# Patient Record
Sex: Female | Born: 1935 | Race: White | Hispanic: No | Marital: Married | Smoking: Never smoker
Health system: Southern US, Community
[De-identification: ages and names within clinical notes are randomized; demographics above are authoritative.]

## PROBLEM LIST (undated history)

## (undated) DIAGNOSIS — E785 Hyperlipidemia, unspecified: Secondary | ICD-10-CM

## (undated) DIAGNOSIS — K219 Gastro-esophageal reflux disease without esophagitis: Secondary | ICD-10-CM

## (undated) DIAGNOSIS — I10 Essential (primary) hypertension: Secondary | ICD-10-CM

## (undated) DIAGNOSIS — C679 Malignant neoplasm of bladder, unspecified: Secondary | ICD-10-CM

## (undated) DIAGNOSIS — F329 Major depressive disorder, single episode, unspecified: Secondary | ICD-10-CM

## (undated) DIAGNOSIS — R569 Unspecified convulsions: Secondary | ICD-10-CM

## (undated) DIAGNOSIS — I619 Nontraumatic intracerebral hemorrhage, unspecified: Secondary | ICD-10-CM

## (undated) DIAGNOSIS — M1990 Unspecified osteoarthritis, unspecified site: Secondary | ICD-10-CM

## (undated) DIAGNOSIS — F0390 Unspecified dementia without behavioral disturbance: Secondary | ICD-10-CM

## (undated) DIAGNOSIS — F32A Depression, unspecified: Secondary | ICD-10-CM

## (undated) DIAGNOSIS — I639 Cerebral infarction, unspecified: Secondary | ICD-10-CM

## (undated) DIAGNOSIS — C801 Malignant (primary) neoplasm, unspecified: Secondary | ICD-10-CM

## (undated) DIAGNOSIS — Z972 Presence of dental prosthetic device (complete) (partial): Secondary | ICD-10-CM

## (undated) HISTORY — DX: Malignant neoplasm of bladder, unspecified: C67.9

## (undated) HISTORY — PX: BACK SURGERY: SHX140

## (undated) HISTORY — PX: CHOLECYSTECTOMY: SHX55

## (undated) HISTORY — DX: Hyperlipidemia, unspecified: E78.5

## (undated) HISTORY — PX: ABDOMINAL HYSTERECTOMY: SHX81

---

## 1997-11-30 HISTORY — PX: BOWEL RESECTION: SHX1257

## 2007-06-06 ENCOUNTER — Ambulatory Visit: Payer: Self-pay | Admitting: Ophthalmology

## 2007-06-06 ENCOUNTER — Other Ambulatory Visit: Payer: Self-pay

## 2007-09-05 ENCOUNTER — Ambulatory Visit: Payer: Self-pay | Admitting: Ophthalmology

## 2007-09-27 ENCOUNTER — Ambulatory Visit: Payer: Self-pay | Admitting: Family Medicine

## 2007-10-31 ENCOUNTER — Ambulatory Visit: Payer: Self-pay | Admitting: Ophthalmology

## 2011-12-07 ENCOUNTER — Ambulatory Visit: Payer: Self-pay | Admitting: Obstetrics and Gynecology

## 2011-12-07 LAB — BASIC METABOLIC PANEL
BUN: 12 mg/dL (ref 7–18)
Calcium, Total: 8.9 mg/dL (ref 8.5–10.1)
Co2: 27 mmol/L (ref 21–32)
Creatinine: 0.57 mg/dL — ABNORMAL LOW (ref 0.60–1.30)
EGFR (African American): >60
EGFR (Non-African Amer.): >60
Glucose: 84 mg/dL (ref 65–99)
Osmolality: 282 (ref 275–301)
Potassium: 3.7 mmol/L (ref 3.5–5.1)

## 2011-12-07 LAB — CBC
HGB: 13.2 g/dL (ref 12.0–16.0)
MCH: 31.7 pg (ref 26.0–34.0)
MCV: 96 fL (ref 80–100)
RBC: 4.16 10*6/uL (ref 3.80–5.20)
WBC: 5.1 10*3/uL (ref 3.6–11.0)

## 2011-12-14 ENCOUNTER — Ambulatory Visit: Payer: Self-pay | Admitting: Obstetrics and Gynecology

## 2011-12-15 LAB — HEMOGLOBIN: HGB: 11.2 g/dL — ABNORMAL LOW (ref 12.0–16.0)

## 2012-05-03 ENCOUNTER — Ambulatory Visit: Payer: Self-pay | Admitting: Family Medicine

## 2013-11-07 ENCOUNTER — Ambulatory Visit: Payer: Self-pay | Admitting: Neurology

## 2013-11-30 HISTORY — PX: BLADDER SURGERY: SHX569

## 2013-12-30 ENCOUNTER — Emergency Department: Payer: Self-pay | Admitting: Emergency Medicine

## 2013-12-30 LAB — URINALYSIS, COMPLETE
BILIRUBIN, UR: NEGATIVE
Glucose,UR: NEGATIVE mg/dL (ref 0–75)
Ketone: NEGATIVE
LEUKOCYTE ESTERASE: NEGATIVE
Nitrite: NEGATIVE
Ph: 6 (ref 4.5–8.0)
Protein: 30
Specific Gravity: 1.008 (ref 1.003–1.030)
Squamous Epithelial: 4
WBC UR: 15 /HPF (ref 0–5)

## 2013-12-30 LAB — CBC
HCT: 40.8 % (ref 35.0–47.0)
HGB: 13.6 g/dL (ref 12.0–16.0)
MCH: 31.9 pg (ref 26.0–34.0)
MCHC: 33.4 g/dL (ref 32.0–36.0)
MCV: 96 fL (ref 80–100)
Platelet: 193 10*3/uL (ref 150–440)
RBC: 4.27 10*6/uL (ref 3.80–5.20)
RDW: 12.8 % (ref 11.5–14.5)
WBC: 3.5 10*3/uL — AB (ref 3.6–11.0)

## 2013-12-30 LAB — COMPREHENSIVE METABOLIC PANEL
ANION GAP: 5 — AB (ref 7–16)
Albumin: 3.7 g/dL (ref 3.4–5.0)
Alkaline Phosphatase: 77 U/L
BILIRUBIN TOTAL: 0.5 mg/dL (ref 0.2–1.0)
BUN: 11 mg/dL (ref 7–18)
CALCIUM: 8.7 mg/dL (ref 8.5–10.1)
Chloride: 105 mmol/L (ref 98–107)
Co2: 29 mmol/L (ref 21–32)
Creatinine: 0.82 mg/dL (ref 0.60–1.30)
EGFR (Non-African Amer.): >60
GLUCOSE: 99 mg/dL (ref 65–99)
Osmolality: 277 (ref 275–301)
POTASSIUM: 3.5 mmol/L (ref 3.5–5.1)
SGOT(AST): 26 U/L (ref 15–37)
SGPT (ALT): 29 U/L (ref 12–78)
Sodium: 139 mmol/L (ref 136–145)
TOTAL PROTEIN: 7.1 g/dL (ref 6.4–8.2)

## 2014-01-03 ENCOUNTER — Ambulatory Visit: Payer: Self-pay | Admitting: Urology

## 2014-01-04 ENCOUNTER — Ambulatory Visit: Payer: Self-pay | Admitting: Urology

## 2014-01-09 LAB — PATHOLOGY REPORT

## 2014-05-02 ENCOUNTER — Ambulatory Visit: Payer: Self-pay | Admitting: Urology

## 2014-05-02 LAB — BASIC METABOLIC PANEL
Anion Gap: 4 — ABNORMAL LOW (ref 7–16)
BUN: 11 mg/dL (ref 7–18)
CALCIUM: 8.4 mg/dL — AB (ref 8.5–10.1)
CO2: 29 mmol/L (ref 21–32)
Chloride: 107 mmol/L (ref 98–107)
Creatinine: 0.63 mg/dL (ref 0.60–1.30)
EGFR (African American): >60
GLUCOSE: 98 mg/dL (ref 65–99)
Osmolality: 279 (ref 275–301)
Potassium: 4 mmol/L (ref 3.5–5.1)
SODIUM: 140 mmol/L (ref 136–145)

## 2014-05-02 LAB — CBC
HCT: 40.1 % (ref 35.0–47.0)
HGB: 13.1 g/dL (ref 12.0–16.0)
MCH: 31.5 pg (ref 26.0–34.0)
MCHC: 32.7 g/dL (ref 32.0–36.0)
MCV: 96 fL (ref 80–100)
Platelet: 196 10*3/uL (ref 150–440)
RBC: 4.17 10*6/uL (ref 3.80–5.20)
RDW: 13 % (ref 11.5–14.5)
WBC: 4.7 10*3/uL (ref 3.6–11.0)

## 2014-05-11 ENCOUNTER — Ambulatory Visit: Payer: Self-pay | Admitting: Urology

## 2014-05-15 LAB — PATHOLOGY REPORT

## 2014-08-22 DIAGNOSIS — G309 Alzheimer's disease, unspecified: Secondary | ICD-10-CM

## 2014-08-22 DIAGNOSIS — I6381 Other cerebral infarction due to occlusion or stenosis of small artery: Secondary | ICD-10-CM | POA: Insufficient documentation

## 2014-08-22 DIAGNOSIS — K589 Irritable bowel syndrome without diarrhea: Secondary | ICD-10-CM | POA: Insufficient documentation

## 2014-08-22 DIAGNOSIS — F0150 Vascular dementia without behavioral disturbance: Secondary | ICD-10-CM | POA: Insufficient documentation

## 2014-08-22 DIAGNOSIS — E782 Mixed hyperlipidemia: Secondary | ICD-10-CM | POA: Insufficient documentation

## 2014-08-22 DIAGNOSIS — J302 Other seasonal allergic rhinitis: Secondary | ICD-10-CM | POA: Insufficient documentation

## 2014-08-22 DIAGNOSIS — C679 Malignant neoplasm of bladder, unspecified: Secondary | ICD-10-CM | POA: Insufficient documentation

## 2014-08-22 DIAGNOSIS — I10 Essential (primary) hypertension: Secondary | ICD-10-CM | POA: Insufficient documentation

## 2014-08-22 DIAGNOSIS — F0280 Dementia in other diseases classified elsewhere without behavioral disturbance: Secondary | ICD-10-CM

## 2014-12-13 LAB — URINALYSIS, COMPLETE
Bacteria: NONE SEEN
Bilirubin,UR: NEGATIVE
Glucose,UR: NEGATIVE mg/dL (ref 0–75)
Ketone: NEGATIVE
Leukocyte Esterase: NEGATIVE
NITRITE: NEGATIVE
Ph: 6 (ref 4.5–8.0)
Protein: NEGATIVE
Specific Gravity: 1.012 (ref 1.003–1.030)
Squamous Epithelial: 1
WBC UR: 1 /HPF (ref 0–5)

## 2014-12-13 LAB — CBC
HCT: 43.5 % (ref 35.0–47.0)
HGB: 14.1 g/dL (ref 12.0–16.0)
MCH: 31.3 pg (ref 26.0–34.0)
MCHC: 32.4 g/dL (ref 32.0–36.0)
MCV: 97 fL (ref 80–100)
PLATELETS: 240 10*3/uL (ref 150–440)
RBC: 4.5 10*6/uL (ref 3.80–5.20)
RDW: 13 % (ref 11.5–14.5)
WBC: 5 10*3/uL (ref 3.6–11.0)

## 2014-12-13 LAB — PROTIME-INR
INR: 0.9
PROTHROMBIN TIME: 12.3 s (ref 11.5–14.7)

## 2014-12-13 LAB — COMPREHENSIVE METABOLIC PANEL
ALK PHOS: 89 U/L
ANION GAP: 3 — AB (ref 7–16)
AST: 32 U/L (ref 15–37)
Albumin: 3.6 g/dL (ref 3.4–5.0)
BUN: 14 mg/dL (ref 7–18)
Bilirubin,Total: 0.3 mg/dL (ref 0.2–1.0)
CALCIUM: 8.7 mg/dL (ref 8.5–10.1)
Chloride: 107 mmol/L (ref 98–107)
Co2: 32 mmol/L (ref 21–32)
Creatinine: 0.77 mg/dL (ref 0.60–1.30)
EGFR (African American): >60
EGFR (Non-African Amer.): >60
Glucose: 85 mg/dL (ref 65–99)
OSMOLALITY: 283 (ref 275–301)
Potassium: 3.8 mmol/L (ref 3.5–5.1)
SGPT (ALT): 30 U/L
SODIUM: 142 mmol/L (ref 136–145)
TOTAL PROTEIN: 7.6 g/dL (ref 6.4–8.2)

## 2014-12-13 LAB — CK-MB: CK-MB: 2.5 ng/mL (ref 0.5–3.6)

## 2014-12-13 LAB — TROPONIN I
TROPONIN-I: 0.07 ng/mL — AB
Troponin-I: 0.07 ng/mL — ABNORMAL HIGH

## 2014-12-14 ENCOUNTER — Observation Stay: Payer: Self-pay | Admitting: Emergency Medicine

## 2014-12-14 LAB — CBC WITH DIFFERENTIAL/PLATELET
Basophil #: 0.1 10*3/uL (ref 0.0–0.1)
Basophil %: 1.1 %
Eosinophil #: 0.2 10*3/uL (ref 0.0–0.7)
Eosinophil %: 2.6 %
HCT: 39.6 % (ref 35.0–47.0)
HGB: 13 g/dL (ref 12.0–16.0)
LYMPHS ABS: 1.9 10*3/uL (ref 1.0–3.6)
LYMPHS PCT: 31.5 %
MCH: 31.8 pg (ref 26.0–34.0)
MCHC: 32.8 g/dL (ref 32.0–36.0)
MCV: 97 fL (ref 80–100)
MONOS PCT: 12.3 %
Monocyte #: 0.7 x10 3/mm (ref 0.2–0.9)
Neutrophil #: 3.1 10*3/uL (ref 1.4–6.5)
Neutrophil %: 52.5 %
PLATELETS: 208 10*3/uL (ref 150–440)
RBC: 4.08 10*6/uL (ref 3.80–5.20)
RDW: 12.9 % (ref 11.5–14.5)
WBC: 5.9 10*3/uL (ref 3.6–11.0)

## 2014-12-14 LAB — COMPREHENSIVE METABOLIC PANEL
ALK PHOS: 69 U/L
Albumin: 3.3 g/dL — ABNORMAL LOW (ref 3.4–5.0)
Anion Gap: 6 — ABNORMAL LOW (ref 7–16)
BILIRUBIN TOTAL: 0.4 mg/dL (ref 0.2–1.0)
BUN: 11 mg/dL (ref 7–18)
CREATININE: 0.71 mg/dL (ref 0.60–1.30)
Calcium, Total: 8.4 mg/dL — ABNORMAL LOW (ref 8.5–10.1)
Chloride: 109 mmol/L — ABNORMAL HIGH (ref 98–107)
Co2: 26 mmol/L (ref 21–32)
Glucose: 92 mg/dL (ref 65–99)
Osmolality: 280 (ref 275–301)
Potassium: 3.8 mmol/L (ref 3.5–5.1)
SGOT(AST): 22 U/L (ref 15–37)
SGPT (ALT): 25 U/L
Sodium: 141 mmol/L (ref 136–145)
Total Protein: 6.5 g/dL (ref 6.4–8.2)

## 2014-12-14 LAB — TROPONIN I: Troponin-I: 0.07 ng/mL — ABNORMAL HIGH

## 2014-12-14 LAB — CK-MB: CK-MB: 2.7 ng/mL (ref 0.5–3.6)

## 2015-01-02 DIAGNOSIS — R55 Syncope and collapse: Secondary | ICD-10-CM | POA: Insufficient documentation

## 2015-01-02 DIAGNOSIS — Z86718 Personal history of other venous thrombosis and embolism: Secondary | ICD-10-CM | POA: Insufficient documentation

## 2015-01-02 DIAGNOSIS — R001 Bradycardia, unspecified: Secondary | ICD-10-CM | POA: Insufficient documentation

## 2015-01-02 DIAGNOSIS — I340 Nonrheumatic mitral (valve) insufficiency: Secondary | ICD-10-CM | POA: Insufficient documentation

## 2015-03-19 ENCOUNTER — Ambulatory Visit
Admit: 2015-03-19 | Disposition: A | Discharge status: Home / Self Care | Payer: Self-pay | Attending: Urology | Admitting: Urology

## 2015-03-21 LAB — URINE CULTURE

## 2015-03-23 NOTE — Op Note (Signed)
PATIENT NAME:  Gabrielle Crosby, Gabrielle Crosby MR#:  [id number] DATE OF BIRTH:  [birth year]  DATE OF PROCEDURE:  05/11/2014  PREOPERATIVE DIAGNOSIS: Recurrent bladder tumor.   POSTOPERATIVE DIAGNOSIS: Recurrent bladder tumor.   PROCEDURE: Cystoscopy with small bladder tumor removal with fulguration.   SURGEON: Richard D. Elnoria Howard, DO  ANESTHESIA: General.   COMPLICATIONS: None.   SPECIMEN: One.   DESCRIPTION OF PROCEDURE: The patient was sterilely prepped and draped in supine lithotomy position and under good relaxation from a general anesthetic. A timeout is done and is agreed to by all parties. The patient then has a cystoscopy done. Tumor seen in the right lateral wall. It was biopsied with a cold cutting biopsy forceps, fulgurated with a Bugbee electrode so that there is no bleeding. The area is extensively fulgurated. The ureters are identified. There is some cystitis cystica in the bladder which is nondiagnostic for tumor. So, the patient tolerates the procedure well, and bladder is emptied and 30 mL of 0.5% Marcaine is placed in the bladder, and she is sent to recovery in satisfactory condition with a B and O suppository.    ____________________________ Janice Coffin. Elnoria Howard, [employer] rdh:lb D: 05/11/2014 12:53:04 ET T: 05/11/2014 13:05:28 ET JOB#: [id number]  cc: Janice Coffin. Elnoria Howard, DO, <Dictator> RICHARD D HART DO ELECTRONICALLY SIGNED 05/11/2014 15:42

## 2015-03-23 NOTE — Op Note (Signed)
PATIENT NAME:  Gabrielle Crosby, Gabrielle Crosby MR#:  [id number] DATE OF BIRTH:  08/22/1933  DATE OF PROCEDURE:  01/04/2014  PREOPERATIVE DIAGNOSIS: Right lateral wall bladder tumor.   POSTOPERATIVE DIAGNOSIS: Right lateral wall bladder tumor.   PROCEDURE: Cystoscopy with transurethral resection of bladder tumor and instillation of mitomycin-C 40 mg in 40 mL.  SURGEON: Nevae Pinnix D. Elnoria Howard, DO  DESCRIPTION OF PROCEDURE: With the patient sterilely prepped and draped in supine lithotomy position under good relaxation from a general anesthetic, and after an appropriate timeout, we began the procedure. Female, we used a saline resectoscope utilizing direct vision to enter the bladder. The obturator was then removed from the resectoscope. The 43 French resectoscope was then utilized with constant irrigation and suction. Normal saline irrigation was utilized. Two tumors were seen, one just proximal to the ureteral orifice on the interureteric ridge. 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 Then, a large carcinoma about 2.5 cm to 3 cm was seen lateral to this, behind the bladder neck, about halfway cephalad on the right lateral wall. It is resected easily. It does not appear to be too deep. I do penetrate into fat. I do get muscle. The areas are thoroughly coagulated. The irrigation is clear, so we used some mitomycin-C after irrigating all tumor fragments out of the bladder with suction irrigation. She is sent to recovery in satisfactory condition after the mitomycin is placed through a 24 [place] 2-way Foley. Irrigation was clear before we put in the mitomycin-C. The Foley is plugged. She will be seen in recovery and have it unplugged within the next hour.   ____________________________ Janice Coffin. Elnoria Howard, DO rdh:lb D: 01/04/2014 12:13:16 ET T: 01/04/2014 13:03:18 ET JOB#: [id number]  cc: Janice Coffin. Elnoria Howard, DO, <Dictator> Milianna Ericsson D Obera Stauch  DO ELECTRONICALLY SIGNED 02/02/2014 14:25

## 2015-03-24 NOTE — H&P (Signed)
PATIENT NAME:  Gabrielle Crosby, Gabrielle Crosby MR#:  [id number] DATE OF BIRTH:  [birth year]  DATE OF ADMISSION:  12/14/2011  CHIEF COMPLAINT: Pelvic organ prolapse (large rectocele and enterocele).   HISTORY: Gabrielle Crosby is a 81 year old married white female, para 2-0-0-2, status post total abdominal hysterectomy, status post laparoscopic bladder neck suspension in 1998, status post exploratory laparotomy with bilateral salpingo-oophorectomy for suspected small bowel obstruction in 1998, who presents for surgical evaluation of pelvic organ prolapse. The patient has had greater than a month's worth of symptoms of low pelvic pain and pressure sensation. She has had some urinary frequency, approximately 15 times a day without nocturia. She has mild stress incontinence symptoms and some urgency symptoms. The patient does wear pads for protection. She denies history of incomplete bladder emptying. The patient does report changes in bowel function including incomplete emptying of her bowels. She does not completely evacuate. She does have to splint for bowel movements. Work-up has revealed a large rectocele and enterocele.   PAST MEDICAL HISTORY:  1. Hypertension.  2. Hyperlipidemia.  3. Irritable bowel syndrome.  4. Deep vein thrombosis.  5. Seasonal allergic rhinitis.   PAST SURGICAL HISTORY:  1. Total vaginal hysterectomy.  2. Bladder tack.  3. Exploratory laparotomy with bilateral salpingo-oophorectomy.  4. Cholecystectomy.   PAST OB HISTORY: Para 2-0-0-2, spontaneous vaginal delivery x2.   PAST GYN HISTORY: No history of abnormal Pap smears.   ALLERGIES:  No known drug allergies.  MEDICATIONS:  1. Atenolol 50 mg daily. 2. Flonase 2  sprays both nostrils once a day.  3. Lovastatin 20 mg a day.   FAMILY HISTORY: Negative for cancer of the colon, ovary, or breast. There is heart disease in mom and two brothers.   SOCIAL HISTORY: The patient does not smoke, does not drink. 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  REVIEW OF SYSTEMS: The patient denies recent illness. She denies recent reactive airway disease. She denies coagulopathy.   PHYSICAL EXAMINATION:  VITAL SIGNS: Height 5 feet 2 inches, weight 128, blood pressure 158/68, heart rate 63.   GENERAL: The patient is a pleasant white female in no acute distress. She is alert and oriented. Affect is appropriate.   HEENT: Oropharynx is clear.   NECK: Supple. No thyromegaly or adenopathy.   LUNGS: Clear.   HEART: Regular rate and rhythm.   BACK: Without CVA tenderness.   ABDOMEN: Soft and nontender. No organomegaly or pelvic masses. There are two well-healed midline incisions in the lower abdomen.   PELVIC: External genitalia with mild atrophic changes. BUS notable for a small urethral caruncle. The introitus is notable for a bulge being present when the labia are spread. Rectocele is present on clinical exam with Valsalva worsening the rectocele. There appears to be good support of the urethrovesical angle. A first-degree cystocele is present. The depth of the vagina appears to be slightly foreshortened. Bimanual exam reveals no masses.   EXTREMITIES: Without clubbing, cyanosis, or edema.   SKIN: Without rash.   IMPRESSION:  1. Pelvic organ prolapse with symptomatic large rectocele and enterocele.  2. History of laparoscopic bladder neck suspension with history of complications requiring another surgery.  3. History of postoperative deep vein thrombosis.  4. Currently with good distal bladder neck support and presence of a small cystocele.   PLAN: Posterior colporrhaphy with enterocele ligation. Date of surgery is 12/14/2011.   CONSENT NOTE: [name] is to undergo vaginal surgery to repair rectocele and enterocele. Posterior colporrhaphy and enterocele  ligation is planned. She is understanding of the planned procedures and is aware of and is accepting of all surgical risks which include but are not  limited to bleeding, infection, pelvic organ injury with need for repair, blood clot disorders, anesthesia risks, and death. All of her questions are answered. Informed consent is given. The patient is ready and willing to proceed with surgery as scheduled.   ____________________________ Alanda Slim DeFrancesco, MD mad:cbb D: 12/09/2011 17:17:12 ET T: 12/09/2011 18:09:35 ET JOB#: [id number]  cc: Hassell Done A. DeFrancesco, MD, <Dictator> Alanda Slim DEFRANCESCO MD ELECTRONICALLY SIGNED 12/28/2011 12:43

## 2015-03-24 NOTE — Op Note (Signed)
PATIENT NAME:  Gabrielle Crosby, Gabrielle Crosby MR#:  [id number] DATE OF BIRTH:  08-Aug-1933  DATE OF PROCEDURE:  12/14/2011  PREOPERATIVE DIAGNOSES: Symptomatic pelvic relaxation (rectocele, enterocele).   POSTOPERATIVE DIAGNOSES: Symptomatic pelvic relaxation (rectocele, enterocele).   OPERATIVE PROCEDURE: Posterior colporrhaphy with enterocele ligation.   SURGEON: Alanda Slim. Ani Deoliveira, MD   FIRST ASSISTANT: Shelby Mattocks. Knowles-Jonas, MD    ANESTHESIA: General endotracheal.   INDICATIONS: The patient is a 81 year old white female, status post hysterectomy in the past, who presents for surgical management of symptomatic pelvic relaxation in the form of a rectocele and enterocele which has been causing pelvic pressure symptoms.   FINDINGS AT SURGERY: Findings at surgery revealed a moderate rectocele and enterocele.   DESCRIPTION OF PROCEDURE: The patient was brought to the Operating Room where she was placed in the supine position. General endotracheal anesthesia was induced without difficulty. She was placed in a dorsal lithotomy position, and a Betadine perineal-intravaginal prep and drape was performed in the standard fashion. The patient had been previously treated with Jobst hose and sequential compression device stockings. The clinical findings were noted on exam under anesthesia. Allis clamps were used to grasp the angles at the introitus. A diamond-shaped wedge tissue comprising the posterior fourchette involving both vaginal and perineal aspects was excised with a scalpel. The vaginal mucosa was then undermined with Metzenbaum scissors and incised in the midline. Allis-Adair retractors were used to facilitate exposure and dissection. This process was carried out up to the apex of the vagina. Sequentially, the perirectal fascia was dissected off of the vaginal sidewall using sharp and blunt dissection in a standard technique. The enterocele sac was identified high in the vagina. Eventually the enterocele  sac was opened and explored. This exploration verified no significant adhesions present with the bowel. The enterocele sac was closed using a pursestring stitch of 0 Vicryl suture. Next, the rectocele was reduced using horizontal mattress stitches of 0 Vicryl. Good perineal shelf was created. Vaginal mucosa was trimmed and was reapproximated in the midline using 2-0 chromic sutures. A vaginal pack of Kerlix and Premarin was then placed. A Foley catheter was placed. Initially, some hematuria was noted which subsequently resolved. Upon completion of the procedure, the patient was awakened, extubated, and taken to the recovery room in satisfactory condition.   ESTIMATED BLOOD LOSS: 25 mL.   COMPLICATIONS: None.   IV FLUIDS INFUSED: 750 mL.   ____________________________ Alanda Slim. Laramie Meissner, MD mad:cbb D: 12/15/2011 11:14:13 ET T: 12/15/2011 13:36:24 ET JOB#: [id number]  cc: Hassell Done A. Abrar Koone, MD, <Dictator> Shelby Mattocks. Georga Bora, MD Alanda Slim Georganne Siple MD ELECTRONICALLY SIGNED 12/28/2011 12:43

## 2015-03-27 ENCOUNTER — Ambulatory Visit
Admit: 2015-03-27 | Disposition: A | Discharge status: Home / Self Care | Payer: Self-pay | Attending: Urology | Admitting: Urology

## 2015-03-31 NOTE — H&P (Signed)
PATIENT NAME:  Gabrielle Crosby, BIDWELL MR#:  [id number] DATE OF BIRTH:  30-Jul-1933  DATE OF ADMISSION:  12/13/2014  PRIMARY CARE PHYSICIAN:  DR Ilene Qua.   The patient is a 81 year old Caucasian female with history of dementia, history of hypertension, hyperlipidemia, who presents to the hospital with complaints of dizziness and some memory loss and problems such as dizziness, lightheadedness, feeling like she was going to pass out as well as some memory loss.  According to the patient's husband as well as the patient, she has been having dizziness and lightheadedness episodes for the past 1 week. The episodes would come and go and they would last maybe 30 to 40 seconds.  The last one which was today was very intense, apparently came on while she was sitting on the couch and she just leaned her head back, but she did not pass out.  Post dizziness episode, said she was a little bit more confused than usual.  She also feels somewhat presyncopal during those episodes, but no full syncope was ever noted.  Here in the Emergency Room, she also had an episode which lasted just 2 seconds.  During this episode, I came into her room and her heart rate was in the 40s.   PAST MEDICAL HISTORY: Significant for history of bilateral cataract removal, history of hypertension, hyperlipidemia, irritable bowel syndrome history of DVT, history of recurrent bladder tumor status post fulguration in the past, history of seasonal allergic rhinitis, total vaginal hysterectomy, bladder tack, history of rectocele, enterocele operation,  laparotomy with bilateral salpingo-oophorectomy and cholecystectomy.   ALLERGIES: None.   MEDICATIONS: According to medical records, the patient is on aspirin 81 mg daily, atenolol 50 mg daily, lovastatin 10 mg p.o. daily at bedtime.   FAMILY HISTORY: Negative for colon or breast cancer, heart disease in the patient's mother as well as 2 brothers.   SOCIAL HISTORY:  The patient has no history of smoking  or alcohol abuse. 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   REVIEW OF SYSTEMS:  Admits to lightheadedness and dizziness, feeling presyncopal episodes, feeling confused and near syncopal during those episodes.   CONSTITUTIONAL:  Denies any fevers, chills, fatigue, weakness, pains, weight loss or gain.  EYES: Denies any blurry vision, double vision or glaucoma.  ENT: Denies any tinnitus, allergies, epistaxis, sinus pain, dentures, difficulty swallowing.  RESPIRATORY: Denies any cough, wheezes, asthma, COPD.  CARDIOVASCULAR:  Denies chest pains, orthopnea, edema, palpitations.  GASTROINTESTINAL: Denies nausea, vomiting, diarrhea or constipation.   GENITOURINARY: Denies dysuria, hematuria, frequency, incontinence.  ENDOCRINOLOGY: Denies any polydipsia, nocturia, thyroid problems, heat or cold intolerance or thirst.  HEMATOLOGIC: Denies anemia, easy bruising or bleeding, swollen glands.  SKIN: Denies any acne, rashes, change in moles.  MUSCULOSKELETAL: Denies arthritis, cramps, swelling, gout .  NEUROLOGIC: Denies numbness, epilepsy or tremor.  PSYCHIATRIC: Denies anxiety as well as depression.  PHYSICAL EXAMINATION: VITAL SIGNS: On arrival to the hospital, the patient's temperature was 98, pulse was 74, respiratory rate was 18, blood pressure 160/77, saturation was 98% on room air.  GENERAL: This is a well-developed, well-nourished, thin, pale, Caucasian female in no significant distress, sitting on the stretcher.  HEENT: Her pupils are equal and reactive to light. Extraocular muscles intact. No icterus or conjunctivitis. Has normal hearing. No pharyngeal erythema. Mucosa is moist.  NECK: No masses. Supple, nontender. Thyroid not enlarged. No adenopathy. No JVD or carotid bruits bilaterally. Full range of motion.  LUNGS: Clear to auscultation in all fields. No rales,  rhonchi, diminished breath sounds or wheezing. No labored respirations, increased effort, dullness to percussion or  overt respiratory distress.  CARDIOVASCULAR: S1, S2 appreciated. Rhythm is regular. PMI not lateralized. Chest is nontender to palpation. 1+ pedal pulses. No lower extremity edema, calf tenderness or cyanosis was noted.  ABDOMEN: Soft, nontender. Bowel sounds are present. No hepatosplenomegaly or masses were noted.  RECTAL: Deferred.  MUSCLE STRENGTH: Able to move all extremities. No cyanosis, degenerative joint disease or kyphosis. Gait was not tested.  SKIN: Did not reveal any rashes, lesions, erythema, nodularity, induration. It was warm and dry to palpation.  LYMPHATIC: No adenopathy in the cervical region.  NEUROLOGICAL: Cranial nerves grossly intact. Sensory is intact. No dysarthria or aphasia. PSYCHIATRIC: The patient is alert and oriented to time, person and place. Cooperative. Memory is somewhat impaired, but no significant confusion, agitation or depression was noted.   LABORATORY DATA: BMP within normal limits. Liver enzymes were normal. Cardiac enzymes:  Troponin is elevated at 0.07. CBC: White blood cell count was 5.0, hemoglobin was 14.1, platelet count was 240,000. Coagulation panel was unremarkable. Urinalysis:  Yellow clear urine.  Negative for glucose, bilirubin or ketones. Specific gravity 1.012, pH was 6.0, 1+ blood; negative for protein, nitrites or leukocyte esterase, 6 red blood cells, 1 white blood cell, no bacteria, 1 epithelial cell as well as mucus was present.   RADIOLOGIC STUDIES: CT scan of head without contrast 12/13/2014 showed no acute intracranial pathology; chronic microvascular disease and cerebral atrophy was noted. EKG showed normal sinus rhythm at 63 beats per minutes, normal axis, no acute ST-T changes, minimal voltage criteria for LVH, which may be normal variant according to EKG criteria.   ASSESSMENT AND PLAN:  1. Dizziness, presyncopal episode.  Etiology at this time could be related to bradycardia episodes.  Cannot rule out cardiac ischemia. We will get  neurology and cardiology involved.  We will get echocardiogram, carotid ultrasound, orthostatic vital signs. We will hold the patient's atenolol.  We will get TSH.  2. Elevated troponin, questionable ischemic versus demand related.  We will get echocardiogram and cardiology consultation.  We will start the patient on aspirin and we will start her on nitroglycerin topically, as well as a beta blockers at this time.  3. Hyperlipidemia. Continue lovastatin. We will get lipid panel in the morning.  4. Hypertension. Will hold atenolol. We will start the patient on lisinopril.   TIME SPENT: 50 minutes.    ____________________________ Theodoro Grist, MD rv:by D: 12/13/2014 19:57:10 ET T: 12/13/2014 21:01:17 ET JOB#: [id number]  cc: Theodoro Grist, MD, <Dictator> Unknown cc Fifi Schindler MD ELECTRONICALLY SIGNED 12/20/2014 10:32

## 2015-03-31 NOTE — Discharge Summary (Signed)
PATIENT NAME:  Gabrielle Crosby, Gabrielle Crosby MR#:  [id number] DATE OF BIRTH:  16-May-1933  DATE OF ADMISSION:  12/14/2014 DATE OF DISCHARGE:  12/15/2014  ADMITTING DIAGNOSIS:  1. Near syncope and brief episode of confusion, possibly related to bradycardia and near syncope. No evidence of seizure.  2. Bradycardia, felt to be due to atenolol therapy. Heart rate now stable.  3. History of bilateral cataract removal.  4. History of hypertension.  5. Hyperlipidemia.  6. Irritable bowel syndrome.  7. History of deep vein thrombosis.  8. History of recurrent bladder tumor status post fulguration.  9. History of seasonal allergies.  10. History of total vaginal hysterectomy, bladder tack.  11. Bilateral salpingo-oophorectomy.  12. Status post cholecystectomy.   CONSULTANTS: Dr. Tamala Julian as well as Dr. Nehemiah Massed.   PERTINENT LABORATORY EVALUATIONS: Admitting glucose 85, BUN 14, creatinine 0.77, sodium 142, potassium 3.8, chloride 107, CO2 of 32, calcium 8.6. LFTs are normal. Troponin 0.07, 0.07, 0.07. WBC 5, hemoglobin 14.1, platelet count 240,000. INR was 0.9. Urinalysis was negative. Echocardiogram of the heart showed EF 60% to 65%, mildly dilated right atrium, mild-to-moderate tricuspid regurgitation, mildly increased left ventricular posterior wall thickness. CT of the head without contrast showed no acute abnormality. Carotid Dopplers showed bilateral plaque without any significant stenosis, which was minimal plaque.   HOSPITAL COURSE: Please refer to H and P done by the admitting physician. The patient is a 81 year old white female who was brought to the hospital where she started feeling that she was going to pass out, and then also was briefly confused or disoriented. Due to these symptoms, she was brought to the ED. The patient was admitted for near syncope. She was noted to have a heart rate in the 40s initially. Her atenolol was held and further work-up was pursued. She underwent evaluation with neurology and  cardiology. There was no evidence of seizure. She could have had a near syncope. The patient's heart rate improved after stopping the atenolol. At this time, she is doing much better and is stable for discharge.   DISCHARGE MEDICATIONS: Lovastatin 20 daily, aspirin 81 mg 1 tab p.o. daily, lisinopril 5 p.o. daily.   DIET: Low-sodium, low-fat, low-cholesterol.   ACTIVITY: As tolerated.   FOLLOWUP: With primary MD in 1 to 2 weeks, with Dr. Nehemiah Massed in 2 to 4 weeks.   TIME SPENT: 35 minutes on this patient.      ____________________________ Lafonda Mosses. Posey Pronto, MD shp:lm D: 12/15/2014 12:55:28 ET T: 12/16/2014 00:35:12 ET JOB#: [id number]  cc: Roosvelt Churchwell H. Posey Pronto, MD, <Dictator> Alric Seton MD ELECTRONICALLY SIGNED 12/19/2014 14:41

## 2015-03-31 NOTE — Consult Note (Signed)
PATIENT NAME:  Gabrielle Crosby, Gabrielle Crosby MR#:  [id number] DATE OF BIRTH:  November 23, 1933  DATE OF CONSULTATION:  12/14/2014  CONSULTING PHYSICIAN:  Valentino Nose, MD CONSULTED PHYSICIAN:  Corey Skains, MD   REASON FOR CONSULTATION: Syncope, bradycardia, dizziness, elevated troponin.   CHIEF COMPLAINT: "I passed out."   HISTORY OF PRESENT ILLNESS:  A 81 year old female with known mixed hyperlipidemia and essential hypertension, who has had significant weakness and dizziness over the last several weeks, who had an episode of syncope without injury.  This syncope lasted for approximately 1 minute and the patient regained consciousness without evidence of chest discomfort or other heart failure-type symptoms.  She was mildly short of breath.  The patient was seen in the Emergency Room with sinus bradycardia into the 40 beats per minute range, but has previously been on atenolol.  The patient had an elevated troponin of 0.07 more consistent with demand ischemia, rather than acute myocardial infarction.  Currently, the patient feels somewhat better.   REVIEW OF SYSTEMS: The remainder review of systems negative for vision change, ringing in the ears, hearing loss, cough, congestion, heartburn, nausea, vomiting, diarrhea, bloody stools, stomach pain, extremity pain, leg weakness, cramping of the buttocks, known blood clots, headaches, nosebleeds, congestion, trouble swallowing, frequent urination, urination at night, muscle weakness, numbness, anxiety, depression, skin lesions or skin rashes.   PAST MEDICAL HISTORY:  1.  Essential hypertension.  2.  Hyperlipidemia.   FAMILY HISTORY: No family members with early onset of cardiovascular disease or hypertension.   SOCIAL HISTORY: Currently denies alcohol or tobacco use.   ALLERGIES: As listed.   MEDICATIONS: As listed.   PHYSICAL EXAMINATION:  VITAL SIGNS: Blood pressure is 122/68 bilaterally. Heart rate is 52 upright, reclining, and regular.  GENERAL: She  is a well-appearing female in no acute distress.  HEENT: No icterus, thyromegaly, ulcers, hemorrhage, or xanthelasma.  CARDIOVASCULAR: Regular rate and rhythm with normal S1 and S2 with no murmur, gallop, or rub. PMI is normal size and placement. Carotid upstroke normal without bruit. Jugular venous pressure is normal.  LUNGS:  Clear to auscultation with normal respirations.  ABDOMEN: Soft, nontender, without hepatosplenomegaly or masses. Abdominal aorta is normal size without bruit.  EXTREMITIES: 2+ radial, femoral, dorsal pedal pulses, with no lower extremity edema, cyanosis, clubbing or ulcers.  NEUROLOGIC: She is oriented to time, place, and person, with normal mood and affect.   ASSESSMENT: A 81 year old female with mixed hyperlipidemia, essential hypertension and elevated troponin and bradycardia with syncope.   RECOMMENDATIONS:  1.  Discontinuation of atenolol and abstain from it completely due to bradycardia.  2.  Continue treatment of essential hypertension with ACE inhibitor and or calcium channel blocker.  3.  Mixed hyperlipidemia. Treatment with statin with moderate to high intensity cholesterol therapy.  4.  Begin ambulation and follow for further significant issues and or treatment.  5.  Echocardiogram for LV systolic dysfunction, valvular heart disease causing above. 7.  With ambulation. if no further significant symptoms, possible discharge to home.     ___________________________ Corey Skains, MD bjk:DT D: 12/15/2014 08:59:31 ET T: 12/15/2014 11:57:53 ET JOB#: [id number]  cc: Corey Skains, MD, <Dictator> Corey Skains MD ELECTRONICALLY SIGNED 12/17/2014 13:27

## 2015-03-31 NOTE — Consult Note (Signed)
Referring Physician:  Debbrah Alar   Primary Care Physician:  Debbrah Alar : [facility], [facility] of [place], [place], [place] [address], [phone]  Reason for Consult: Admit Date: 14-Dec-2014  Chief Complaint: syncope  Reason for Consult: syncope   History of Present Illness: History of Present Illness:   81 yo RHD F presents after passing out.  This was witnessed without convulsions but pt does not remember episode.  Pt has had plenty of episodes in the past.  No incontinence or tongue biting.  Pt does have a funny feeling in her chest but no pain or N/V or diaphoresis with episodes.  Pt was sitting with this last episode and was confused for a little after.  ROS:  General denies complaints   HEENT no complaints   Lungs no complaints   Cardiac no complaints   GI no complaints   GU no complaints   Musculoskeletal no complaints   Extremities no complaints   Skin no complaints   Neuro no complaints   Endocrine no complaints   Psych no complaints   Past Medical/Surgical Hx:  short term memory loss:   Swallowing difficulty:   Hypertension:   Migraines:   Cholecystectomy:   Vaginal Hysterectomy:   Past Medical/ Surgical Hx:  Past Medical History reviewed by me as above   Past Surgical History reviewed by me as above   Home Medications: Medication Instructions Last Modified Date/Time  atenolol 50 mg oral tablet 1 tab(s) orally once a day (in the morning) 15-Jan-16 11:16  lovastatin 20 mg oral tablet 1 tab(s) orally once a day (at bedtime) 15-Jan-16 11:16  aspirin 81 mg oral tablet 1 tab(s) orally once a day (at bedtime) 15-Jan-16 11:16   Allergies:  No Known Allergies:   Allergies:  Allergies NKDA   Social/Family History: Employment Status: retired  Lives With: spouse  Living Arrangements: house  Social History: no tob, no EtOh, no illicits  Family History: no seizures, no stroke   Physical Exam: General: nl  weight, NAD  HEENT: normocephalic, sclera nonicteric, oropharynx clear  Neck: supple, no JVD, no bruits  Chest: CTA B, no wheezing  Cardiac: RRR, no murmurs, no edema, 2+ pulses  Extremities: no C/C/E, FROM   Neurologic Exam: Mental Status: alert but oriented only to person, no aphasia, follows simple commands  Cranial Nerves: PERRLA, EOMI, nl VF, face symmetric, tongue midline, shoulder shrug equal  Motor Exam: 5/5 B normal, tone, no tremor  Deep Tendon Reflexes: 1+/4 B, plantars downgoing  Sensory Exam: temp intact  Coordination: FTN and HTS WNLCT   Lab Results: Hepatic:  15-Jan-16 02:54   Bilirubin, Total 0.4  Alkaline Phosphatase 69 (46-116 NOTE: New Reference Range 06/19/14)  SGPT (ALT) 25 (14-63 NOTE: New Reference Range 06/19/14)  SGOT (AST) 22  Total Protein, Serum 6.5  Albumin, Serum  3.3  Routine Chem:  15-Jan-16 02:54   Result Comment TROPONIN - RESULTS VERIFIED BY REPEAT TESTING.  - #[id number] AT 1742 ON 12/13/14 BY  - SDR/EKM.  Result(s) reported on 14 Dec 2014 at 04:06AM.  Glucose, Serum 92  BUN 11  Creatinine (comp) 0.71  Sodium, Serum 141  Potassium, Serum 3.8  Chloride, Serum  109  CO2, Serum 26  Calcium (Total), Serum  8.4  Osmolality (calc) 280  eGFR (African American) >60  eGFR (Non-African American) >60 (eGFR values <2m/min/1.73 m2 may be an indication of chronic kidney disease (CKD). Calculated eGFR, using the MRDR Study equation, is useful  in  patients with stable renal function. The eGFR calculation will not be reliable in acutely ill patients when serum creatinine is changing rapidly. It is not useful in patients on dialysis. The eGFR calculation may not be applicable to patients at the low and high extremes of body sizes, pregnant women, and vegetarians.)  Anion Gap  6  Cardiac:  15-Jan-16 02:54   Troponin I  0.07 (0.00-0.05 0.05 ng/mL or less: NEGATIVE  Repeat testing in 3-6 hrs  if clinically indicated. >0.05 ng/mL: POTENTIAL   MYOCARDIAL INJURY. Repeat  testing in 3-6 hrs if  clinically indicated. NOTE: An increase or decrease  of 30% or more on serial  testing suggests a  clinically important change)  CPK-MB, Serum 2.7 (Result(s) reported on 14 Dec 2014 at 04:01AM.)  Routine UA:  14-Jan-16 17:45   Color (UA) Yellow  Clarity (UA) Clear  Glucose (UA) Negative  Bilirubin (UA) Negative  Ketones (UA) Negative  Specific Gravity (UA) 1.012  Blood (UA) 1+  pH (UA) 6.0  Protein (UA) Negative  Nitrite (UA) Negative  Leukocyte Esterase (UA) Negative (Result(s) reported on 13 Dec 2014 at [facility].)  RBC (UA) 6 /HPF  WBC (UA) 1 /HPF  Bacteria (UA) NONE SEEN  Epithelial Cells (UA) 1 /HPF  Mucous (UA) PRESENT (Result(s) reported on 13 Dec 2014 at [facility].)  Routine Coag:  14-Jan-16 16:09   Prothrombin 12.3  INR 0.9 (INR reference interval applies to patients on anticoagulant therapy. A single INR therapeutic range for coumarins is not optimal for all indications; however, the suggested range for most indications is 2.0 - 3.0. Exceptions to the INR Reference Range may include: Prosthetic heart valves, acute myocardial infarction, prevention of myocardial infarction, and combinations of aspirin and anticoagulant. The need for a higher or lower target INR must be assessed individually. Reference: The Pharmacology and Management of the Vitamin K  antagonists: the seventh ACCP Conference on Antithrombotic and Thrombolytic Therapy. RDEYC.1448 Sept:126 (3suppl): N9146842. A HCT value >55% may artifactually increase the PT.  In one study,  the increase was an average of 25%. Reference:  "Effect on Routine and Special Coagulation Testing Values of Citrate Anticoagulant Adjustment in Patients with High HCT Values." American Journal of Clinical Pathology 2006;126:400-405.)  Routine Hem:  15-Jan-16 04:08   WBC (CBC) 5.9  RBC (CBC) 4.08  Hemoglobin (CBC) 13.0  Hematocrit (CBC) 39.6  Platelet Count (CBC) 208  MCV  97  MCH 31.8  MCHC 32.8  RDW 12.9  Neutrophil % 52.5  Lymphocyte % 31.5  Monocyte % 12.3  Eosinophil % 2.6  Basophil % 1.1  Neutrophil # 3.1  Lymphocyte # 1.9  Monocyte # 0.7  Eosinophil # 0.2  Basophil # 0.1 (Result(s) reported on 14 Dec 2014 at 04:30AM.)   Radiology Results: CT:    14-Jan-16 16:23, CT Head Without Contrast  CT Head Without Contrast   REASON FOR EXAM:    confusion, ams  COMMENTS:       PROCEDURE: CT  - CT HEAD WITHOUT CONTRAST  - Dec 13 2014  4:23PM     CLINICAL DATA:  Dizziness, memory loss    EXAM:  CT HEAD WITHOUT CONTRAST    TECHNIQUE:  Contiguous axial images were obtained from the base of the skull  through the vertex without intravenous contrast.    COMPARISON:  MRI brain 11/07/2013  FINDINGS:  There is no evidence of mass effect, midline shift, or extra-axial  fluid collections. There is no evidence of a space-occupying lesion  or intracranial hemorrhage. There is no evidence of a cortical-based  area of acute infarction. 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 There is generalized cerebral  atrophy. There is periventricular white matter low attenuation  likely secondary to microangiopathy.    The ventricles and sulci are appropriate for the patient's age. The  basal cisterns are patent.    Visualized portions of the orbits are unremarkable. The visualized  portions of the paranasal sinuses and mastoid air cells are  unremarkable. Cerebrovascular atherosclerotic calcifications are  noted.    The osseous structures are unremarkable.     IMPRESSION:  1. No acute intracranial pathology.  2. Chronic microvascular disease and cerebral atrophy.      Electronically Signed    By: Kathreen Devoid    On: 12/13/2014 16:39         Verified By: Jennette Banker, M.D., MD   Radiology Impression: Radiology Impression: CT of head personally reviewed by me and shows mild atrophy and WM changes    Impression/Recommendations: Recommendations:   prior notes reviewed by me  reviewed by me   Seizure vs. syncope- by history it is difficult to tell but there are some indicators of seizures EEG, if not available within 24 hours will just give treatment trial f/u bradycardia no driving or operating heavy machinery x 6 months, will follow  Electronic Signatures: Jamison Neighbor (MD)  (Signed 15-Jan-16 11:33)  Authored: REFERRING PHYSICIAN, Primary Care Physician, Consult, History of Present Illness, Review of Systems, PAST MEDICAL/SURGICAL HISTORY, HOME MEDICATIONS, ALLERGIES, Social/Family History, Physical Exam-, LAB RESULTS, RADIOLOGY RESULTS, Recommendations   Last Updated: 15-Jan-16 11:33 by Jamison Neighbor (MD)

## 2015-04-12 ENCOUNTER — Other Ambulatory Visit: Payer: Self-pay

## 2015-04-12 DIAGNOSIS — N329 Bladder disorder, unspecified: Secondary | ICD-10-CM | POA: Diagnosis not present

## 2015-04-12 DIAGNOSIS — R413 Other amnesia: Secondary | ICD-10-CM | POA: Diagnosis not present

## 2015-04-12 DIAGNOSIS — Z79899 Other long term (current) drug therapy: Secondary | ICD-10-CM | POA: Diagnosis not present

## 2015-04-12 DIAGNOSIS — Z8249 Family history of ischemic heart disease and other diseases of the circulatory system: Secondary | ICD-10-CM | POA: Diagnosis not present

## 2015-04-12 DIAGNOSIS — I10 Essential (primary) hypertension: Secondary | ICD-10-CM | POA: Diagnosis not present

## 2015-04-12 DIAGNOSIS — C679 Malignant neoplasm of bladder, unspecified: Secondary | ICD-10-CM | POA: Diagnosis present

## 2015-04-12 DIAGNOSIS — Z90710 Acquired absence of both cervix and uterus: Secondary | ICD-10-CM | POA: Diagnosis not present

## 2015-04-12 NOTE — Patient Instructions (Addendum)
  Your procedure is scheduled on: [facility] Apr 17, 2015 Report to Same Day Surgery . To find out your arrival time please call [phone] between 1PM - 3PM on TUESDAY MAY, 17, 2016.  Remember: Instructions that are not followed completely may result in serious medical risk, up to and including death, or upon the discretion of your surgeon and anesthesiologist your surgery may need to be rescheduled.    __X__ 1. Do not eat food or drink liquids after midnight. No gum chewing or hard candies.     __X__ 2. No Alcohol for 24 hours before or after surgery.   ____ 3. Bring all medications with you on the day of surgery if instructed.    __X__ 4. Notify your doctor if there is any change in your medical condition     (cold, fever, infections).     Do not wear jewelry, make-up, hairpins, clips or nail polish.  Do not wear lotions, powders, or perfumes. You may wear deodorant.  Do not shave 48 hours prior to surgery. Men may shave face and neck.  Do not bring valuables to the hospital.    [facility] is not responsible for any belongings or valuables.               Contacts, dentures or bridgework may not be worn into surgery.  Leave your suitcase in the car. After surgery it may be brought to your room.  For patients admitted to the hospital, discharge time is determined by your                treatment team.   Patients discharged the day of surgery will not be allowed to drive home.   Please read over the following fact sheets that you were given:      ____ Take these medicines the morning of surgery with A SIP OF WATER: NONE    ____ Fleet Enema (as directed)   ____ Use CHG Soap as directed  ____ Use inhalers on the day of surgery  ____ Stop metformin 2 days prior to surgery    ____ Take 1/2 of usual insulin dose the night before surgery and none on the morning of surgery.   __x__ Stop Coumadin/Plavix/aspirin on TODAY  ____ Stop Anti-inflammatories on TODAY, TYLENOL OK FOR  PAIN.   ____ Stop supplements until after surgery.    ____ Bring C-Pap to the hospital.

## 2015-04-15 ENCOUNTER — Encounter
Admission: RE | Admit: 2015-04-15 | Discharge: 2015-04-15 | Disposition: A | Discharge status: Home / Self Care | Payer: Medicare Other | Source: Ambulatory Visit | Attending: Urology | Admitting: Urology

## 2015-04-15 DIAGNOSIS — N329 Bladder disorder, unspecified: Secondary | ICD-10-CM | POA: Diagnosis not present

## 2015-04-16 NOTE — H&P (Signed)
Gabrielle Crosby 03/05/2015 8:20 AM Location: [facility] Patient #: [phone] DOB: Jan 03, 1933 Married / Language: Cleophus Molt / Race: White Female    History of Present Heriberto Antigua, MD; 03/05/2015 11:07 AM) The patient is a 81 year old female who presents for a recheck of Bladder cancer. Note for "Bladder cancer": 81 yo F with history of bladder cancer first dx on 12/2013. 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 She was counseled to undergo BCG, but however, due to national back order, she underwent mitomycin x 3.   She most recently completed BCG x 6 from 11/23-12/28/15.   She returns today for office cystoscopy.   Today, she denies any gross hematuria or voiding issues.      Problem List/Past Medical(Kelita Russell; 03/05/2015 10:28 AM) Bladder cancer (188.9  C67.9) Hematuria (599.70  R31.9) Bladder tumor (239.4  D49.4) Preop general physical exam (V72.83  Z01.818) Postop check (V67.00  Z09) Bladder mass (596.89  N32.89) Memory loss (780.93  R41.3)    Allergies(Kelita Russell; 03/05/2015 10:28 AM) No Known Drug Allergies. 01/02/2014    Family History(Kelita Russell; 03/05/2015 10:28 AM) Hypertension (401.9  I10). Mother.    Social History(Kelita Russell; 03/05/2015 10:28 AM) Tobacco use. Never smoker. Alcohol use. Non-drinker.    Travel History(Kelita Joneen Caraway; 03/05/2015 10:28 AM) Have you traveled internationally in the last 21 days?. No.    Medication History(Kelita Joneen Caraway; 03/05/2015 10:29 AM) Ciprofloxacin HCl (500MG  Tablet, 1 (one) Tablet Oral one time dose, Taken starting 03/05/2015) Active. (Given once in office prior to procedure.) Lovastatin (20MG  Tablet, Oral daily) Active. Medications Reconciled. ALPRAZolam (0.25MG  Tablet, Oral as needed)  Active.    Pregnancy / Birth History(Kelita Joneen Caraway; 03/05/2015 10:28 AM) Pregnancies (Gravida). 2 Living Children (Number Of). 2    Past Surgical History(Kelita Joneen Caraway; 03/05/2015 10:28 AM) Hysterectomy    Review of Systems(Kelita Joneen Caraway; 03/05/2015 10:28 AM) General:Not Present- Chills, Fatigue, Fever and Weight Gain > 10lbs.. Skin:Not Present- Hair Loss, Pruritus and Rash. HEENT:Not Present- Eye Pain, Decreased Hearing, Runny Nose, Snoring and Dry Mucous Membranes. Neck:Not Present- Neck Pain and Swollen Glands. Respiratory:Not Present- Chronic Cough, Difficulty Breathing on Exertion and Wakes up from Sleep Wheezing or Short of Breath. Cardiovascular:Not Present- Edema, Palpitations and Shortness of Breath. Gastrointestinal:Not Present- Constipation, Diarrhea, Nausea and Vomiting. Female Genitourinary:Note:See HPI Musculoskeletal:Not Present- Back Pain and Joint Pain. Neurological:Not Present- Decreased Memory, Headaches and Seizures. Psychiatric:Not Present- Anxiety and Depression. Endocrine:Not Present- Excessive Sweating, Heat Intolerance and Tired/Sluggish. Hematology:Not Present- Abnormal Bleeding, Anemia, Blood Transfusion and Enlarged Lymph Nodes.    Vitals(Kelita Joneen Caraway; 03/05/2015 10:30 AM) 03/05/2015 10:30 AM Weight: 127.38 lb Height: 61 in Body Surface Area: 1.58 m Body Mass Index: 24.07 kg/m Pulse: 87 (Regular) BP: 156/84 (Sitting, Left Arm, Standard)     Physical Guinevere Ferrari, MD; 03/05/2015 11:07 AM) The physical exam findings are as follows:   General Mental Status- Alert. Posture- Normal posture.   Chest and Lung Exam Inspection:Movements- Normal. Accessory muscles- No use of accessory muscles in breathing.   Abdomen Palpation/Percussion:Palpation and Percussion of the abdomen reveal - Soft and Non Tender.   Female Genitourinary Evaluation of genitourinary system reveals - no tenderness,  inflammation, rashes or lesions of external genitalia, urethral meatus normal, no discharge and urethra normal.   Neurologic Neurologic evaluation reveals - alert and oriented x 3 with no impairment of recent or remote memory.    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 She is s/p recent mitomycin x 3 and completed BCG x 6 in 10/2014 Cysto today reveals subtle mild erythema but no evidence of muscle invasion -urine cytology sent today, if positive, recommend repeat bladder biopsy to prove resoltion of CIS. If this is the case, will call patient/ husband to discuss. -cysto in 3 months if cytology negative Current Plans l Pt Education - How to access health information online: discussed with patient and provided information. l URINALYSIS, AUTOMATED W/ MICRO (- [facility] -) (81001) l Started Ciprofloxacin HCl 500MG , 1 (one) Tablet one time dose, #1, 1 day starting 03/05/2015, No Refill. Given once in office prior to procedure. l Cysto / Separate (52000) l Cystoscopy Procedure (Note)  Patient identification was confirmed, informed consent was obtained, and patient was prepped using Betadine solution. Lidocaine jelly was administered per urethral meatus.  Preoperative abx where received prior to procedure.  Procedure: - Flexible cystoscope introduced, without any difficulty. - Thorough search of the bladder revealed: normal urethral meatus normal urothelium with nonspecific erythema ? inflammation absent stones absent ulcers absent tumors absent urethral polyps absent trabeculation  - Ureteral orifices were normal in position and appearance.  Post-Procedure: - Patient tolerated the procedure well    Signed electronically by Hollice Espy, MD (03/11/2015 8:27 AM)  Addendum: Cytology atypical concerning for recurrence.  Recommend biopsy.

## 2015-04-17 ENCOUNTER — Ambulatory Visit: Payer: Medicare Other | Admitting: Anesthesiology

## 2015-04-17 ENCOUNTER — Encounter: Payer: Self-pay | Admitting: *Deleted

## 2015-04-17 ENCOUNTER — Encounter
Admission: RE | Disposition: A | Discharge status: Home / Self Care | Payer: Self-pay | Source: Ambulatory Visit | Attending: Urology

## 2015-04-17 ENCOUNTER — Ambulatory Visit
Admission: RE | Admit: 2015-04-17 | Discharge: 2015-04-17 | Disposition: A | Discharge status: Home / Self Care | Payer: Medicare Other | Source: Ambulatory Visit | Attending: Urology | Admitting: Urology

## 2015-04-17 DIAGNOSIS — Z90710 Acquired absence of both cervix and uterus: Secondary | ICD-10-CM | POA: Insufficient documentation

## 2015-04-17 DIAGNOSIS — Z8551 Personal history of malignant neoplasm of bladder: Secondary | ICD-10-CM

## 2015-04-17 DIAGNOSIS — N329 Bladder disorder, unspecified: Secondary | ICD-10-CM | POA: Insufficient documentation

## 2015-04-17 DIAGNOSIS — R413 Other amnesia: Secondary | ICD-10-CM | POA: Insufficient documentation

## 2015-04-17 DIAGNOSIS — Z79899 Other long term (current) drug therapy: Secondary | ICD-10-CM | POA: Insufficient documentation

## 2015-04-17 DIAGNOSIS — Z8249 Family history of ischemic heart disease and other diseases of the circulatory system: Secondary | ICD-10-CM | POA: Insufficient documentation

## 2015-04-17 DIAGNOSIS — I10 Essential (primary) hypertension: Secondary | ICD-10-CM | POA: Insufficient documentation

## 2015-04-17 HISTORY — DX: Malignant (primary) neoplasm, unspecified: C80.1

## 2015-04-17 HISTORY — DX: Unspecified dementia without behavioral disturbance: F03.90

## 2015-04-17 HISTORY — DX: Essential (primary) hypertension: I10

## 2015-04-17 HISTORY — PX: CYSTOSCOPY WITH BIOPSY: SHX5122

## 2015-04-17 LAB — URINE CULTURE: Culture: NO GROWTH

## 2015-04-17 SURGERY — CYSTOSCOPY, WITH BIOPSY
Anesthesia: General | Wound class: Clean Contaminated

## 2015-04-17 MED ORDER — MIDAZOLAM HCL 2 MG/2ML IJ SOLN
INTRAMUSCULAR | Status: DC | PRN
  Administered 2015-04-17: 1 mg via INTRAVENOUS

## 2015-04-17 MED ORDER — PROPOFOL 10 MG/ML IV BOLUS
INTRAVENOUS | Status: DC | PRN
  Administered 2015-04-17: 130 mg via INTRAVENOUS

## 2015-04-17 MED ORDER — LEVOFLOXACIN IN D5W 500 MG/100ML IV SOLN
500.0000 mg | Freq: Once | INTRAVENOUS | Status: AC
  Administered 2015-04-17: 500 mg via INTRAVENOUS

## 2015-04-17 MED ORDER — FAMOTIDINE 20 MG PO TABS
20.0000 mg | ORAL_TABLET | Freq: Once | ORAL | Status: AC
  Administered 2015-04-17: 20 mg via ORAL

## 2015-04-17 MED ORDER — FENTANYL CITRATE (PF) 100 MCG/2ML IJ SOLN
INTRAMUSCULAR | Status: DC | PRN
  Administered 2015-04-17: 50 ug via INTRAVENOUS

## 2015-04-17 MED ORDER — FAMOTIDINE 20 MG PO TABS
ORAL_TABLET | ORAL | Status: AC
  Administered 2015-04-17: 20 mg via ORAL
  Filled 2015-04-17: qty 1

## 2015-04-17 MED ORDER — ONDANSETRON HCL 4 MG/2ML IJ SOLN: 4.0000 mg | Freq: Once | INTRAMUSCULAR | Status: DC | PRN

## 2015-04-17 MED ORDER — PHENAZOPYRIDINE HCL 200 MG PO TABS: 200.0000 mg | ORAL_TABLET | Freq: Three times a day (TID) | ORAL | Status: DC | PRN

## 2015-04-17 MED ORDER — LEVOFLOXACIN IN D5W 500 MG/100ML IV SOLN
INTRAVENOUS | Status: AC
  Administered 2015-04-17: 500 mg via INTRAVENOUS
  Filled 2015-04-17: qty 100

## 2015-04-17 MED ORDER — HYDROCODONE-ACETAMINOPHEN 5-325 MG PO TABS: 1.0000 | ORAL_TABLET | Freq: Four times a day (QID) | ORAL | Status: DC | PRN

## 2015-04-17 MED ORDER — STERILE WATER FOR IRRIGATION IR SOLN
Status: DC | PRN
  Administered 2015-04-17: 600 mL

## 2015-04-17 MED ORDER — LACTATED RINGERS IV SOLN
INTRAVENOUS | Status: DC
  Administered 2015-04-17: 11:00:00 via INTRAVENOUS

## 2015-04-17 MED ORDER — FENTANYL CITRATE (PF) 100 MCG/2ML IJ SOLN: 25.0000 ug | INTRAMUSCULAR | Status: DC | PRN

## 2015-04-17 MED ORDER — ONDANSETRON HCL 4 MG/2ML IJ SOLN
INTRAMUSCULAR | Status: DC | PRN
  Administered 2015-04-17: 4 mg via INTRAVENOUS

## 2015-04-17 SURGICAL SUPPLY — 25 items
BAG DRAIN CYSTO-URO LG1000N (MISCELLANEOUS) ×3 IMPLANT
BAG URO DRAIN 2000ML W/SPOUT (MISCELLANEOUS) ×3 IMPLANT
CATH FOLEY 2WAY  5CC 16FR (CATHETERS) ×2
CATH URTH 16FR FL 2W BLN LF (CATHETERS) ×1 IMPLANT
CORD URO TURP 10FT (MISCELLANEOUS) ×3 IMPLANT
ELECT LOOP MED HF 24F 12D (CUTTING LOOP) ×3 IMPLANT
ELECT URO BUGBEE 5F (MISCELLANEOUS) ×3
ELECTRODE URO BUGBEE 5F (MISCELLANEOUS) ×1 IMPLANT
GLOVE BIO SURGEON STRL SZ 6.5 (GLOVE) ×2 IMPLANT
GLOVE BIO SURGEON STRL SZ7 (GLOVE) ×6 IMPLANT
GLOVE BIO SURGEONS STRL SZ 6.5 (GLOVE) ×1
GOWN STRL REUS W/ TWL LRG LVL3 (GOWN DISPOSABLE) ×2 IMPLANT
GOWN STRL REUS W/TWL LRG LVL3 (GOWN DISPOSABLE) ×4
JELLY LUB 2OZ STRL (MISCELLANEOUS) ×2
JELLY LUBE 2OZ STRL (MISCELLANEOUS) ×1 IMPLANT
KIT RM TURNOVER CYSTO AR (KITS) ×3 IMPLANT
LOOP CUT RT ANGL 28F (MISCELLANEOUS) ×3 IMPLANT
PACK CYSTO AR (MISCELLANEOUS) ×3 IMPLANT
PAD GROUND ADULT SPLIT (MISCELLANEOUS) ×3 IMPLANT
PREP PVP WINGED SPONGE (MISCELLANEOUS) ×3 IMPLANT
ROLLER BALL 3MM 24FR (ELECTROSURGICAL) ×3 IMPLANT
SET IRRIG Y TYPE TUR BLADDER L (SET/KITS/TRAYS/PACK) ×3 IMPLANT
SYRINGE IRR TOOMEY STRL 70CC (SYRINGE) ×3 IMPLANT
WATER STERILE IRR 1000ML POUR (IV SOLUTION) ×3 IMPLANT
WATER STERILE IRR 3000ML UROMA (IV SOLUTION) ×3 IMPLANT

## 2015-04-17 NOTE — Brief Op Note (Signed)
04/17/2015  2:51 PM  PATIENT:  Gabrielle Crosby  81 y.o. female  PRE-OPERATIVE DIAGNOSIS:  BLADDER CANCER  POST-OPERATIVE DIAGNOSIS:  BLADDER CANCER  PROCEDURE:  Procedure(s): CYSTOSCOPY WITH BIOPSY (N/A)  SURGEON:  Surgeon(s) and Role:    * Hollice Espy, MD - Primary  ASSISTANTS: none   ANESTHESIA:   general  EBL:  Total I/O In: 800 [I.V.:800] Out: -   Drains: none  Specimen: bladder biopsy (random)  COUNTS CORRECT: YES  PLAN OF CARE: Discharge to home after PACU  PATIENT DISPOSITION:  PACU - hemodynamically stable.

## 2015-04-17 NOTE — Transfer of Care (Signed)
Immediate Anesthesia Transfer of Care Note  Patient: Gabrielle Crosby  Procedure(s) Performed: Procedure(s): CYSTOSCOPY WITH BIOPSY (N/A)  Patient Location: PACU  Anesthesia Type:General  Level of Consciousness: sedated and patient cooperative  Airway & Oxygen Therapy: Patient Spontanous Breathing and Patient connected to face mask oxygen  Post-op Assessment: Report given to RN and Post -op Vital signs reviewed and stable  Post vital signs: Reviewed and stable  Last Vitals:  Filed Vitals:   04/17/15 1450  BP: 124/59  Pulse: 80  Temp: 36.9  Resp: 12    Complications: No apparent anesthesia complications

## 2015-04-17 NOTE — Anesthesia Procedure Notes (Signed)
Procedure Name: LMA Insertion Date/Time: 04/17/2015 2:21 PM Performed by: Dionne Bucy Patient Re-evaluated:Patient Re-evaluated prior to inductionOxygen Delivery Method: Circle system utilized Preoxygenation: Pre-oxygenation with 100% oxygen Intubation Type: IV induction Ventilation: Mask ventilation without difficulty LMA: LMA inserted LMA Size: 4.0 Number of attempts: 1 Placement Confirmation: positive ETCO2 Tube secured with: Tape Dental Injury: Teeth and Oropharynx as per pre-operative assessment

## 2015-04-17 NOTE — Discharge Instructions (Addendum)
Transurethral Resection of Bladder Tumor (TURBT) or Bladder Biopsy   Definition:  Transurethral Resection of the Bladder Tumor is a surgical procedure used to diagnose and remove tumors within the bladder. TURBT is the most common treatment for early stage bladder cancer.  General instructions:     Your recent bladder surgery requires very little post hospital care but some definite precautions.  Despite the fact that no skin incisions were used, the area around the bladder incisions are raw and covered with scabs to promote healing and prevent bleeding. Certain precautions are needed to insure that the scabs are not disturbed over the next 2-4 weeks while the healing proceeds.  Because the raw surface inside your bladder and the irritating effects of urine you may expect frequency of urination and/or urgency (a stronger desire to urinate) and perhaps even getting up at night more often. This will usually resolve or improve slowly over the healing period. You may see some blood in your urine over the first 6 weeks. Do not be alarmed, even if the urine was clear for a while. Get off your feet and drink lots of fluids until clearing occurs. If you start to pass clots or don't improve call us.  Diet:  You may return to your normal diet immediately. Because of the raw surface of your bladder, alcohol, spicy foods, foods high in acid and drinks with caffeine may cause irritation or frequency and should be used in moderation. To keep your urine flowing freely and avoid constipation, drink plenty of fluids during the day (8-10 glasses). Tip: Avoid cranberry juice because it is very acidic.  Activity:  Your physical activity doesn't need to be restricted. However, if you are very active, you may see some blood in the urine. We suggest that you reduce your activity under the circumstances until the bleeding has stopped.  Bowels:  It is important to keep your bowels regular during the postoperative  period. Straining with bowel movements can cause bleeding. A bowel movement every other day is reasonable. Use a mild laxative if needed, such as milk of magnesia 2-3 tablespoons, or 2 Dulcolax tablets. Call if you continue to have problems. If you had been taking narcotics for pain, before, during or after your surgery, you may be constipated. Take a laxative if necessary.    Medication:  You should resume your pre-surgery medications unless told not to. In addition you may be given an antibiotic to prevent or treat infection. Antibiotics are not always necessary. All medication should be taken as prescribed until the bottles are finished unless you are having an unusual reaction to one of the drugs.   [facility] [address], [place], [place] [address] [phone]    AMBULATORY SURGERY  DISCHARGE INSTRUCTIONS   1) The drugs that you were given will stay in your system until tomorrow so for the next 24 hours you should not:  A) Drive an automobile B) Make any legal decisions C) Drink any alcoholic beverage   2) You may resume regular meals tomorrow.  Today it is better to start with liquids and gradually work up to solid foods.  You may eat anything you prefer, but it is better to start with liquids, then soup and crackers, and gradually work up to solid foods.   3) Please notify your doctor immediately if you have any unusual bleeding, trouble breathing, redness and pain at the surgery site, drainage, fever, or pain not relieved by medication. 4)  5) Your post-operative visit with Dr.    George Ina                                 is: Date:                        Time:    Please call to schedule your post-operative visit.  6) Additional Instructions: 7)

## 2015-04-17 NOTE — Interval H&P Note (Signed)
History and Physical Interval Note:  04/17/2015 1:44 PM  Gabrielle Crosby  has presented today for surgery, with the diagnosis of BLADDER CANCER  The various methods of treatment have been discussed with the patient and family. After consideration of risks, benefits and other options for treatment, the patient has consented to  Procedure(s): CYSTOSCOPY WITH BIOPSY (N/A) as a surgical intervention .  The patient's history has been reviewed, patient examined, no change in status, stable for surgery.  I have reviewed the patient's chart and labs.  Questions were answered to the patient's satisfaction.    RRR CTAB   [name]

## 2015-04-17 NOTE — Op Note (Signed)
Date of procedure: 04/17/2015  Preoperative diagnosis:  1. History of cancer 2. Atypical urine cytology   Postoperative diagnosis:  1. Same   Procedure: 1. Cystoscopy, random bladder biopsy  Surgeon: Hollice Espy, MD  Anesthesia: General  Complications: None  Intraoperative findings: Relatively normal appearing bladder  EBL: minimal  Specimens: random bladder biopsy  Drains: none  Indication: Gabrielle Crosby is a 81 y.o. 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  Most recently, she completed a course of BCG followed by office cystoscopy 3 months later which revealed nonspecific erythema. Additionally, her urine cytology was atypical therefore she was counseled to return to the operating room for bladder biopsies. After reviewing the management options for treatment, she elected to proceed with the above surgical procedure(s). We have discussed the potential benefits and risks of the procedure, side effects of the proposed treatment, the likelihood of the patient achieving the goals of the procedure, and any potential problems that might occur during the procedure or recuperation. Informed consent has been obtained.  Description of procedure:  The patient was taken to the operating room and general anesthesia was induced.  The patient was placed in the dorsal lithotomy position, prepped and draped in the usual sterile fashion, and preoperative antibiotics were administered. A preoperative time-out was performed.   A rigid 75 French cystoscope was advanced per urethra into the bladder and the bladder was carefully inspected. This revealed a relatively normal appearing bladder without any obvious bladder erythema or lesions. There is no masses, tumors, lesions, stones, or erosions. The trigone was also normal-appearing with reflux of clear urine from both UOs.  The bladder neck was also carefully inspected which appeared normal. Based on her  history, I did go ahead and proceed with random bladder biopsies today. Using the cold cup biopsy forceps, a total of 5 areas of the bladder were sampled including the posterior wall, dome, left and right lateral walls, and trigone.  Bugbee electrocautery was then used to fulgurate the bases of these biopsy sites. Specimen was passed off as random bladder biopsies. The bladder was drained. There is no evidence of active bleeding.  The scope was removed and the patient was repositioned in the supine position. She was reversed from anesthesia and taken to the PACU in stable condition. There are no complications in this case.  Hollice Espy, M.D.

## 2015-04-17 NOTE — Anesthesia Postprocedure Evaluation (Signed)
  Anesthesia Post-op Note  Patient: Gabrielle Crosby  Procedure(s) Performed: Procedure(s): CYSTOSCOPY WITH BIOPSY (N/A)  Anesthesia type:General  Patient location: PACU  Post pain: Pain level controlled  Post assessment: Post-op Vital signs reviewed, Patient's Cardiovascular Status Stable, Respiratory Function Stable, Patent Airway and No signs of Nausea or vomiting  Post vital signs: Reviewed and stable  Last Vitals:  Filed Vitals:   04/17/15 1505  BP: 124/55  Pulse: 84  Temp:   Resp: 17    Level of consciousness: awake, alert  and patient cooperative  Complications: No apparent anesthesia complications

## 2015-04-17 NOTE — Anesthesia Preprocedure Evaluation (Signed)
Anesthesia Evaluation  Patient identified by MRN, date of birth, ID band Patient awake    Reviewed: Allergy & Precautions, NPO status , Patient's Chart, lab work & pertinent test results  Airway Mallampati: I  TM Distance: >3 FB Neck ROM: Full    Dental  (+) Partial Lower, Partial Upper   Pulmonary          Cardiovascular hypertension (Off meds for a long time),     Neuro/Psych    GI/Hepatic   Endo/Other    Renal/GU      Musculoskeletal   Abdominal   Peds  Hematology   Anesthesia Other Findings   Reproductive/Obstetrics                             Anesthesia Physical Anesthesia Plan  ASA: II  Anesthesia Plan: General   Post-op Pain Management:    Induction:   Airway Management Planned: LMA  Additional Equipment:   Intra-op Plan:   Post-operative Plan:   Informed Consent: I have reviewed the patients History and Physical, chart, labs and discussed the procedure including the risks, benefits and alternatives for the proposed anesthesia with the patient or authorized representative who has indicated his/her understanding and acceptance.     Plan Discussed with:   Anesthesia Plan Comments:         Anesthesia Quick Evaluation

## 2015-04-18 ENCOUNTER — Encounter: Payer: Self-pay | Admitting: Urology

## 2015-04-23 LAB — SURGICAL PATHOLOGY

## 2015-06-07 ENCOUNTER — Ambulatory Visit (INDEPENDENT_AMBULATORY_CARE_PROVIDER_SITE_OTHER): Payer: Self-pay | Admitting: Urology

## 2015-06-07 ENCOUNTER — Encounter: Payer: Self-pay | Admitting: Urology

## 2015-06-07 VITALS — BP 152/72 | HR 72 | Ht 62.0 in | Wt 122.5 lb

## 2015-06-07 DIAGNOSIS — C679 Malignant neoplasm of bladder, unspecified: Secondary | ICD-10-CM

## 2015-06-07 LAB — URINALYSIS, COMPLETE
BILIRUBIN UA: NEGATIVE
Glucose, UA: NEGATIVE
Ketones, UA: NEGATIVE
LEUKOCYTES UA: NEGATIVE
NITRITE UA: NEGATIVE
PH UA: 7 (ref 5.0–7.5)
PROTEIN UA: NEGATIVE
Specific Gravity, UA: 1.02 (ref 1.005–1.030)
Urobilinogen, Ur: 0.2 mg/dL (ref 0.2–1.0)

## 2015-06-07 LAB — MICROSCOPIC EXAMINATION: Bacteria, UA: NONE SEEN

## 2015-06-07 MED ORDER — CIPROFLOXACIN HCL 500 MG PO TABS
500.0000 mg | ORAL_TABLET | Freq: Once | ORAL | Status: AC
  Administered 2015-06-07: 500 mg via ORAL

## 2015-06-07 MED ORDER — LIDOCAINE HCL 2 % EX GEL
1.0000 "application " | Freq: Once | CUTANEOUS | Status: AC
  Administered 2015-06-07: 1 via URETHRAL

## 2015-06-07 NOTE — Patient Instructions (Signed)
Cystoscopy       Cystoscopy is a procedure that is used to help your caregiver diagnose and sometimes treat conditions that affect your lower urinary tract. Your lower urinary tract includes your bladder and the tube through which urine passes from your bladder out of your body (urethra). Cystoscopy is performed with a thin, tube-shaped instrument (cystoscope). The cystoscope has lenses and a light at the end so that your caregiver can see inside your bladder. The cystoscope is inserted at the entrance of your urethra. Your caregiver guides it through your urethra and into your bladder. There are two main types of cystoscopy:   Flexible cystoscopy (with a flexible cystoscope).   Rigid cystoscopy (with a rigid cystoscope).  Cystoscopy may be recommended for many conditions, including:   Urinary tract infections.   Blood in your urine (hematuria).   Loss of bladder control (urinary incontinence) or overactive bladder.   Unusual cells found in a urine sample.   Urinary blockage.   Painful urination.  Cystoscopy may also be done to remove a sample of your tissue to be checked under a microscope (biopsy). It may also be done to remove or destroy bladder stones.   LET YOUR CAREGIVER KNOW ABOUT:   Allergies to food or medicine.   Medicines taken, including vitamins, herbs, eyedrops, over-the-counter medicines, and creams.   Use of steroids (by mouth or creams).   Previous problems with anesthetics or numbing medicines.   History of bleeding problems or blood clots.   Previous surgery.   Other health problems, including diabetes and kidney problems.   Possibility of pregnancy, if this applies.  PROCEDURE   The area around the opening to your urethra will be cleaned. A medicine to numb your urethra (local anesthetic) is used. If a tissue sample or stone is removed during the procedure, you may be given a medicine to make you sleep (general anesthetic).   Your caregiver will gently insert the tip of the cystoscope into your  urethra. The cystoscope will be slowly glided through your urethra and into your bladder. Sterile fluid will flow through the cystoscope and into your bladder. The fluid will expand and stretch your bladder. This gives your caregiver a better view of your bladder walls. The procedure lasts about 15-20 minutes.   AFTER THE PROCEDURE   If a local anesthetic is used, you will be allowed to go home as soon as you are ready. If a general anesthetic is used, you will be taken to a recovery area until you are stable. You may have temporary bleeding and burning on urination.   Document Released: 11/13/2000 Document Revised: 08/10/2012 Document Reviewed: 05/09/2012   Exit[employer]® Patient Information ©2015 [employer]. This information is not intended to replace advice given to you by your health care provider. Make sure you discuss any questions you have with your health care provider.

## 2015-06-07 NOTE — Progress Notes (Signed)
06/07/2015 11:39 AM   Gabrielle Crosby 05-Jan-1933 [id number]  Referring provider: Sherrin Daisy, MD [place], [place] [address]  Chief Complaint  Patient presents with  . Follow-up    3 mth f/u cystoscopy. history of bladder cancer first dx on 12/2013. 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     HPI: 81 yo F with history of bladder cancer first dx on 12/2013. 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 She was counseled to undergo BCG, but however, due to national back order, she underwent mitomycin x 3.  Once available, she completed BCG x 6 from 11/23-12/28/15.   Most recently, she returned to the OR on 04/23/2015 after some nonspecific patchy erythema was identified on surveillance cystoscopy in the setting of atypical urine cytology. Biopsy results were negative for any malignancy (BCG granuloma).  She returns today for office cystoscopy.   Today, she denies any gross hematuria or voiding issues.     PMH: Past Medical History  Diagnosis Date  . Hypertension   . Cancer     BLADDER X 2  . Dementia   . Hyperlipidemia   . Bladder cancer     Surgical History: Past Surgical History  Procedure Laterality Date  . Abdominal hysterectomy Bilateral     BILATERAL s&o  . Bladder surgery N/A     REMOVAL OF BLADDER CANCER X 2  . Bowel resection N/A     SMALL BOWEL OBSTRUCTION  . Cystoscopy with biopsy N/A 04/17/2015    Procedure: CYSTOSCOPY WITH BIOPSY;  Surgeon: Hollice Espy, MD;  Location: [facility] ORS;  Service: Urology;  Laterality: N/A;    Home Medications:    Medication List       This list is accurate as of: 06/07/15 11:39 AM.  Always use your most recent med list.                ALPRAZolam 0.25 MG tablet  Commonly known as:  XANAX  TAKE 1 TABLET EVERY DAY AS NEEDED FOR ANXIETY     aspirin EC 81 MG tablet  Take by mouth.     fluticasone 50 MCG/ACT nasal spray  Commonly known as:  FLONASE  Place into the nose.     HYDROcodone-acetaminophen 5-325 MG per tablet  Commonly known as:  NORCO/VICODIN  Take 1-2 tablets by mouth every 6 (six) hours as needed.     lovastatin 20 MG tablet  Commonly known as:  MEVACOR  TAKE 1 TABLET (20 MG TOTAL) BY MOUTH DAILY WITH DINNER.     phenazopyridine 200 MG tablet  Commonly known as:  PYRIDIUM  Take 1 tablet (200 mg total) by mouth 3 (three) times daily as needed for pain.        Allergies:  Allergies  Allergen Reactions  . Atenolol Other (See Comments)    Bradycardia   . Donepezil Hcl Other (See Comments)    Mental changes, "made me crazy"    Family History: Family History  Problem Relation Age of Onset  . Family history unknown: Yes    Social History:  reports that she has never smoked. She does not have any smokeless tobacco history on file. She reports that she does not drink alcohol or use illicit drugs.  Physical Exam: BP 152/72 mmHg  Pulse 72  Ht 5\' 2"  (1.575 m)  Wt 122 lb 8 oz (55.566 kg)  BMI 22.40 kg/m2  Constitutional:  Alert and oriented, No acute distress. HEENT: [place] AT, moist mucus membranes.  Trachea midline, no masses. Cardiovascular: No clubbing, cyanosis, or edema. Respiratory: Normal respiratory effort, no increased work of breathing. GI: Abdomen is soft, nontender, nondistended, no abdominal masses GU: No CVA tenderness. Normal external female genitalia, normal urethral meatus. Skin: No rashes, bruises or suspicious lesions. Neurologic: Grossly intact, no focal deficits, moving all 4 extremities. Psychiatric: Normal mood and affect.  Laboratory Data: Lab Results  Component Value Date   WBC 5.9 12/14/2014   HGB 13.0 12/14/2014   HCT 39.6 12/14/2014   MCV 97  12/14/2014   PLT 208 12/14/2014    Lab Results  Component Value Date   CREATININE 0.71 12/14/2014    Urinalysis Urine dipstick shows positive for RBC's.  Micro exam: 3-10 RBC's per HPF, otherwise negative.     Cystoscopy Procedure Note  Patient identification was confirmed, informed consent was obtained, and patient was prepped using Betadine solution.  Lidocaine jelly was administered per urethral meatus.    Preoperative abx where received prior to procedure.    Procedure: - Flexible cystoscope introduced, without any difficulty.   - Thorough search of the bladder revealed:    normal urethral meatus    normal urothelium with 1 small area of erythema on the posterior bladder wall with surrounding scar consistent with previous biopsy site    no stones    no ulcers     no tumors    no urethral polyps    no trabeculation  - Ureteral orifices were normal in position and appearance.  Post-Procedure: - Patient tolerated the procedure well   Assessment & Plan:    1. 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  Most recent bx 03/2015 c/w BCG granuloma, no recurrence.  Cysto today fairly unremarkable. -RTC for cysto in 3 months -repeat cytology next visit - Urinalysis, Complete - lidocaine (XYLOCAINE) 2 % jelly 1 application; Place 1 application into the urethra once. - ciprofloxacin (CIPRO) tablet 500 mg; Take 1 tablet (500 mg total) by mouth once.   Return in about 3 months (around 09/07/2015) for cystoscopy.  Hollice Espy, MD  [facility] [address] [place], [place] [address] [phone]

## 2015-09-23 ENCOUNTER — Ambulatory Visit (INDEPENDENT_AMBULATORY_CARE_PROVIDER_SITE_OTHER): Payer: Medicare Other | Admitting: Obstetrics and Gynecology

## 2015-09-23 ENCOUNTER — Encounter: Payer: Self-pay | Admitting: Obstetrics and Gynecology

## 2015-09-23 VITALS — BP 168/80 | HR 78 | Resp 16 | Ht 61.0 in | Wt 121.5 lb

## 2015-09-23 DIAGNOSIS — R350 Frequency of micturition: Secondary | ICD-10-CM | POA: Diagnosis not present

## 2015-09-23 LAB — URINALYSIS, COMPLETE
Bilirubin, UA: NEGATIVE
GLUCOSE, UA: NEGATIVE
Ketones, UA: NEGATIVE
Nitrite, UA: NEGATIVE
Protein, UA: NEGATIVE
Specific Gravity, UA: 1.02 (ref 1.005–1.030)
UUROB: 1 mg/dL (ref 0.2–1.0)
pH, UA: 7 (ref 5.0–7.5)

## 2015-09-23 LAB — MICROSCOPIC EXAMINATION
Epithelial Cells (non renal): NONE SEEN /hpf (ref 0–10)
RENAL EPITHEL UA: NONE SEEN /HPF
WBC, UA: >30 /hpf — AB (ref 0–?)

## 2015-09-23 LAB — BLADDER SCAN AMB NON-IMAGING

## 2015-09-23 NOTE — Progress Notes (Signed)
09/23/2015 1:56 PM   Gabrielle Crosby May 13, 1933 [id number]  Referring provider: Sherrin Daisy, MD Gabrielle Crosby [place], [place] [address]  Chief Complaint  Patient presents with  . Urinary Frequency    HPI: Patient is a 81yo female with a history of bladder cancer presenting today with c/o of intermmittent frequency and dysuria for 1-2 weeks.  No fevers, flank pain or gross hematuria.  Previous History: 81 yo F with history of bladder cancer first dx on 12/2013. 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 She was counseled to undergo BCG, but however, due to national back order, she underwent mitomycin x 3. Once available, she completed BCG x 6 from 11/23-12/28/15.   Most recently, she returned to the OR on 04/23/2015 after some nonspecific patchy erythema was identified on surveillance cystoscopy in the setting of atypical urine cytology. Biopsy results were negative for any malignancy (BCG granuloma).   PMH: Past Medical History  Diagnosis Date  . Hypertension   . Cancer ([place])     BLADDER X 2  . Dementia   . Hyperlipidemia   . Bladder cancer [facility])     Surgical History: Past Surgical History  Procedure Laterality Date  . Abdominal hysterectomy Bilateral     BILATERAL s&o  . Bladder surgery N/A     REMOVAL OF BLADDER CANCER X 2  . Bowel resection N/A     SMALL BOWEL OBSTRUCTION  . Cystoscopy with biopsy N/A 04/17/2015    Procedure: CYSTOSCOPY WITH BIOPSY;  Surgeon: Gabrielle Espy, MD;  Location: [facility] ORS;  Service: Urology;  Laterality: N/A;    Home Medications:    Medication List       This list is accurate as of: 09/23/15  1:56 PM.  Always use your most recent med list.               ALPRAZolam 0.25 MG tablet  Commonly known as:  XANAX  TAKE 1 TABLET EVERY DAY AS NEEDED FOR ANXIETY     aspirin EC 81 MG tablet   Take by mouth.     fluticasone 50 MCG/ACT nasal spray  Commonly known as:  FLONASE  Place into the nose.     HYDROcodone-acetaminophen 5-325 MG tablet  Commonly known as:  NORCO/VICODIN  Take 1-2 tablets by mouth every 6 (six) hours as needed.     lovastatin 20 MG tablet  Commonly known as:  MEVACOR  TAKE 1 TABLET (20 MG TOTAL) BY MOUTH DAILY WITH DINNER.        Allergies:  Allergies  Allergen Reactions  . Atenolol Other (See Comments)    Bradycardia   . Donepezil Hcl Other (See Comments)    Mental changes, "made me crazy"    Family History: Family History  Problem Relation Age of Onset  . Family history unknown: Yes    Social History:  reports that she has never smoked. She does not have any smokeless tobacco history on file. She reports that she does not drink alcohol or use illicit drugs.  ROS: UROLOGY Frequent Urination?: No Hard to postpone urination?: No Burning/pain with urination?: Yes Get up at night to urinate?: No Leakage of urine?: No Urine stream starts and stops?: No Trouble starting stream?: No Do you have to strain to urinate?: No Blood in urine?: No Urinary tract infection?: No Sexually transmitted disease?: No Injury to kidneys or bladder?:  No Painful intercourse?: No Weak stream?: No Currently pregnant?: No Vaginal bleeding?: No Last menstrual period?: n  Gastrointestinal Nausea?: No Vomiting?: No Indigestion/heartburn?: No Diarrhea?: No Constipation?: No  Constitutional Fever: No Night sweats?: No Weight loss?: No Fatigue?: No  Skin Skin rash/lesions?: No Itching?: No  Eyes Blurred vision?: No Double vision?: No  Ears/Nose/Throat Sore throat?: No Sinus problems?: No  Hematologic/Lymphatic Swollen glands?: No Easy bruising?: No  Cardiovascular Leg swelling?: No Chest pain?: No  Respiratory Cough?: No Shortness of breath?: No  Endocrine Excessive thirst?: No  Musculoskeletal Back pain?: No Joint pain?:  No  Neurological Headaches?: No Dizziness?: No  Psychologic Depression?: No Anxiety?: No  Physical Exam: BP 168/80 mmHg  Pulse 78  Resp 16  Ht 5\' 1"  (1.549 m)  Wt 121 lb 8 oz (55.112 kg)  BMI 22.97 kg/m2  Constitutional:  Alert and oriented, No acute distress. HEENT: [place] AT, moist mucus membranes.  Trachea midline, no masses. Cardiovascular: No clubbing, cyanosis, or edema. Respiratory: Normal respiratory effort, no increased work of breathing. GI: Abdomen is soft, nontender, nondistended, no abdominal masses GU: No CVA tenderness.  Skin: No rashes, bruises or suspicious lesions. Lymph: No cervical or inguinal adenopathy. Neurologic: Grossly intact, no focal deficits, moving all 4 extremities. Psychiatric: Normal mood and affect.  Laboratory Data:   Urinalysis    Component Value Date/Time   COLORURINE Yellow 12/13/2014 1745   APPEARANCEUR Clear 12/13/2014 1745   LABSPEC 1.012 12/13/2014 1745   PHURINE 6.0 12/13/2014 1745   GLUCOSEU Negative 09/23/2015 1035   GLUCOSEU Negative 12/13/2014 1745   HGBUR 1+ 12/13/2014 1745   BILIRUBINUR Negative 09/23/2015 1035   BILIRUBINUR Negative 12/13/2014 1745   KETONESUR Negative 12/13/2014 1745   PROTEINUR Negative 12/13/2014 1745   NITRITE Negative 09/23/2015 1035   NITRITE Negative 12/13/2014 1745   LEUKOCYTESUR 3+* 09/23/2015 1035   LEUKOCYTESUR Negative 12/13/2014 1745    Pertinent Imaging:  Assessment & Plan:    1. Urinary frequency- >30 WBCs, 11-3- RBCs, many bacteria, nitrite negative. Urine sent for culture, will treat pending results.  - Urinalysis, Complete - Bladder Scan (Post Void Residual) in office  2. Malignant neoplasm of urinary bladder, unspecified site Dx 12/2013 with Gabrielle Crosby s/p TURBT for right lateral wall tumor followed by Dx CIS 04/2014 s/p BCG x 6. Most recent bx 03/2015 c/w BCG granuloma, no recurrence.Last cysto 06/07/15 fairly unremarkable. -Keep scheduled appt in 1 month for cystoscopy with Dr.  Erlene Crosby.   Return in about 1 month (around 10/24/2015).  These notes generated with voice recognition software. I apologize for typographical errors.  Gabrielle Crosby, [facility] [address] [place], [place] [address] [phone]

## 2015-09-25 ENCOUNTER — Telehealth: Payer: Self-pay

## 2015-09-25 DIAGNOSIS — N390 Urinary tract infection, site not specified: Secondary | ICD-10-CM

## 2015-09-25 LAB — CULTURE, URINE COMPREHENSIVE

## 2015-09-25 MED ORDER — NITROFURANTOIN MONOHYD MACRO 100 MG PO CAPS: 100.0000 mg | ORAL_CAPSULE | Freq: Two times a day (BID) | ORAL | Status: AC

## 2015-09-25 NOTE — Telephone Encounter (Signed)
-----   Message from Roda Shutters, [place] sent at 09/25/2015  2:00 PM EDT ----- Please notify patient that her urine culture was positive for infection. Please send a prescription for Macrobid twice a day 100 mg 7 days. Thanks

## 2015-09-25 NOTE — Telephone Encounter (Signed)
Spoke with pt in reference to +ucx. Pt voiced understanding. Medication called into pharmacy.

## 2015-10-01 ENCOUNTER — Telehealth: Payer: Self-pay | Admitting: Urology

## 2015-10-01 NOTE — Telephone Encounter (Signed)
I called to move her cysto appt from the 25th due the the Thanksgiving holiday and she informed me that she is battling cancer and memory loss. She is not able to drive and has a lot of health problems right now. She wanted to cancel the cysto for now and would call back later if she wanted to reschd this.  Thanks, Sharyn Lull

## 2015-10-25 ENCOUNTER — Other Ambulatory Visit: Payer: Self-pay | Admitting: Urology

## 2015-10-28 ENCOUNTER — Ambulatory Visit (INDEPENDENT_AMBULATORY_CARE_PROVIDER_SITE_OTHER): Payer: Medicare Other | Admitting: Urology

## 2015-10-28 VITALS — BP 167/73 | HR 78 | Ht 61.0 in | Wt 123.9 lb

## 2015-10-28 DIAGNOSIS — C679 Malignant neoplasm of bladder, unspecified: Secondary | ICD-10-CM

## 2015-10-28 LAB — MICROSCOPIC EXAMINATION

## 2015-10-28 LAB — URINALYSIS, COMPLETE
BILIRUBIN UA: NEGATIVE
Glucose, UA: NEGATIVE
Ketones, UA: NEGATIVE
Leukocytes, UA: NEGATIVE
Nitrite, UA: NEGATIVE
PH UA: 7 (ref 5.0–7.5)
PROTEIN UA: NEGATIVE
Specific Gravity, UA: 1.02 (ref 1.005–1.030)
Urobilinogen, Ur: 0.2 mg/dL (ref 0.2–1.0)

## 2015-10-28 MED ORDER — LIDOCAINE HCL 2 % EX GEL
1.0000 "application " | Freq: Once | CUTANEOUS | Status: AC
  Administered 2015-10-28: 1 via URETHRAL

## 2015-10-28 MED ORDER — CIPROFLOXACIN HCL 500 MG PO TABS
500.0000 mg | ORAL_TABLET | Freq: Once | ORAL | Status: AC
  Administered 2015-10-28: 500 mg via ORAL

## 2015-10-28 NOTE — Progress Notes (Addendum)
5:45 PM   Gabrielle Crosby [facility] Dec 30, 1932 [id number]  Referring provider: Sherrin Daisy, MD [place], [place] [id number]  Chief Complaint  Patient presents with  . Cysto    HPI: 81 yo F with history of bladder cancer first dx on 12/2013. 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 She was counseled to undergo BCG, but however, due to national back order, she underwent mitomycin x 3.  Once available, she completed BCG x 6 from 11/23-12/28/15.   Most recently, she returned to the OR on 04/23/2015 after some nonspecific patchy erythema was identified on surveillance cystoscopy in the setting of atypical urine cytology. Biopsy results were negative for any malignancy (BCG granuloma).  She returns today for office cystoscopy.   Today, she denies any gross hematuria or voiding issues.     PMH: Past Medical History  Diagnosis Date  . Hypertension   . Cancer ([place])     BLADDER X 2  . Dementia   . Hyperlipidemia   . Bladder cancer [facility])     Surgical History: Past Surgical History  Procedure Laterality Date  . Abdominal hysterectomy Bilateral     BILATERAL s&o  . Bladder surgery N/A     REMOVAL OF BLADDER CANCER X 2  . Bowel resection N/A     SMALL BOWEL OBSTRUCTION  . Cystoscopy with biopsy N/A 04/17/2015    Procedure: CYSTOSCOPY WITH BIOPSY;  Surgeon: Hollice Espy, MD;  Location: [facility] ORS;  Service: Urology;  Laterality: N/A;    Home Medications:    Medication List       This list is accurate as of: 10/28/15  5:45 PM.  Always use your most recent med list.               ALPRAZolam 0.25 MG tablet  Commonly known as:  XANAX  TAKE 1 TABLET EVERY DAY AS NEEDED FOR ANXIETY     aspirin EC 81 MG tablet  Take by mouth.     fluticasone 50 MCG/ACT nasal spray  Commonly known as:  FLONASE  Place into the  nose.     lovastatin 20 MG tablet  Commonly known as:  MEVACOR  TAKE 1 TABLET (20 MG TOTAL) BY MOUTH DAILY WITH DINNER.        Allergies:  Allergies  Allergen Reactions  . Atenolol Other (See Comments)    Bradycardia   . Donepezil Hcl Other (See Comments)    Mental changes, "made me crazy"    Family History: Family History  Problem Relation Age of Onset  . Family history unknown: Yes    Social History:  reports that she has never smoked. She does not have any smokeless tobacco history on file. She reports that she does not drink alcohol or use illicit drugs.   Physical Exam: BP 167/73 mmHg  Pulse 78  Ht 5\' 1"  (1.549 m)  Wt 123 lb 14.4 oz (56.201 kg)  BMI 23.42 kg/m2  Constitutional:  Alert and oriented, No acute distress. HEENT: [place] AT, moist mucus membranes.  Trachea midline, no masses. Cardiovascular: No clubbing, cyanosis, or edema. Respiratory: Normal respiratory effort, no increased work of breathing. GI: Abdomen is soft, nontender, nondistended, no abdominal masses GU: No CVA tenderness. Normal external female genitalia, normal urethral meatus. Skin: No rashes, bruises or suspicious lesions. Neurologic: Grossly intact, no focal deficits, moving all 4 extremities.  Psychiatric: Normal mood and affect.  Laboratory Data: Lab Results  Component Value Date   WBC 5.9 12/14/2014   HGB 13.0 12/14/2014   HCT 39.6 12/14/2014   MCV 97 12/14/2014   PLT 208 12/14/2014    Lab Results  Component Value Date   CREATININE 0.71 12/14/2014    Urinalysis Results for orders placed or performed in visit on 10/28/15  Microscopic Examination  Result Value Ref Range   WBC, UA 0-5 0 -  5 /hpf   RBC, UA 3-10 (A) 0 -  2 /hpf   Epithelial Cells (non renal) 0-10 0 - 10 /hpf   Bacteria, UA Moderate (A) None seen/Few  Urinalysis, Complete  Result Value Ref Range   Specific Gravity, UA 1.020 1.005 - 1.030   pH, UA 7.0 5.0 - 7.5   Color, UA Yellow Yellow   Appearance Ur Clear  Clear   Leukocytes, UA Negative Negative   Protein, UA Negative Negative/Trace   Glucose, UA Negative Negative   Ketones, UA Negative Negative   RBC, UA Trace (A) Negative   Bilirubin, UA Negative Negative   Urobilinogen, Ur 0.2 0.2 - 1.0 mg/dL   Nitrite, UA Negative Negative   Microscopic Examination See below:      Cystoscopy Procedure Note  Patient identification was confirmed, informed consent was obtained, and patient was prepped using Betadine solution.  Lidocaine jelly was administered per urethral meatus.    Preoperative abx where received prior to procedure.    Procedure: - Flexible cystoscope introduced, without any difficulty.   - Thorough search of the bladder revealed:    normal urethral meatus    normal urothelium    no stones    no ulcers     no tumors    no urethral polyps    no trabeculation  - Ureteral orifices were normal in position and appearance.  Post-Procedure: - Patient tolerated the procedure well   Assessment & Plan:    1. 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  Most recent bx 03/2015 c/w BCG granuloma, no recurrence.  Cysto today fairly unremarkable. -RTC for cysto in 6 months -repeat cytology today - Urinalysis, Complete - lidocaine (XYLOCAINE) 2 % jelly 1 application; Place 1 application into the urethra once. - ciprofloxacin (CIPRO) tablet 500 mg; Take 1 tablet (500 mg total) by mouth once.   Return in about 6 months (around 04/26/2016) for cysto.  Hollice Espy, MD  [facility] [address] [place], [place] [address] [phone]    Addendum: Urine cytology negative

## 2015-11-04 ENCOUNTER — Encounter: Payer: Self-pay | Admitting: *Deleted

## 2015-11-05 ENCOUNTER — Encounter: Payer: Self-pay | Admitting: Urology

## 2015-11-05 NOTE — Discharge Instructions (Signed)

## 2015-11-06 ENCOUNTER — Ambulatory Visit: Payer: Medicare Other | Admitting: Anesthesiology

## 2015-11-06 ENCOUNTER — Ambulatory Visit
Admission: RE | Admit: 2015-11-06 | Discharge: 2015-11-06 | Disposition: A | Discharge status: Home / Self Care | Payer: Medicare Other | Source: Ambulatory Visit | Attending: Gastroenterology | Admitting: Gastroenterology

## 2015-11-06 ENCOUNTER — Encounter: Payer: Self-pay | Admitting: *Deleted

## 2015-11-06 ENCOUNTER — Encounter
Admission: RE | Disposition: A | Discharge status: Home / Self Care | Payer: Self-pay | Source: Ambulatory Visit | Attending: Gastroenterology

## 2015-11-06 DIAGNOSIS — Z90710 Acquired absence of both cervix and uterus: Secondary | ICD-10-CM | POA: Insufficient documentation

## 2015-11-06 DIAGNOSIS — Z7951 Long term (current) use of inhaled steroids: Secondary | ICD-10-CM | POA: Diagnosis not present

## 2015-11-06 DIAGNOSIS — Z86718 Personal history of other venous thrombosis and embolism: Secondary | ICD-10-CM | POA: Insufficient documentation

## 2015-11-06 DIAGNOSIS — K6289 Other specified diseases of anus and rectum: Secondary | ICD-10-CM | POA: Insufficient documentation

## 2015-11-06 DIAGNOSIS — E7800 Pure hypercholesterolemia, unspecified: Secondary | ICD-10-CM | POA: Insufficient documentation

## 2015-11-06 DIAGNOSIS — Z7982 Long term (current) use of aspirin: Secondary | ICD-10-CM | POA: Insufficient documentation

## 2015-11-06 DIAGNOSIS — K589 Irritable bowel syndrome without diarrhea: Secondary | ICD-10-CM | POA: Insufficient documentation

## 2015-11-06 DIAGNOSIS — Z818 Family history of other mental and behavioral disorders: Secondary | ICD-10-CM | POA: Diagnosis not present

## 2015-11-06 DIAGNOSIS — K5730 Diverticulosis of large intestine without perforation or abscess without bleeding: Secondary | ICD-10-CM | POA: Diagnosis not present

## 2015-11-06 DIAGNOSIS — G3184 Mild cognitive impairment, so stated: Secondary | ICD-10-CM | POA: Diagnosis not present

## 2015-11-06 DIAGNOSIS — Z8551 Personal history of malignant neoplasm of bladder: Secondary | ICD-10-CM | POA: Diagnosis not present

## 2015-11-06 DIAGNOSIS — Z809 Family history of malignant neoplasm, unspecified: Secondary | ICD-10-CM | POA: Diagnosis not present

## 2015-11-06 DIAGNOSIS — J302 Other seasonal allergic rhinitis: Secondary | ICD-10-CM | POA: Insufficient documentation

## 2015-11-06 DIAGNOSIS — Z79899 Other long term (current) drug therapy: Secondary | ICD-10-CM | POA: Insufficient documentation

## 2015-11-06 DIAGNOSIS — Z9889 Other specified postprocedural states: Secondary | ICD-10-CM | POA: Diagnosis not present

## 2015-11-06 DIAGNOSIS — Z888 Allergy status to other drugs, medicaments and biological substances status: Secondary | ICD-10-CM | POA: Diagnosis not present

## 2015-11-06 DIAGNOSIS — R159 Full incontinence of feces: Secondary | ICD-10-CM | POA: Diagnosis present

## 2015-11-06 HISTORY — PX: COLONOSCOPY: SHX5424

## 2015-11-06 HISTORY — DX: Presence of dental prosthetic device (complete) (partial): Z97.2

## 2015-11-06 HISTORY — DX: Unspecified osteoarthritis, unspecified site: M19.90

## 2015-11-06 SURGERY — COLONOSCOPY
Anesthesia: Monitor Anesthesia Care

## 2015-11-06 MED ORDER — STERILE WATER FOR IRRIGATION IR SOLN
Status: DC | PRN
  Administered 2015-11-06: 09:00:00

## 2015-11-06 MED ORDER — PROPOFOL 10 MG/ML IV BOLUS
INTRAVENOUS | Status: DC | PRN
  Administered 2015-11-06 (×3): 20 mg via INTRAVENOUS
  Administered 2015-11-06: 60 mg via INTRAVENOUS
  Administered 2015-11-06: 20 mg via INTRAVENOUS

## 2015-11-06 MED ORDER — LACTATED RINGERS IV SOLN
INTRAVENOUS | Status: DC
  Administered 2015-11-06 (×2): via INTRAVENOUS

## 2015-11-06 MED ORDER — LIDOCAINE HCL (CARDIAC) 20 MG/ML IV SOLN
INTRAVENOUS | Status: DC | PRN
  Administered 2015-11-06: 20 mg via INTRAVENOUS

## 2015-11-06 MED ORDER — SODIUM CHLORIDE 0.9 % IV SOLN: INTRAVENOUS | Status: DC

## 2015-11-06 SURGICAL SUPPLY — 30 items
CANISTER SUCT 1200ML W/VALVE (MISCELLANEOUS) ×3 IMPLANT
FCP ESCP3.2XJMB 240X2.8X (MISCELLANEOUS)
FORCEPS BIOP RAD 4 LRG CAP 4 (CUTTING FORCEPS) IMPLANT
FORCEPS BIOP RJ4 240 W/NDL (MISCELLANEOUS)
FORCEPS ESCP3.2XJMB 240X2.8X (MISCELLANEOUS) IMPLANT
GOWN CVR UNV OPN BCK APRN NK (MISCELLANEOUS) ×1 IMPLANT
GOWN ISOL THUMB LOOP REG UNIV (MISCELLANEOUS) ×2
GOWN STRL REUS W/ TWL LRG LVL3 (GOWN DISPOSABLE) ×1 IMPLANT
GOWN STRL REUS W/TWL LRG LVL3 (GOWN DISPOSABLE) ×2
HEMOCLIP INSTINCT (CLIP) IMPLANT
INJECTOR VARIJECT VIN23 (MISCELLANEOUS) IMPLANT
KIT CO2 TUBING (TUBING) IMPLANT
KIT DEFENDO VALVE AND CONN (KITS) IMPLANT
KIT ENDO PROCEDURE OLY (KITS) ×3 IMPLANT
LIGATOR MULTIBAND 6SHOOTER MBL (MISCELLANEOUS) IMPLANT
MARKER SPOT ENDO TATTOO 5ML (MISCELLANEOUS) IMPLANT
PAD GROUND ADULT SPLIT (MISCELLANEOUS) IMPLANT
SNARE SHORT THROW 13M SML OVAL (MISCELLANEOUS) IMPLANT
SNARE SHORT THROW 30M LRG OVAL (MISCELLANEOUS) IMPLANT
SPOT EX ENDOSCOPIC TATTOO (MISCELLANEOUS)
SUCTION POLY TRAP 4CHAMBER (MISCELLANEOUS) IMPLANT
TRAP SUCTION POLY (MISCELLANEOUS) IMPLANT
TUBING CONN 6MMX3.1M (TUBING)
TUBING SUCTION CONN 0.25 STRL (TUBING) IMPLANT
UNDERPAD 30X60 958B10 (PK) (MISCELLANEOUS) IMPLANT
VALVE BIOPSY ENDO (VALVE) IMPLANT
VARIJECT INJECTOR VIN23 (MISCELLANEOUS)
WATER AUXILLARY (MISCELLANEOUS) IMPLANT
WATER STERILE IRR 250ML POUR (IV SOLUTION) IMPLANT
WATER STERILE IRR 500ML POUR (IV SOLUTION) IMPLANT

## 2015-11-06 NOTE — H&P (Signed)
  Date of Initial H&P: 10/31/2015  History reviewed, patient examined, no change in status, stable for surgery.

## 2015-11-06 NOTE — Transfer of Care (Signed)
Immediate Anesthesia Transfer of Care Note  Patient: Gabrielle Crosby  Procedure(s) Performed: Procedure(s): COLONOSCOPY (N/A)  Patient Location: PACU  Anesthesia Type: MAC  Level of Consciousness: awake, alert  and patient cooperative  Airway and Oxygen Therapy: Patient Spontanous Breathing and Patient connected to supplemental oxygen  Post-op Assessment: Post-op Vital signs reviewed, Patient's Cardiovascular Status Stable, Respiratory Function Stable, Patent Airway and No signs of Nausea or vomiting  Post-op Vital Signs: Reviewed and stable  Complications: No apparent anesthesia complications

## 2015-11-06 NOTE — Anesthesia Preprocedure Evaluation (Signed)
Anesthesia Evaluation  Patient identified by MRN, date of birth, ID band  Reviewed: Allergy & Precautions, NPO status , Patient's Chart, lab work & pertinent test results, reviewed documented beta blocker date and time   Airway Mallampati: II  TM Distance: >3 FB     Dental  (+) Lower Dentures, Upper Dentures   Pulmonary           Cardiovascular hypertension, Pt. on medications      Neuro/Psych    GI/Hepatic   Endo/Other    Renal/GU      Musculoskeletal  (+) Arthritis , Osteoarthritis,    Abdominal   Peds  Hematology   Anesthesia Other Findings   Reproductive/Obstetrics                             Anesthesia Physical Anesthesia Plan  ASA: II  Anesthesia Plan: MAC   Post-op Pain Management:    Induction:   Airway Management Planned:   Additional Equipment:   Intra-op Plan:   Post-operative Plan:   Informed Consent: I have reviewed the patients History and Physical, chart, labs and discussed the procedure including the risks, benefits and alternatives for the proposed anesthesia with the patient or authorized representative who has indicated his/her understanding and acceptance.     Plan Discussed with: CRNA  Anesthesia Plan Comments:         Anesthesia Quick Evaluation

## 2015-11-06 NOTE — Anesthesia Postprocedure Evaluation (Signed)
Anesthesia Post Note  Patient: Gabrielle Crosby  Procedure(s) Performed: Procedure(s) (LRB): COLONOSCOPY (N/A)  Patient location during evaluation: PACU Anesthesia Type: General Level of consciousness: awake and alert Pain management: pain level controlled Vital Signs Assessment: post-procedure vital signs reviewed and stable Respiratory status: spontaneous breathing, nonlabored ventilation, respiratory function stable and patient connected to nasal cannula oxygen Cardiovascular status: blood pressure returned to baseline and stable Postop Assessment: no signs of nausea or vomiting Anesthetic complications: no    Marshell Levan

## 2015-11-06 NOTE — Op Note (Signed)
[facility]astroenterology Patient Name: Gabrielle Crosby Procedure Date: 11/06/2015 9:16 AM MRN: [id number] Account #: [id number] Date of Birth: [birth year] Admit Type: Outpatient Age: 81 Room: [facility] OR ROOM 01 Gender: Female Note Status: Finalized Procedure:         Colonoscopy Indications:       fecal incontinence, rectal pain Providers:         Lupita Dawn. Candace Cruise, MD Referring MD:      Shirline Frees (Referring MD) Medicines:         Monitored Anesthesia Care Complications:     No immediate complications. Procedure:         Pre-Anesthesia Assessment:                    - Prior to the procedure, a History and Physical was                     performed, and patient medications, allergies and                     sensitivities were reviewed. The patient's tolerance of                     previous anesthesia was reviewed.                    - The risks and benefits of the procedure and the sedation                     options and risks were discussed with the patient. All                     questions were answered and informed consent was obtained.                    - After reviewing the risks and benefits, the patient was                     deemed in satisfactory condition to undergo the procedure.                    After obtaining informed consent, the colonoscope was                     passed under direct vision. Throughout the procedure, the                     patient's blood pressure, pulse, and oxygen saturations                     were monitored continuously. The was introduced through                     the anus and advanced to the the cecum, identified by                     appendiceal orifice and ileocecal valve. The colonoscopy                     was performed without difficulty. The patient tolerated                     the procedure well. The quality of the bowel preparation  was good. Findings:      Slightly decreased  sphincter tone.      Multiple small and large-mouthed diverticula were found in the sigmoid       colon.      The exam was otherwise without abnormality. Impression:        - Diverticulosis in the sigmoid colon.                    - The examination was otherwise normal.                    - No specimens collected. Recommendation:    - Discharge patient to home.                    - High fiber diet daily.                    - The findings and recommendations were discussed with the                     patient's family.                    - Daily fiber supplements. Imodium prn if no relief. If                     medical treatments do not work, then schedule Immunologist. Procedure Code(s): --- Professional ---                    [phone], Colonoscopy, flexible; diagnostic, including                     collection of specimen(s) by brushing or washing, when                     performed (separate procedure) Diagnosis Code(s): --- Professional ---                    K57.30, Diverticulosis of large intestine without                     perforation or abscess without bleeding CPT copyright 2014 American Medical Association. All rights reserved. The codes documented in this report are preliminary and upon coder review may  be revised to meet current compliance requirements. Hulen Luster, MD 11/06/2015 9:46:18 AM This report has been signed electronically. Number of Addenda: 0 Note Initiated On: 11/06/2015 9:16 AM Scope Withdrawal Time: 0 hours 8 minutes 46 seconds  Total Procedure Duration: 0 hours 13 minutes 2 seconds       [facility]

## 2015-11-07 ENCOUNTER — Encounter: Payer: Self-pay | Admitting: Gastroenterology

## 2016-04-28 ENCOUNTER — Other Ambulatory Visit: Payer: Medicare Other | Admitting: Urology

## 2016-04-30 ENCOUNTER — Other Ambulatory Visit: Payer: Medicare Other | Admitting: Urology

## 2016-06-05 ENCOUNTER — Ambulatory Visit (INDEPENDENT_AMBULATORY_CARE_PROVIDER_SITE_OTHER): Payer: Medicare Other | Admitting: Urology

## 2016-06-05 VITALS — Ht 61.0 in | Wt 112.0 lb

## 2016-06-05 DIAGNOSIS — C679 Malignant neoplasm of bladder, unspecified: Secondary | ICD-10-CM | POA: Diagnosis not present

## 2016-06-05 LAB — URINALYSIS, COMPLETE
BILIRUBIN UA: NEGATIVE
Glucose, UA: NEGATIVE
Ketones, UA: NEGATIVE
Leukocytes, UA: NEGATIVE
NITRITE UA: NEGATIVE
PROTEIN UA: NEGATIVE
Specific Gravity, UA: 1.02 (ref 1.005–1.030)
UUROB: 0.2 mg/dL (ref 0.2–1.0)
pH, UA: 6.5 (ref 5.0–7.5)

## 2016-06-05 LAB — MICROSCOPIC EXAMINATION: BACTERIA UA: NONE SEEN

## 2016-06-05 MED ORDER — CIPROFLOXACIN HCL 500 MG PO TABS
500.0000 mg | ORAL_TABLET | Freq: Once | ORAL | Status: AC
  Administered 2016-06-05: 500 mg via ORAL

## 2016-06-05 MED ORDER — LIDOCAINE HCL 2 % EX GEL
1.0000 "application " | Freq: Once | CUTANEOUS | Status: AC
  Administered 2016-06-05: 1 via URETHRAL

## 2016-06-05 NOTE — Progress Notes (Signed)
10:48 AM  06/05/2016   Gabrielle Crosby October 09, 1933 [id number]  Referring provider: Sherrin Daisy, MD [place], [place] [id number]  Chief Complaint  Patient presents with  . Cysto    HPI: 82 yo F with history of bladder cancer first dx on 12/2013. 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 She was counseled to undergo BCG, but however, due to national back order, she underwent mitomycin x 3.  Once available, she completed BCG x 6 from 11/23-12/28/15.   Most recently, she returned to the OR on 04/23/2015 after some nonspecific patchy erythema was identified on surveillance cystoscopy in the setting of atypical urine cytology. Biopsy results were negative for any malignancy (BCG granuloma).  She returns today for office cystoscopy. Last cysto 6 months ago negative.  Today, she denies any gross hematuria or voiding issues. She unfortunately lost her husband and has been living with her daughter. Since last visit, her memory and dementia issues have progressed significantly.    PMH: Past Medical History  Diagnosis Date  . Hypertension   . Cancer ([place])     BLADDER X 2  . Dementia   . Hyperlipidemia   . Bladder cancer ([place])   . Wears dentures     partial upper and lower  . Arthritis     hands    Surgical History: Past Surgical History  Procedure Laterality Date  . Abdominal hysterectomy Bilateral     BILATERAL s&o  . Bladder surgery N/A     REMOVAL OF BLADDER CANCER X 2  . Bowel resection N/A     SMALL BOWEL OBSTRUCTION  . Cystoscopy with biopsy N/A 04/17/2015    Procedure: CYSTOSCOPY WITH BIOPSY;  Surgeon: Hollice Espy, MD;  Location: [facility] ORS;  Service: Urology;  Laterality: N/A;  . Colonoscopy N/A 11/06/2015    Procedure: COLONOSCOPY;  Surgeon: Hulen Luster, MD;  Location: [place];  Service:  Gastroenterology;  Laterality: N/A;    Home Medications:    Medication List       This list is accurate as of: 06/05/16 10:48 AM.  Always use your most recent med list.               ALPRAZolam 0.25 MG tablet  Commonly known as:  XANAX  Take 0.25 mg by mouth at bedtime as needed for anxiety.     lovastatin 20 MG tablet  Commonly known as:  MEVACOR  TAKE 1 TABLET (20 MG TOTAL) BY MOUTH DAILY WITH DINNER.        Allergies:  Allergies  Allergen Reactions  . Atenolol Other (See Comments)    Bradycardia   . Donepezil Hcl Other (See Comments)    Mental changes, "made me crazy"    Family History: Family History  Problem Relation Age of Onset  . Family history unknown: Yes    Social History:  reports that she has never smoked. She does not have any smokeless tobacco history on file. She reports that she does not drink alcohol or use illicit drugs.   Physical Exam: Ht 5\' 1"  (1.549 m)  Wt 112 lb (50.803 kg)  BMI 21.17 kg/m2  Constitutional:  Alert and oriented, No acute distress. HEENT: [place] AT, moist mucus membranes.  Trachea midline, no masses. Cardiovascular: No clubbing, cyanosis, or edema. Respiratory: Normal respiratory effort, no increased work of breathing. GI: Abdomen  is soft, nontender, nondistended, no abdominal masses GU: No CVA tenderness. Normal external female genitalia, normal urethral meatus. Skin: No rashes, bruises or suspicious lesions. Neurologic: Grossly intact, no focal deficits, moving all 4 extremities. Psychiatric: Normal mood and affect.  Laboratory Data: Lab Results  Component Value Date   WBC 5.9 12/14/2014   HGB 13.0 12/14/2014   HCT 39.6 12/14/2014   MCV 97 12/14/2014   PLT 208 12/14/2014    Lab Results  Component Value Date   CREATININE 0.71 12/14/2014    Urinalysis UA reviewed, see epic  Cystoscopy Procedure Note  Patient identification was confirmed, informed consent was obtained, and patient was prepped using Betadine  solution.  Lidocaine jelly was administered per urethral meatus.    Preoperative abx where received prior to procedure.    Procedure: - Flexible cystoscope introduced, without any difficulty.   - Thorough search of the bladder revealed:    normal urethral meatus    normal urothelium    no stones    no ulcers     no tumors    no urethral polyps    no trabeculation  - Ureteral orifices were normal in position and appearance.  Post-Procedure: - Patient tolerated the procedure well   Assessment & Plan:    1. 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  Most recent bx 03/2015 c/w BCG granuloma, no recurrence.  Cysto today fairly unremarkable. -RTC for cysto in 6 months -repeat cytology today - Urinalysis, Complete - lidocaine (XYLOCAINE) 2 % jelly 1 application; Place 1 application into the urethra once. - ciprofloxacin (CIPRO) tablet 500 mg; Take 1 tablet (500 mg total) by mouth once.   Return in about 6 months (around 12/06/2016) for cystoscopy.  Hollice Espy, MD  [facility] [address] [place],  [address] [phone]    Addendum: Urine cytology negative

## 2016-06-26 ENCOUNTER — Other Ambulatory Visit: Payer: Self-pay | Admitting: Urology

## 2016-06-26 DIAGNOSIS — F419 Anxiety disorder, unspecified: Secondary | ICD-10-CM | POA: Insufficient documentation

## 2016-07-26 DIAGNOSIS — R413 Other amnesia: Secondary | ICD-10-CM | POA: Insufficient documentation

## 2016-12-04 ENCOUNTER — Encounter: Payer: Self-pay | Admitting: Urology

## 2016-12-04 ENCOUNTER — Ambulatory Visit (INDEPENDENT_AMBULATORY_CARE_PROVIDER_SITE_OTHER): Payer: Medicare Other | Admitting: Urology

## 2016-12-04 VITALS — BP 160/76 | HR 88 | Ht 61.0 in | Wt 104.0 lb

## 2016-12-04 DIAGNOSIS — R8299 Other abnormal findings in urine: Secondary | ICD-10-CM

## 2016-12-04 DIAGNOSIS — C679 Malignant neoplasm of bladder, unspecified: Secondary | ICD-10-CM | POA: Diagnosis not present

## 2016-12-04 DIAGNOSIS — R3129 Other microscopic hematuria: Secondary | ICD-10-CM

## 2016-12-04 DIAGNOSIS — R82998 Other abnormal findings in urine: Secondary | ICD-10-CM

## 2016-12-04 LAB — URINALYSIS, COMPLETE
Bilirubin, UA: NEGATIVE
GLUCOSE, UA: NEGATIVE
KETONES UA: NEGATIVE
Nitrite, UA: NEGATIVE
PH UA: 5.5 (ref 5.0–7.5)
PROTEIN UA: NEGATIVE
SPEC GRAV UA: 1.025 (ref 1.005–1.030)
Urobilinogen, Ur: 1 mg/dL (ref 0.2–1.0)

## 2016-12-04 LAB — MICROSCOPIC EXAMINATION: BACTERIA UA: NONE SEEN

## 2016-12-04 MED ORDER — CIPROFLOXACIN HCL 500 MG PO TABS
500.0000 mg | ORAL_TABLET | Freq: Once | ORAL | Status: AC
  Administered 2016-12-04: 500 mg via ORAL

## 2016-12-04 MED ORDER — LIDOCAINE HCL 2 % EX GEL
1.0000 "application " | Freq: Once | CUTANEOUS | Status: AC
  Administered 2016-12-04: 1 via URETHRAL

## 2016-12-04 NOTE — Progress Notes (Addendum)
9:29 AM  12/04/16   Gabrielle Crosby [birth year] [id number]  Referring provider: Sherrin Daisy, MD [place], [place] [id number]  Chief Complaint  Patient presents with  . Cysto    HPI: 83 yo F with history of bladder cancer first dx on 12/2013. 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 She was counseled to undergo BCG, but however, due to national back order, she underwent mitomycin x 3.  Once available, she completed BCG x 6 from 11/23-12/28/15.   Most recently, she returned to the OR on 04/23/2015 after some nonspecific patchy erythema was identified on surveillance cystoscopy in the setting of atypical urine cytology. Biopsy results were negative for any malignancy (BCG granuloma).  She returns today for office cystoscopy. Last cysto 6 months ago negative.  Today, she denies any gross hematuria or voiding issues. She continues to have memory and dementia issues.  She is accompanied by her daughter today and is now living with her.  UA today with 6-10 white blood cells per high-powered field, 3-10 red blood cells per high-powered field, 0-10 epithelials, calcium oxalate crystals present. Otherwise unremarkable.   PMH: Past Medical History:  Diagnosis Date  . Arthritis    hands  . Bladder cancer ([place])   . Cancer ([place])    BLADDER X 2  . Dementia   . Hyperlipidemia   . Hypertension   . Wears dentures    partial upper and lower    Surgical History: Past Surgical History:  Procedure Laterality Date  . ABDOMINAL HYSTERECTOMY Bilateral    BILATERAL s&o  . BLADDER SURGERY N/A    REMOVAL OF BLADDER CANCER X 2  . BOWEL RESECTION N/A    SMALL BOWEL OBSTRUCTION  . COLONOSCOPY N/A 11/06/2015   Procedure: COLONOSCOPY;  Surgeon: Hulen Luster, MD;  Location: [place];  Service: Gastroenterology;  Laterality:  N/A;  . CYSTOSCOPY WITH BIOPSY N/A 04/17/2015   Procedure: CYSTOSCOPY WITH BIOPSY;  Surgeon: Hollice Espy, MD;  Location: [facility] ORS;  Service: Urology;  Laterality: N/A;    Home Medications:  Allergies as of 12/04/2016      Reactions   Atenolol Other (See Comments)   Bradycardia    Donepezil Hcl Other (See Comments)   Mental changes, "made me crazy"      Medication List       Accurate as of 12/04/16  9:29 AM. Always use your most recent med list.          ALPRAZolam 0.25 MG tablet Commonly known as:  XANAX Take 0.25 mg by mouth at bedtime as needed for anxiety.   citalopram 20 MG tablet Commonly known as:  CELEXA Take by mouth.   lovastatin 20 MG tablet Commonly known as:  MEVACOR TAKE 1 TABLET (20 MG TOTAL) BY MOUTH DAILY WITH DINNER.   memantine 10 MG tablet Commonly known as:  NAMENDA Take by mouth.       Allergies:  Allergies  Allergen Reactions  . Atenolol Other (See Comments)    Bradycardia   . Donepezil Hcl Other (See Comments)    Mental changes, "made me crazy"    Family History: Family History  Problem Relation Age of Onset  . Family history unknown: Yes    Social History:  reports that she has never smoked. She has never used smokeless tobacco. She reports that she does not  drink alcohol or use drugs.   Physical Exam: BP (!) 160/76 (BP Location: Right Arm, Patient Position: Sitting, Cuff Size: Normal)   Pulse 88   Ht 5\' 1"  (1.549 m)   Wt 104 lb (47.2 kg)   BMI 19.65 kg/m   Constitutional:  Alert and oriented, No acute distress. HEENT: [place] AT, moist mucus membranes.  Trachea midline, no masses. Cardiovascular: No clubbing, cyanosis, or edema. Respiratory: Normal respiratory effort, no increased work of breathing. GI: Abdomen is soft, nontender, nondistended, no abdominal masses GU: No CVA tenderness. Normal external female genitalia, normal urethral meatus. Skin: No rashes, bruises or suspicious lesions. Neurologic: Grossly intact, no focal  deficits, moving all 4 extremities. Psychiatric: Normal mood and affect.  Laboratory Data: Lab Results  Component Value Date   WBC 5.9 12/14/2014   HGB 13.0 12/14/2014   HCT 39.6 12/14/2014   MCV 97 12/14/2014   PLT 208 12/14/2014    Lab Results  Component Value Date   CREATININE 0.71 12/14/2014    Urinalysis UA reviewed, see epic  Cystoscopy Procedure Note  Patient identification was confirmed, informed consent was obtained, and patient was prepped using Betadine solution.  Lidocaine jelly was administered per urethral meatus.    Preoperative abx where received prior to procedure.    Procedure: - Flexible cystoscope introduced, without any difficulty.   - Thorough search of the bladder revealed:    normal urethral meatus    normal urothelium    no stones    no ulcers     no tumors    no urethral polyps    no trabeculation  - Ureteral orifices were normal in position and appearance.  Post-Procedure: - Patient tolerated the procedure well   Assessment & Plan:    1. 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  Most recent bx 03/2015 c/w BCG granuloma, no recurrence.  Cysto today fairly unremarkable. -RTC for cysto in 6 months -repeat cytology today - Urinalysis, Complete - lidocaine (XYLOCAINE) 2 % jelly 1 application; Place 1 application into the urethra once. - ciprofloxacin (CIPRO) tablet 500 mg; Take 1 tablet (500 mg total) by mouth once.  2. Microscopic hematuria Incidental microscopic hematuria today. Calcium oxalate crystals also present. At the quite some time since she has had upper tract imaging to proceed with renal ultrasound in the interim  3. Calcium oxalate crystals in urine  Denies any flank pain   Return for cystoscopy (will call with RUS results).  Hollice Espy, MD  [facility] [address],  [place], [place] [address] [phone]   Addendum: Urine cytology negative.

## 2016-12-14 ENCOUNTER — Other Ambulatory Visit: Payer: Self-pay | Admitting: Urology

## 2016-12-22 ENCOUNTER — Ambulatory Visit
Admission: RE | Admit: 2016-12-22 | Discharge: 2016-12-22 | Disposition: A | Discharge status: Home / Self Care | Payer: Medicare Other | Source: Ambulatory Visit | Attending: Urology | Admitting: Urology

## 2016-12-22 DIAGNOSIS — R3129 Other microscopic hematuria: Secondary | ICD-10-CM | POA: Insufficient documentation

## 2016-12-22 DIAGNOSIS — C679 Malignant neoplasm of bladder, unspecified: Secondary | ICD-10-CM | POA: Insufficient documentation

## 2016-12-22 DIAGNOSIS — R8299 Other abnormal findings in urine: Secondary | ICD-10-CM | POA: Diagnosis present

## 2016-12-22 DIAGNOSIS — N281 Cyst of kidney, acquired: Secondary | ICD-10-CM | POA: Insufficient documentation

## 2016-12-22 DIAGNOSIS — R82998 Other abnormal findings in urine: Secondary | ICD-10-CM

## 2017-01-06 ENCOUNTER — Telehealth: Payer: Self-pay

## 2017-01-06 NOTE — Telephone Encounter (Signed)
Spoke with pt daughter in reference to RUS results. Daughter voiced understanding.

## 2017-01-06 NOTE — Telephone Encounter (Signed)
LMOM

## 2017-01-06 NOTE — Telephone Encounter (Signed)
-----   Message from Hollice Espy, MD sent at 01/05/2017  8:37 PM EST ----- Please let this patient and her daughter (healthcare power of attorney) know that her renal ultrasound looked great.   Hollice Espy, MD

## 2017-06-09 ENCOUNTER — Ambulatory Visit (INDEPENDENT_AMBULATORY_CARE_PROVIDER_SITE_OTHER): Payer: Medicare Other | Admitting: Urology

## 2017-06-09 ENCOUNTER — Encounter: Payer: Self-pay | Admitting: Urology

## 2017-06-09 VITALS — BP 170/67 | HR 90 | Ht 61.0 in | Wt 102.0 lb

## 2017-06-09 DIAGNOSIS — C679 Malignant neoplasm of bladder, unspecified: Secondary | ICD-10-CM

## 2017-06-09 LAB — URINALYSIS, COMPLETE
BILIRUBIN UA: NEGATIVE
Glucose, UA: NEGATIVE
Ketones, UA: NEGATIVE
Nitrite, UA: NEGATIVE
PH UA: 6 (ref 5.0–7.5)
Protein, UA: NEGATIVE
Specific Gravity, UA: 1.015 (ref 1.005–1.030)
Urobilinogen, Ur: 0.2 mg/dL (ref 0.2–1.0)

## 2017-06-09 LAB — MICROSCOPIC EXAMINATION
Bacteria, UA: NONE SEEN
RBC, UA: NONE SEEN /hpf (ref 0–?)

## 2017-06-09 MED ORDER — LIDOCAINE HCL 2 % EX GEL
1.0000 "application " | Freq: Once | CUTANEOUS | Status: AC
  Administered 2017-06-09: 1 via URETHRAL

## 2017-06-09 MED ORDER — CIPROFLOXACIN HCL 500 MG PO TABS
500.0000 mg | ORAL_TABLET | Freq: Once | ORAL | Status: AC
  Administered 2017-06-09: 500 mg via ORAL

## 2017-06-09 NOTE — Progress Notes (Signed)
9:30 AM  06/09/17   Gabrielle Crosby [birth year] [id number]  Referring provider: Sherrin Daisy, MD No address on file  Chief Complaint  Patient presents with  . Cysto    71month    HPI: 83 yo F with history of bladder cancer first dx on 12/2013. 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 She was counseled to undergo BCG, but however, due to national back order, she underwent mitomycin x 3.  Once available, she completed BCG x 6 from 11/23-12/28/15.   She returned to the OR on 04/23/2015 after some nonspecific patchy erythema was identified on surveillance cystoscopy in the setting of atypical urine cytology. Biopsy results were negative for any malignancy (BCG granuloma).  She returns today for office cystoscopy. Last cysto 6 months ago negative with negative cytology.  Most recent upper tract imaging in the form of renal ultrasound in 11/2016. She had about 1.9 cm right simple renal cyst otherwise her kidneys were unremarkable. There is a questionable small focal lesion of the right posterior bladder wall which was not seen cystoscopically.  Today, she denies any gross hematuria or voiding issues. She continues to have increasing memory and dementia issues.   She does not recall leaving a urinalysis just a few minutes ago. She is accompanied by her daughter today.   PMH: Past Medical History:  Diagnosis Date  . Arthritis    hands  . Bladder cancer ([place])   . Cancer ([place])    BLADDER X 2  . Dementia   . Hyperlipidemia   . Hypertension   . Wears dentures    partial upper and lower    Surgical History: Past Surgical History:  Procedure Laterality Date  . ABDOMINAL HYSTERECTOMY Bilateral    BILATERAL s&o  . BLADDER SURGERY N/A    REMOVAL OF BLADDER CANCER X 2  . BOWEL RESECTION N/A    SMALL BOWEL OBSTRUCTION  .  COLONOSCOPY N/A 11/06/2015   Procedure: COLONOSCOPY;  Surgeon: Hulen Luster, MD;  Location: [facility];  Service: Gastroenterology;  Laterality: N/A;  . CYSTOSCOPY WITH BIOPSY N/A 04/17/2015   Procedure: CYSTOSCOPY WITH BIOPSY;  Surgeon: Hollice Espy, MD;  Location: [facility];  Service: Urology;  Laterality: N/A;    Home Medications:  Allergies as of 06/09/2017      Reactions   Atenolol Other (See Comments)   Bradycardia    Donepezil Hcl Other (See Comments)   Mental changes, "made me crazy"      Medication List       Accurate as of 06/09/17  9:30 AM. Always use your most recent med list.          ALPRAZolam 0.25 MG tablet Commonly known as:  XANAX Take 0.25 mg by mouth at bedtime as needed for anxiety.   citalopram 20 MG tablet Commonly known as:  CELEXA Take by mouth.   lovastatin 20 MG tablet Commonly known as:  MEVACOR TAKE 1 TABLET (20 MG TOTAL) BY MOUTH DAILY WITH DINNER.   memantine 10 MG tablet Commonly known as:  NAMENDA Take by mouth.       Allergies:  Allergies  Allergen Reactions  . Atenolol Other (See Comments)    Bradycardia   . Donepezil Hcl Other (See Comments)    Mental changes, "made me crazy"    Family History: Family History  Problem Relation Age of Onset  .  Family history unknown: Yes    Social History:  reports that she has never smoked. She has never used smokeless tobacco. She reports that she does not drink alcohol or use drugs.   Physical Exam: BP (!) 170/67   Pulse 90   Ht 5\' 1"  (1.549 m)   Wt 102 lb (46.3 kg)   BMI 19.27 kg/m   Constitutional:  Alert and oriented, No acute distress. HEENT: [place] AT, moist mucus membranes.  Trachea midline, no masses. Cardiovascular: No clubbing, cyanosis, or edema. Respiratory: Normal respiratory effort, no increased work of breathing. GI: Abdomen is soft, nontender, nondistended, no abdominal masses GU: No CVA tenderness. Normal external female genitalia, normal urethral  meatus. Skin: No rashes, bruises or suspicious lesions. Neurologic: Grossly intact, no focal deficits, moving all 4 extremities.  Severe memory impairment noted. Psychiatric: Normal mood and affect.  Laboratory Data: Lab Results  Component Value Date   WBC 5.9 12/14/2014   HGB 13.0 12/14/2014   HCT 39.6 12/14/2014   MCV 97 12/14/2014   PLT 208 12/14/2014    Lab Results  Component Value Date   CREATININE 0.71 12/14/2014    Urinalysis UA reviewed, see epic  Cystoscopy Procedure Note  Patient identification was confirmed, informed consent was obtained, and patient was prepped using Betadine solution.  Lidocaine jelly was administered per urethral meatus.    Preoperative abx where received prior to procedure.    Procedure: - Flexible cystoscope introduced, without any difficulty.   - Thorough search of the bladder revealed:    normal urethral meatus    normal urothelium, stellate well-healed scar on posterior bladder wall    no stones    no ulcers     no tumors    no urethral polyps    no trabeculation  - Ureteral orifices were normal in position and appearance.  Post-Procedure: - Patient tolerated the procedure well   Assessment & Plan:    1. 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  Most recent bx 03/2015 c/w BCG granuloma, no recurrence.  Cysto today fairly unremarkable. -RTC for cysto in 12 months (extended interval to annually given last recurrence 3 years ago as well as worsening cognitive decline) -repeat cytology today - Urinalysis, Complete - lidocaine (XYLOCAINE) 2 % jelly 1 application; Place 1 application into the urethra once. - ciprofloxacin (CIPRO) tablet 500 mg; Take 1 tablet (500 mg total) by mouth once.    Return for cysto/ cytology.  Hollice Espy, MD  [facility] [address] [place], [place] [address] [phone]

## 2017-06-21 ENCOUNTER — Other Ambulatory Visit: Payer: Self-pay | Admitting: Urology

## 2017-11-18 ENCOUNTER — Other Ambulatory Visit: Payer: Self-pay | Admitting: Nurse Practitioner

## 2017-11-18 DIAGNOSIS — G44091 Other trigeminal autonomic cephalgias (TAC), intractable: Secondary | ICD-10-CM

## 2017-11-30 DIAGNOSIS — I10 Essential (primary) hypertension: Secondary | ICD-10-CM

## 2017-11-30 DIAGNOSIS — I639 Cerebral infarction, unspecified: Secondary | ICD-10-CM

## 2017-11-30 DIAGNOSIS — I619 Nontraumatic intracerebral hemorrhage, unspecified: Secondary | ICD-10-CM

## 2017-11-30 DIAGNOSIS — F0390 Unspecified dementia without behavioral disturbance: Secondary | ICD-10-CM

## 2017-11-30 HISTORY — DX: Essential (primary) hypertension: I10

## 2017-11-30 HISTORY — DX: Cerebral infarction, unspecified: I63.9

## 2017-11-30 HISTORY — DX: Unspecified dementia, unspecified severity, without behavioral disturbance, psychotic disturbance, mood disturbance, and anxiety: F03.90

## 2017-11-30 HISTORY — DX: Nontraumatic intracerebral hemorrhage, unspecified: I61.9

## 2017-12-15 ENCOUNTER — Other Ambulatory Visit: Payer: Self-pay | Admitting: Nurse Practitioner

## 2017-12-15 DIAGNOSIS — S32030A Wedge compression fracture of third lumbar vertebra, initial encounter for closed fracture: Secondary | ICD-10-CM

## 2017-12-16 ENCOUNTER — Ambulatory Visit
Admission: RE | Admit: 2017-12-16 | Discharge: 2017-12-16 | Disposition: A | Discharge status: Home / Self Care | Payer: Medicare Other | Source: Ambulatory Visit | Attending: Nurse Practitioner | Admitting: Nurse Practitioner

## 2017-12-16 DIAGNOSIS — M48061 Spinal stenosis, lumbar region without neurogenic claudication: Secondary | ICD-10-CM | POA: Diagnosis not present

## 2017-12-16 DIAGNOSIS — S32030A Wedge compression fracture of third lumbar vertebra, initial encounter for closed fracture: Secondary | ICD-10-CM | POA: Insufficient documentation

## 2017-12-16 DIAGNOSIS — M4316 Spondylolisthesis, lumbar region: Secondary | ICD-10-CM | POA: Diagnosis not present

## 2017-12-16 DIAGNOSIS — M47816 Spondylosis without myelopathy or radiculopathy, lumbar region: Secondary | ICD-10-CM | POA: Diagnosis not present

## 2017-12-16 DIAGNOSIS — X58XXXA Exposure to other specified factors, initial encounter: Secondary | ICD-10-CM | POA: Insufficient documentation

## 2017-12-20 ENCOUNTER — Ambulatory Visit: Payer: Medicare Other

## 2017-12-20 ENCOUNTER — Encounter
Admission: RE | Disposition: A | Discharge status: Home / Self Care | Payer: Self-pay | Source: Ambulatory Visit | Attending: Orthopedic Surgery

## 2017-12-20 ENCOUNTER — Encounter: Payer: Self-pay | Admitting: *Deleted

## 2017-12-20 ENCOUNTER — Other Ambulatory Visit: Payer: Self-pay

## 2017-12-20 ENCOUNTER — Ambulatory Visit
Admission: RE | Admit: 2017-12-20 | Discharge: 2017-12-20 | Disposition: A | Discharge status: Home / Self Care | Payer: Medicare Other | Source: Ambulatory Visit | Attending: Orthopedic Surgery | Admitting: Orthopedic Surgery

## 2017-12-20 ENCOUNTER — Ambulatory Visit: Payer: Medicare Other | Admitting: Anesthesiology

## 2017-12-20 DIAGNOSIS — Z8673 Personal history of transient ischemic attack (TIA), and cerebral infarction without residual deficits: Secondary | ICD-10-CM | POA: Insufficient documentation

## 2017-12-20 DIAGNOSIS — Z419 Encounter for procedure for purposes other than remedying health state, unspecified: Secondary | ICD-10-CM

## 2017-12-20 DIAGNOSIS — Z79899 Other long term (current) drug therapy: Secondary | ICD-10-CM | POA: Diagnosis not present

## 2017-12-20 DIAGNOSIS — Z7982 Long term (current) use of aspirin: Secondary | ICD-10-CM | POA: Insufficient documentation

## 2017-12-20 DIAGNOSIS — M4856XA Collapsed vertebra, not elsewhere classified, lumbar region, initial encounter for fracture: Secondary | ICD-10-CM | POA: Diagnosis not present

## 2017-12-20 DIAGNOSIS — F419 Anxiety disorder, unspecified: Secondary | ICD-10-CM | POA: Diagnosis not present

## 2017-12-20 DIAGNOSIS — Z8551 Personal history of malignant neoplasm of bladder: Secondary | ICD-10-CM | POA: Diagnosis not present

## 2017-12-20 DIAGNOSIS — F329 Major depressive disorder, single episode, unspecified: Secondary | ICD-10-CM | POA: Insufficient documentation

## 2017-12-20 DIAGNOSIS — Z86718 Personal history of other venous thrombosis and embolism: Secondary | ICD-10-CM | POA: Insufficient documentation

## 2017-12-20 DIAGNOSIS — I10 Essential (primary) hypertension: Secondary | ICD-10-CM | POA: Diagnosis not present

## 2017-12-20 DIAGNOSIS — E7800 Pure hypercholesterolemia, unspecified: Secondary | ICD-10-CM | POA: Insufficient documentation

## 2017-12-20 HISTORY — DX: Cerebral infarction, unspecified: I63.9

## 2017-12-20 HISTORY — PX: KYPHOPLASTY: SHX5884

## 2017-12-20 SURGERY — KYPHOPLASTY
Anesthesia: General

## 2017-12-20 MED ORDER — PROPOFOL 10 MG/ML IV BOLUS
INTRAVENOUS | Status: DC | PRN
  Administered 2017-12-20: 10 mg via INTRAVENOUS
  Administered 2017-12-20: 30 mg via INTRAVENOUS
  Administered 2017-12-20: 20 mg via INTRAVENOUS

## 2017-12-20 MED ORDER — IOPAMIDOL (ISOVUE-M 200) INJECTION 41%
INTRAMUSCULAR | Status: DC | PRN
  Administered 2017-12-20: 40 mL

## 2017-12-20 MED ORDER — ONDANSETRON HCL 4 MG PO TABS: 4.0000 mg | ORAL_TABLET | Freq: Four times a day (QID) | ORAL | Status: DC | PRN

## 2017-12-20 MED ORDER — HYDROCODONE-ACETAMINOPHEN 5-325 MG PO TABS: 1.0000 | ORAL_TABLET | ORAL | Status: DC | PRN

## 2017-12-20 MED ORDER — LIDOCAINE HCL 1 % IJ SOLN
INTRAMUSCULAR | Status: DC | PRN
  Administered 2017-12-20: 10 mL
  Administered 2017-12-20: 15 mL

## 2017-12-20 MED ORDER — IOPAMIDOL (ISOVUE-M 200) INJECTION 41%
INTRAMUSCULAR | Status: AC
  Filled 2017-12-20: qty 20

## 2017-12-20 MED ORDER — BUPIVACAINE-EPINEPHRINE (PF) 0.5% -1:200000 IJ SOLN
INTRAMUSCULAR | Status: DC | PRN
  Administered 2017-12-20: 15 mL

## 2017-12-20 MED ORDER — SODIUM CHLORIDE 0.9 % IV SOLN: INTRAVENOUS | Status: DC

## 2017-12-20 MED ORDER — METOCLOPRAMIDE HCL 5 MG/ML IJ SOLN: 5.0000 mg | Freq: Three times a day (TID) | INTRAMUSCULAR | Status: DC | PRN

## 2017-12-20 MED ORDER — CEFAZOLIN SODIUM-DEXTROSE 1-4 GM/50ML-% IV SOLN
INTRAVENOUS | Status: AC
  Filled 2017-12-20: qty 50

## 2017-12-20 MED ORDER — LIDOCAINE HCL (PF) 1 % IJ SOLN
INTRAMUSCULAR | Status: AC
  Filled 2017-12-20: qty 60

## 2017-12-20 MED ORDER — ONDANSETRON HCL 4 MG/2ML IJ SOLN: 4.0000 mg | Freq: Once | INTRAMUSCULAR | Status: DC | PRN

## 2017-12-20 MED ORDER — SEVOFLURANE IN SOLN
RESPIRATORY_TRACT | Status: AC
Start: 2017-12-20 — End: ?
  Filled 2017-12-20: qty 250

## 2017-12-20 MED ORDER — CEFAZOLIN (ANCEF) 1 G IV SOLR
1.0000 g | INTRAVENOUS | Status: DC
  Filled 2017-12-20: qty 1

## 2017-12-20 MED ORDER — CEFAZOLIN SODIUM-DEXTROSE 1-4 GM/50ML-% IV SOLN
1.0000 g | Freq: Once | INTRAVENOUS | Status: AC
  Administered 2017-12-20: 1 g via INTRAVENOUS

## 2017-12-20 MED ORDER — PROPOFOL 500 MG/50ML IV EMUL
INTRAVENOUS | Status: DC | PRN
  Administered 2017-12-20: 50 ug/kg/min via INTRAVENOUS

## 2017-12-20 MED ORDER — FENTANYL CITRATE (PF) 100 MCG/2ML IJ SOLN: 25.0000 ug | INTRAMUSCULAR | Status: DC | PRN

## 2017-12-20 MED ORDER — LACTATED RINGERS IV SOLN
INTRAVENOUS | Status: DC
  Administered 2017-12-20: 75 mL/h via INTRAVENOUS

## 2017-12-20 MED ORDER — MIDAZOLAM HCL 2 MG/2ML IJ SOLN
INTRAMUSCULAR | Status: AC
Start: 2017-12-20 — End: ?
  Filled 2017-12-20: qty 2

## 2017-12-20 MED ORDER — ONDANSETRON HCL 4 MG/2ML IJ SOLN: 4.0000 mg | Freq: Four times a day (QID) | INTRAMUSCULAR | Status: DC | PRN

## 2017-12-20 MED ORDER — PROPOFOL 500 MG/50ML IV EMUL
INTRAVENOUS | Status: AC
  Filled 2017-12-20: qty 50

## 2017-12-20 MED ORDER — BUPIVACAINE-EPINEPHRINE (PF) 0.5% -1:200000 IJ SOLN
INTRAMUSCULAR | Status: AC
  Filled 2017-12-20: qty 30

## 2017-12-20 MED ORDER — METOCLOPRAMIDE HCL 10 MG PO TABS: 5.0000 mg | ORAL_TABLET | Freq: Three times a day (TID) | ORAL | Status: DC | PRN

## 2017-12-20 SURGICAL SUPPLY — 16 items
CEMENT KYPHON CX01A KIT/MIXER (Cement) ×3 IMPLANT
DERMABOND ADVANCED (GAUZE/BANDAGES/DRESSINGS) ×2
DERMABOND ADVANCED .7 DNX12 (GAUZE/BANDAGES/DRESSINGS) ×1 IMPLANT
DEVICE BIOPSY BONE KYPHX (INSTRUMENTS) ×3 IMPLANT
DRAPE C-ARM XRAY 36X54 (DRAPES) ×3 IMPLANT
DURAPREP 26ML APPLICATOR (WOUND CARE) ×3 IMPLANT
GLOVE SURG SYN 9.0  PF PI (GLOVE) ×2
GLOVE SURG SYN 9.0 PF PI (GLOVE) ×1 IMPLANT
GOWN SRG 2XL LVL 4 RGLN SLV (GOWNS) ×1 IMPLANT
GOWN STRL NON-REIN 2XL LVL4 (GOWNS) ×2
GOWN STRL REUS W/ TWL LRG LVL3 (GOWN DISPOSABLE) ×1 IMPLANT
GOWN STRL REUS W/TWL LRG LVL3 (GOWN DISPOSABLE) ×2
PACK KYPHOPLASTY (MISCELLANEOUS) ×3 IMPLANT
STRAP SAFETY BODY (MISCELLANEOUS) ×3 IMPLANT
TRAY KYPHOPAK 15/3 EXPRESS 1ST (MISCELLANEOUS) IMPLANT
TRAY KYPHOPAK 20/3 EXPRESS 1ST (MISCELLANEOUS) ×3 IMPLANT

## 2017-12-20 NOTE — Anesthesia Preprocedure Evaluation (Signed)
Anesthesia Evaluation  Patient identified by MRN, date of birth, ID band Patient awake    Reviewed: Allergy & Precautions, NPO status , Patient's Chart, lab work & pertinent test results  History of Anesthesia Complications Negative for: history of anesthetic complications  Airway Mallampati: II       Dental   Pulmonary neg sleep apnea, neg COPD,           Cardiovascular hypertension (diagnosed in the past, off meds now), (-) Past MI and (-) CHF (-) dysrhythmias (-) Valvular Problems/Murmurs     Neuro/Psych neg Seizures Anxiety CVA (memory difficulties)    GI/Hepatic Neg liver ROS, neg GERD  ,  Endo/Other  neg diabetes  Renal/GU negative Renal ROS     Musculoskeletal   Abdominal   Peds  Hematology   Anesthesia Other Findings   Reproductive/Obstetrics                             Anesthesia Physical Anesthesia Plan  ASA: III  Anesthesia Plan: General   Post-op Pain Management:    Induction: Intravenous  PONV Risk Score and Plan: 3 and Propofol infusion, TIVA and Treatment may vary due to age or medical condition  Airway Management Planned: Nasal Cannula  Additional Equipment:   Intra-op Plan:   Post-operative Plan:   Informed Consent: I have reviewed the patients History and Physical, chart, labs and discussed the procedure including the risks, benefits and alternatives for the proposed anesthesia with the patient or authorized representative who has indicated his/her understanding and acceptance.     Plan Discussed with:   Anesthesia Plan Comments:         Anesthesia Quick Evaluation

## 2017-12-20 NOTE — Anesthesia Post-op Follow-up Note (Signed)
Anesthesia QCDR form completed.        

## 2017-12-20 NOTE — Op Note (Signed)
12/20/2017  12:26 PM  PATIENT:  Gabrielle Crosby  84 y.o. female  PRE-OPERATIVE DIAGNOSIS:  CLOSED WEDGE COMPRESSION FRACTURE OF FIRST LUMBAR VERTEBRA  POST-OPERATIVE DIAGNOSIS:  CLOSED WEDGE COMPRESSION FRACTURE OF FIRST LUMBAR VERTEBRA  PROCEDURE:  Procedure(s): KYPHOPLASTY-L1 (N/A)  SURGEON: Laurene Footman, MD  ASSISTANTS: None  ANESTHESIA:   local and MAC  EBL:  Total I/O In: 500 [I.V.:500] Out: 2 [Blood:2]  BLOOD ADMINISTERED:none  DRAINS: none   LOCAL MEDICATIONS USED:  MARCAINE    and LIDOCAINE   SPECIMEN:  Source of Specimen:  L1 vertebral body  DISPOSITION OF SPECIMEN:  PATHOLOGY  COUNTS:  YES  TOURNIQUET:  * No tourniquets in log *  IMPLANTS: Bone cement  DICTATION: .Dragon Dictation  patient brought the operating room and after adequate anesthesia was given patient was placed prone. C-arm was brought in and good visualization of L1 was obtained. After appropriate period patient identification and timeout procedures 10 cc 1% Xylocaine was infiltrated on the right side near the planned incision. The back was then prepped and draped in sterile fashion and repeat timeout procedure carried out. Spinal he was then used to get down to the pedicle at L1  on the right side and the 30 cc of the 50-50 mix 1% Xylocaine, 0.5 percent Sensorcaine with epinephrine was infiltrated along the path of the planned incision. After allowing this to set for a minute a small incision was made and the trocar was advanced again extrapedicular approach getting into the vertebral body biopsy was obtained. Next drilling was carried out placement of a balloon centrally and inflation to 3 cc. The cement was mixed and once appropriate consistency approximate 3 cc of cement was used to fill the vertebral body with good fill superior to inferior endplates the left and right sides. There was no extravasation. After the cement was set the trochars removed and the wound closed with Dermabond followed by  a Band-Aid    PLAN OF CARE: Discharge to home after PACU  PATIENT DISPOSITION:  PACU - hemodynamically stable.

## 2017-12-20 NOTE — Anesthesia Postprocedure Evaluation (Signed)
Anesthesia Post Note  Patient: Gabrielle Crosby  Procedure(s) Performed: Anne Ng (N/A )  Patient location during evaluation: PACU Anesthesia Type: General Level of consciousness: awake and alert Pain management: pain level controlled Vital Signs Assessment: post-procedure vital signs reviewed and stable Respiratory status: spontaneous breathing and respiratory function stable Cardiovascular status: stable Anesthetic complications: no     Last Vitals:  Vitals:   12/20/17 1011 12/20/17 1220  BP: (!) 181/75 (!) 133/58  Pulse: 79 78  Resp: 18 (!) 23  Temp: (!) 36.1 C 36.5 C  SpO2: 98% 98%    Last Pain:  Vitals:   12/20/17 1011  TempSrc: Tympanic  PainSc: 7     LLE Motor Response: Purposeful movement (12/20/17 1220)   RLE Motor Response: Purposeful movement (12/20/17 1220)        Ashea Winiarski K

## 2017-12-20 NOTE — Discharge Instructions (Addendum)
Take it easy today and tomorrow with resumption of normal activities on Wednesday. Remove Band-Aid Wednesday okay to leave off. Okay to shower starting Wednesday. Call if any problems to the office [phone]  General Anesthesia, Adult, Care After These instructions provide you with information about caring for yourself after your procedure. Your health care provider may also give you more specific instructions. Your treatment has been planned according to current medical practices, but problems sometimes occur. Call your health care provider if you have any problems or questions after your procedure. What can I expect after the procedure? After the procedure, it is common to have:  Vomiting.  A sore throat.  Mental slowness.  It is common to feel:  Nauseous.  Cold or shivery.  Sleepy.  Tired.  Sore or achy, even in parts of your body where you did not have surgery.  Follow these instructions at home: For at least 24 hours after the procedure:  Do not: ? Participate in activities where you could fall or become injured. ? Drive. ? Use heavy machinery. ? Drink alcohol. ? Take sleeping pills or medicines that cause drowsiness. ? Make important decisions or sign legal documents. ? Take care of children on your own.  Rest. Eating and drinking  If you vomit, drink water, juice, or soup when you can drink without vomiting.  Drink enough fluid to keep your urine clear or pale yellow.  Make sure you have little or no nausea before eating solid foods.  Follow the diet recommended by your health care provider. General instructions  Have a responsible adult stay with you until you are awake and alert.  Return to your normal activities as told by your health care provider. Ask your health care provider what activities are safe for you.  Take over-the-counter and prescription medicines only as told by your health care provider.  If you smoke, do not smoke without  supervision.  Keep all follow-up visits as told by your health care provider. This is important. Contact a health care provider if:  You continue to have nausea or vomiting at home, and medicines are not helpful.  You cannot drink fluids or start eating again.  You cannot urinate after 8-12 hours.  You develop a skin rash.  You have fever.  You have increasing redness at the site of your procedure. Get help right away if:  You have difficulty breathing.  You have chest pain.  You have unexpected bleeding.  You feel that you are having a life-threatening or urgent problem. This information is not intended to replace advice given to you by your health care provider. Make sure you discuss any questions you have with your health care provider. Document Released: 02/22/2001 Document Revised: 04/20/2016 Document Reviewed: 10/31/2015 Elsevier Interactive Patient Education  [employer].

## 2017-12-20 NOTE — Anesthesia Procedure Notes (Signed)
Date/Time: 12/20/2017 11:36 AM Performed by: Nelda Marseille, CRNA Pre-anesthesia Checklist: Emergency Drugs available, Patient identified, Suction available, Patient being monitored and Timeout performed Oxygen Delivery Method: Nasal cannula

## 2017-12-20 NOTE — H&P (Signed)
Reviewed paper H+P, will be scanned into chart. No changes noted.  

## 2017-12-20 NOTE — Transfer of Care (Signed)
Immediate Anesthesia Transfer of Care Note  Patient: Gabrielle Crosby  Procedure(s) Performed: Anne Ng (N/A )  Patient Location: PACU  Anesthesia Type:General  Level of Consciousness: awake, alert  and oriented  Airway & Oxygen Therapy: Patient Spontanous Breathing and Patient connected to nasal cannula oxygen  Post-op Assessment: Report given to RN and Post -op Vital signs reviewed and stable  Post vital signs: Reviewed and stable  Last Vitals:  Vitals:   12/20/17 1011  BP: (!) 181/75  Pulse: 79  Resp: 18  Temp: (!) 36.1 C  SpO2: 98%    Last Pain:  Vitals:   12/20/17 1011  TempSrc: Tympanic  PainSc: 7       Patients Stated Pain Goal: 1 (24/93/24 1991)  Complications: No apparent anesthesia complications

## 2017-12-21 LAB — SURGICAL PATHOLOGY

## 2017-12-30 ENCOUNTER — Ambulatory Visit: Payer: Medicare Other

## 2018-01-14 ENCOUNTER — Ambulatory Visit
Admission: RE | Admit: 2018-01-14 | Discharge: 2018-01-14 | Disposition: A | Discharge status: Home / Self Care | Payer: Medicare Other | Source: Ambulatory Visit | Attending: Nurse Practitioner | Admitting: Nurse Practitioner

## 2018-01-14 DIAGNOSIS — I609 Nontraumatic subarachnoid hemorrhage, unspecified: Secondary | ICD-10-CM | POA: Insufficient documentation

## 2018-01-14 DIAGNOSIS — G44091 Other trigeminal autonomic cephalgias (TAC), intractable: Secondary | ICD-10-CM

## 2018-01-14 DIAGNOSIS — S32030A Wedge compression fracture of third lumbar vertebra, initial encounter for closed fracture: Secondary | ICD-10-CM | POA: Insufficient documentation

## 2018-01-14 DIAGNOSIS — I6389 Other cerebral infarction: Secondary | ICD-10-CM | POA: Diagnosis not present

## 2018-01-14 DIAGNOSIS — J322 Chronic ethmoidal sinusitis: Secondary | ICD-10-CM | POA: Insufficient documentation

## 2018-01-14 DIAGNOSIS — X58XXXA Exposure to other specified factors, initial encounter: Secondary | ICD-10-CM | POA: Insufficient documentation

## 2018-04-13 ENCOUNTER — Other Ambulatory Visit: Payer: Self-pay | Admitting: Orthopedic Surgery

## 2018-04-13 DIAGNOSIS — S32040A Wedge compression fracture of fourth lumbar vertebra, initial encounter for closed fracture: Secondary | ICD-10-CM

## 2018-04-15 ENCOUNTER — Ambulatory Visit
Admission: RE | Admit: 2018-04-15 | Discharge: 2018-04-15 | Disposition: A | Discharge status: Home / Self Care | Payer: Medicare Other | Source: Ambulatory Visit | Attending: Orthopedic Surgery | Admitting: Orthopedic Surgery

## 2018-04-15 DIAGNOSIS — M48061 Spinal stenosis, lumbar region without neurogenic claudication: Secondary | ICD-10-CM | POA: Insufficient documentation

## 2018-04-15 DIAGNOSIS — S32049A Unspecified fracture of fourth lumbar vertebra, initial encounter for closed fracture: Secondary | ICD-10-CM | POA: Diagnosis present

## 2018-04-15 DIAGNOSIS — S32040A Wedge compression fracture of fourth lumbar vertebra, initial encounter for closed fracture: Secondary | ICD-10-CM

## 2018-04-15 DIAGNOSIS — S32039A Unspecified fracture of third lumbar vertebra, initial encounter for closed fracture: Secondary | ICD-10-CM | POA: Insufficient documentation

## 2018-04-15 DIAGNOSIS — S32059A Unspecified fracture of fifth lumbar vertebra, initial encounter for closed fracture: Secondary | ICD-10-CM | POA: Diagnosis not present

## 2018-04-15 DIAGNOSIS — X58XXXA Exposure to other specified factors, initial encounter: Secondary | ICD-10-CM | POA: Diagnosis not present

## 2018-04-15 DIAGNOSIS — R937 Abnormal findings on diagnostic imaging of other parts of musculoskeletal system: Secondary | ICD-10-CM | POA: Diagnosis not present

## 2018-04-15 DIAGNOSIS — S32029A Unspecified fracture of second lumbar vertebra, initial encounter for closed fracture: Secondary | ICD-10-CM | POA: Insufficient documentation

## 2018-04-19 ENCOUNTER — Encounter
Admission: RE | Admit: 2018-04-19 | Discharge: 2018-04-19 | Disposition: A | Discharge status: Home / Self Care | Payer: Medicare Other | Source: Ambulatory Visit | Attending: Orthopedic Surgery | Admitting: Orthopedic Surgery

## 2018-04-19 ENCOUNTER — Other Ambulatory Visit: Payer: Self-pay

## 2018-04-19 ENCOUNTER — Inpatient Hospital Stay: Admission: RE | Admit: 2018-04-19 | Payer: Medicare Other | Source: Ambulatory Visit

## 2018-04-19 DIAGNOSIS — Z7982 Long term (current) use of aspirin: Secondary | ICD-10-CM | POA: Diagnosis not present

## 2018-04-19 DIAGNOSIS — E7800 Pure hypercholesterolemia, unspecified: Secondary | ICD-10-CM | POA: Diagnosis not present

## 2018-04-19 DIAGNOSIS — I10 Essential (primary) hypertension: Secondary | ICD-10-CM | POA: Diagnosis not present

## 2018-04-19 DIAGNOSIS — Z8551 Personal history of malignant neoplasm of bladder: Secondary | ICD-10-CM | POA: Diagnosis not present

## 2018-04-19 DIAGNOSIS — Z79899 Other long term (current) drug therapy: Secondary | ICD-10-CM | POA: Diagnosis not present

## 2018-04-19 DIAGNOSIS — Z8673 Personal history of transient ischemic attack (TIA), and cerebral infarction without residual deficits: Secondary | ICD-10-CM | POA: Diagnosis not present

## 2018-04-19 DIAGNOSIS — F419 Anxiety disorder, unspecified: Secondary | ICD-10-CM | POA: Diagnosis not present

## 2018-04-19 DIAGNOSIS — M4856XA Collapsed vertebra, not elsewhere classified, lumbar region, initial encounter for fracture: Secondary | ICD-10-CM | POA: Diagnosis present

## 2018-04-19 DIAGNOSIS — F329 Major depressive disorder, single episode, unspecified: Secondary | ICD-10-CM | POA: Diagnosis not present

## 2018-04-19 HISTORY — DX: Unspecified convulsions: R56.9

## 2018-04-19 HISTORY — DX: Nontraumatic intracerebral hemorrhage, unspecified: I61.9

## 2018-04-19 HISTORY — DX: Gastro-esophageal reflux disease without esophagitis: K21.9

## 2018-04-19 HISTORY — DX: Major depressive disorder, single episode, unspecified: F32.9

## 2018-04-19 HISTORY — DX: Depression, unspecified: F32.A

## 2018-04-19 LAB — BASIC METABOLIC PANEL
ANION GAP: 7 (ref 5–15)
BUN: 18 mg/dL (ref 6–20)
CO2: 26 mmol/L (ref 22–32)
Calcium: 8.9 mg/dL (ref 8.9–10.3)
Chloride: 107 mmol/L (ref 101–111)
Creatinine, Ser: 0.53 mg/dL (ref 0.44–1.00)
GFR calc Af Amer: >60 mL/min (ref >60)
GLUCOSE: 105 mg/dL — AB (ref 65–99)
POTASSIUM: 3.9 mmol/L (ref 3.5–5.1)
Sodium: 140 mmol/L (ref 135–145)

## 2018-04-19 LAB — CBC
HCT: 36.9 % (ref 35.0–47.0)
HEMOGLOBIN: 12.4 g/dL (ref 12.0–16.0)
MCH: 32.1 pg (ref 26.0–34.0)
MCHC: 33.7 g/dL (ref 32.0–36.0)
MCV: 95.1 fL (ref 80.0–100.0)
PLATELETS: 287 10*3/uL (ref 150–440)
RBC: 3.88 MIL/uL (ref 3.80–5.20)
RDW: 13.5 % (ref 11.5–14.5)
WBC: 4.4 10*3/uL (ref 3.6–11.0)

## 2018-04-19 NOTE — Patient Instructions (Signed)
Your procedure is scheduled on: Thursday, Apr 21, 2018  Report to [facility].  DO NOT STOP ON THE FIRST FLOOR TO REGISTER.  To find out your arrival time please call [phone] between 1PM - 3PM on Wednesday, Apr 20, 2018  Remember: Instructions that are not followed completely may result in serious medical risk,  up to and including death, or upon the discretion of your surgeon and anesthesiologist your  surgery may need to be rescheduled.     _X__ 1. Do not eat food after midnight the night before your procedure.                 No gum chewing or hard candies.                   You may drink clear liquids up to 2 hours before you are scheduled to arrive for your surgery-                 DO not drink clear liquids within 2 hours of the start of your surgery.                  Clear Liquids include:  water, apple juice without pulp, clear carbohydrate                 drink such as Clearfast of Gartorade, Black Coffee or Tea (Do not add                 anything to coffee or tea).  __X__2.  On the morning of surgery brush your teeth with toothpaste and water,                        you may rinse your mouth with mouthwash if you wish.                            Do not swallow any toothpaste of mouthwash.     _X__ 3.  No Alcohol for 24 hours before or after surgery.   _X__ 4.  Do Not Smoke or use e-cigarettes For 24 Hours Prior to Your Surgery.                 Do not use any chewable tobacco products for at least 6 hours prior to                 surgery.  ____  5.  Bring all medications with you on the day of surgery if instructed.   ____  6.  Notify your doctor if there is any change in your medical condition      (cold, fever, infections).     Do not wear jewelry, make-up, hairpins, clips or nail polish. Do not wear lotions, powders, or perfumes. You may wear deodorant. Do not shave 48 hours prior to surgery. Men may shave face  and neck. Do not bring valuables to the hospital.    [facility] is not responsible for any belongings or valuables.  Contacts, dentures or bridgework may not be worn into surgery. Leave your suitcase in the car. After surgery it may be brought to your room. For patients admitted to the hospital, discharge time is determined by your treatment team.   Patients discharged the day of surgery will not be allowed to drive home.   Please read over the following fact sheets that  you were given:   PREPARING FOR SURGERY   ____ Take these medicines the morning of surgery with A SIP OF WATER:    1. CELEXA  2. FLONASE  3. NAMENDA  4.  5.  6.  ____ Fleet Enema (as directed)   __X__ Use CHG Soap as directed  _X___ Stop ASPIRIN NOW!!  __X__ Stop Anti-inflammatories NOW!!              THIS INCLUDES RELAFEN       **TYLENOL IS OKAY TO USE**   __X__ Stop supplements until after surgery.                  THIS INCLUDES MAGNESIUM SULFATE  ____ Bring C-Pap to the hospital.    CONTINUE TO TAKE THE MEVACOR AS USUAL.  WEAR LOOSE FITTING CLOTHES TO THE HOSPITAL.

## 2018-04-20 MED ORDER — CEFAZOLIN (ANCEF) 1 G IV SOLR
1.0000 g | INTRAVENOUS | Status: AC
  Administered 2018-04-21: 1 g
  Filled 2018-04-20: qty 1

## 2018-04-21 ENCOUNTER — Encounter: Payer: Self-pay | Admitting: *Deleted

## 2018-04-21 ENCOUNTER — Ambulatory Visit: Payer: Medicare Other

## 2018-04-21 ENCOUNTER — Ambulatory Visit: Payer: Medicare Other | Admitting: Anesthesiology

## 2018-04-21 ENCOUNTER — Ambulatory Visit
Admission: RE | Admit: 2018-04-21 | Discharge: 2018-04-21 | Disposition: A | Discharge status: Home / Self Care | Payer: Medicare Other | Source: Ambulatory Visit | Attending: Orthopedic Surgery | Admitting: Orthopedic Surgery

## 2018-04-21 ENCOUNTER — Other Ambulatory Visit: Payer: Self-pay

## 2018-04-21 ENCOUNTER — Encounter
Admission: RE | Disposition: A | Discharge status: Home / Self Care | Payer: Self-pay | Source: Ambulatory Visit | Attending: Orthopedic Surgery

## 2018-04-21 DIAGNOSIS — Z419 Encounter for procedure for purposes other than remedying health state, unspecified: Secondary | ICD-10-CM

## 2018-04-21 DIAGNOSIS — E7800 Pure hypercholesterolemia, unspecified: Secondary | ICD-10-CM | POA: Insufficient documentation

## 2018-04-21 DIAGNOSIS — M4856XA Collapsed vertebra, not elsewhere classified, lumbar region, initial encounter for fracture: Secondary | ICD-10-CM | POA: Insufficient documentation

## 2018-04-21 DIAGNOSIS — Z8551 Personal history of malignant neoplasm of bladder: Secondary | ICD-10-CM | POA: Insufficient documentation

## 2018-04-21 DIAGNOSIS — Z79899 Other long term (current) drug therapy: Secondary | ICD-10-CM | POA: Insufficient documentation

## 2018-04-21 DIAGNOSIS — F329 Major depressive disorder, single episode, unspecified: Secondary | ICD-10-CM | POA: Insufficient documentation

## 2018-04-21 DIAGNOSIS — Z7982 Long term (current) use of aspirin: Secondary | ICD-10-CM | POA: Insufficient documentation

## 2018-04-21 DIAGNOSIS — Z8673 Personal history of transient ischemic attack (TIA), and cerebral infarction without residual deficits: Secondary | ICD-10-CM | POA: Insufficient documentation

## 2018-04-21 DIAGNOSIS — F419 Anxiety disorder, unspecified: Secondary | ICD-10-CM | POA: Insufficient documentation

## 2018-04-21 DIAGNOSIS — I10 Essential (primary) hypertension: Secondary | ICD-10-CM | POA: Insufficient documentation

## 2018-04-21 HISTORY — PX: KYPHOPLASTY: SHX5884

## 2018-04-21 SURGERY — KYPHOPLASTY
Anesthesia: General

## 2018-04-21 MED ORDER — METOCLOPRAMIDE HCL 5 MG/ML IJ SOLN: 5.0000 mg | Freq: Three times a day (TID) | INTRAMUSCULAR | Status: DC | PRN

## 2018-04-21 MED ORDER — ONDANSETRON HCL 4 MG/2ML IJ SOLN: 4.0000 mg | Freq: Once | INTRAMUSCULAR | Status: DC | PRN

## 2018-04-21 MED ORDER — LIDOCAINE HCL 1 % IJ SOLN
INTRAMUSCULAR | Status: DC | PRN
  Administered 2018-04-21: 46 mL

## 2018-04-21 MED ORDER — ONDANSETRON HCL 4 MG/2ML IJ SOLN: 4.0000 mg | Freq: Four times a day (QID) | INTRAMUSCULAR | Status: DC | PRN

## 2018-04-21 MED ORDER — MIDAZOLAM HCL 2 MG/2ML IJ SOLN
INTRAMUSCULAR | Status: AC
  Filled 2018-04-21: qty 2

## 2018-04-21 MED ORDER — HYDROCODONE-ACETAMINOPHEN 5-325 MG PO TABS: 1.0000 | ORAL_TABLET | ORAL | Status: DC | PRN

## 2018-04-21 MED ORDER — LACTATED RINGERS IV SOLN
INTRAVENOUS | Status: DC
  Administered 2018-04-21: 10:00:00 via INTRAVENOUS

## 2018-04-21 MED ORDER — FAMOTIDINE 20 MG PO TABS
ORAL_TABLET | ORAL | Status: AC
  Filled 2018-04-21: qty 1

## 2018-04-21 MED ORDER — IOPAMIDOL (ISOVUE-M 200) INJECTION 41%
INTRAMUSCULAR | Status: DC | PRN
  Administered 2018-04-21: 80 mL

## 2018-04-21 MED ORDER — KETAMINE HCL 50 MG/ML IJ SOLN
INTRAMUSCULAR | Status: AC
Start: 2018-04-21 — End: ?
  Filled 2018-04-21: qty 10

## 2018-04-21 MED ORDER — PROPOFOL 500 MG/50ML IV EMUL
INTRAVENOUS | Status: DC | PRN
  Administered 2018-04-21: 30 ug/kg/min via INTRAVENOUS

## 2018-04-21 MED ORDER — ONDANSETRON HCL 4 MG PO TABS: 4.0000 mg | ORAL_TABLET | Freq: Four times a day (QID) | ORAL | Status: DC | PRN

## 2018-04-21 MED ORDER — CEFAZOLIN SODIUM-DEXTROSE 1-4 GM/50ML-% IV SOLN
INTRAVENOUS | Status: AC
  Filled 2018-04-21: qty 50

## 2018-04-21 MED ORDER — KETAMINE HCL 50 MG/ML IJ SOLN
INTRAMUSCULAR | Status: DC | PRN
  Administered 2018-04-21: 25 mg via INTRAMUSCULAR
  Administered 2018-04-21: 10 mg via INTRAMUSCULAR
  Administered 2018-04-21: 5 mg via INTRAMUSCULAR
  Administered 2018-04-21: 25 mg via INTRAMUSCULAR
  Administered 2018-04-21: 10 mg via INTRAMUSCULAR

## 2018-04-21 MED ORDER — METOCLOPRAMIDE HCL 10 MG PO TABS: 5.0000 mg | ORAL_TABLET | Freq: Three times a day (TID) | ORAL | Status: DC | PRN

## 2018-04-21 MED ORDER — HYDROCODONE-ACETAMINOPHEN 5-325 MG PO TABS: 1.0000 | ORAL_TABLET | Freq: Four times a day (QID) | ORAL | 0 refills | Status: DC | PRN

## 2018-04-21 MED ORDER — SODIUM CHLORIDE 0.9 % IV SOLN: INTRAVENOUS | Status: DC

## 2018-04-21 MED ORDER — MIDAZOLAM HCL 2 MG/2ML IJ SOLN
INTRAMUSCULAR | Status: DC | PRN
  Administered 2018-04-21: 1 mg via INTRAVENOUS

## 2018-04-21 MED ORDER — FENTANYL CITRATE (PF) 100 MCG/2ML IJ SOLN: 25.0000 ug | INTRAMUSCULAR | Status: DC | PRN

## 2018-04-21 MED ORDER — BUPIVACAINE-EPINEPHRINE (PF) 0.5% -1:200000 IJ SOLN
INTRAMUSCULAR | Status: DC | PRN
  Administered 2018-04-21: 30 mL via PERINEURAL

## 2018-04-21 MED ORDER — PROPOFOL 500 MG/50ML IV EMUL
INTRAVENOUS | Status: AC
  Filled 2018-04-21: qty 50

## 2018-04-21 MED ORDER — FAMOTIDINE 20 MG PO TABS
20.0000 mg | ORAL_TABLET | Freq: Once | ORAL | Status: AC
  Administered 2018-04-21: 20 mg via ORAL

## 2018-04-21 SURGICAL SUPPLY — 16 items
CEMENT KYPHON CX01A KIT/MIXER (Cement) ×6 IMPLANT
DERMABOND ADVANCED (GAUZE/BANDAGES/DRESSINGS) ×2
DERMABOND ADVANCED .7 DNX12 (GAUZE/BANDAGES/DRESSINGS) ×1 IMPLANT
DEVICE BIOPSY BONE KYPHX (INSTRUMENTS) ×3 IMPLANT
DRAPE C-ARM XRAY 36X54 (DRAPES) ×3 IMPLANT
DURAPREP 26ML APPLICATOR (WOUND CARE) ×3 IMPLANT
GLOVE SURG SYN 9.0  PF PI (GLOVE) ×2
GLOVE SURG SYN 9.0 PF PI (GLOVE) ×1 IMPLANT
GOWN SRG 2XL LVL 4 RGLN SLV (GOWNS) ×1 IMPLANT
GOWN STRL NON-REIN 2XL LVL4 (GOWNS) ×2
GOWN STRL REUS W/ TWL LRG LVL3 (GOWN DISPOSABLE) ×1 IMPLANT
GOWN STRL REUS W/TWL LRG LVL3 (GOWN DISPOSABLE) ×2
PACK KYPHOPLASTY (MISCELLANEOUS) ×3 IMPLANT
STRAP SAFETY 5IN WIDE (MISCELLANEOUS) ×3 IMPLANT
TRAY KYPHOPAK 15/3 EXPRESS 1ST (MISCELLANEOUS) ×3 IMPLANT
TRAY KYPHOPAK 20/3 EXPRESS 1ST (MISCELLANEOUS) ×3 IMPLANT

## 2018-04-21 NOTE — Anesthesia Preprocedure Evaluation (Signed)
Anesthesia Evaluation  Patient identified by MRN, date of birth, ID band Patient awake    Reviewed: Allergy & Precautions, NPO status , Patient's Chart, lab work & pertinent test results  History of Anesthesia Complications Negative for: history of anesthetic complications  Airway Mallampati: II       Dental  (+) Partial Upper, Partial Lower   Pulmonary neg sleep apnea, neg COPD,           Cardiovascular hypertension (no meds at the current time), (-) Past MI and (-) CHF (-) dysrhythmias (-) Valvular Problems/Murmurs     Neuro/Psych neg Seizures Anxiety Depression Dementia CVA (mild dementia and memory loss), Residual Symptoms    GI/Hepatic Neg liver ROS, neg GERD  ,  Endo/Other  neg diabetes  Renal/GU negative Renal ROS     Musculoskeletal   Abdominal   Peds  Hematology   Anesthesia Other Findings   Reproductive/Obstetrics                             Anesthesia Physical Anesthesia Plan  ASA: III  Anesthesia Plan: General   Post-op Pain Management:    Induction: Intravenous  PONV Risk Score and Plan: 3 and Ondansetron, Dexamethasone and Treatment may vary due to age or medical condition  Airway Management Planned: Simple Face Mask  Additional Equipment:   Intra-op Plan:   Post-operative Plan:   Informed Consent: I have reviewed the patients History and Physical, chart, labs and discussed the procedure including the risks, benefits and alternatives for the proposed anesthesia with the patient or authorized representative who has indicated his/her understanding and acceptance.     Plan Discussed with:   Anesthesia Plan Comments:         Anesthesia Quick Evaluation

## 2018-04-21 NOTE — Anesthesia Postprocedure Evaluation (Signed)
Anesthesia Post Note  Patient: Gabrielle Crosby  Procedure(s) Performed: KYPHOPLASTY-L2,L3,L4,L5 (N/A )  Patient location during evaluation: PACU Anesthesia Type: General Level of consciousness: awake and alert Pain management: pain level controlled Vital Signs Assessment: post-procedure vital signs reviewed and stable Respiratory status: spontaneous breathing and respiratory function stable Cardiovascular status: stable Anesthetic complications: no     Last Vitals:  Vitals:   04/21/18 1215 04/21/18 1220  BP:  (!) 151/60  Pulse: 71 70  Resp: 13 14  Temp:    SpO2: 100% 100%    Last Pain:  Vitals:   04/21/18 1210  TempSrc:   PainSc: 0-No pain                 KEPHART,WILLIAM K

## 2018-04-21 NOTE — Anesthesia Procedure Notes (Signed)
Date/Time: 04/21/2018 11:14 AM Performed by: Nelda Marseille, CRNA Pre-anesthesia Checklist: Patient identified, Emergency Drugs available, Suction available, Patient being monitored and Timeout performed Oxygen Delivery Method: Nasal cannula

## 2018-04-21 NOTE — H&P (Signed)
Reviewed paper H+P, will be scanned into chart. No changes noted.  

## 2018-04-21 NOTE — Op Note (Signed)
04/21/2018  12:09 PM  PATIENT:  Gabrielle Crosby  84 y.o. female  PRE-OPERATIVE DIAGNOSIS:  CLOSED WEDGE COMPRESSION FRACTURE L2,3,4,5  POST-OPERATIVE DIAGNOSIS:  CLOSED WEDGE COMPRESSION FRACTURE same  PROCEDURE:  Procedure(s): KYPHOPLASTY-L2,L3,L4,L5 (N/A)  SURGEON: Laurene Footman, MD  ASSISTANTS: none  ANESTHESIA:   local and MAC  EBL:  Total I/O In: 500 [I.V.:500] Out: -   BLOOD ADMINISTERED:none  DRAINS: none   LOCAL MEDICATIONS USED:  MARCAINE    and XYLOCAINE   SPECIMEN:  Source of Specimen:  vertebral bodies L 2,3,4,5  DISPOSITION OF SPECIMEN:  PATHOLOGY  COUNTS:  YES  TOURNIQUET:  * No tourniquets in log *  IMPLANTS: bone cement  DICTATION: .Dragon Dictation patient was brought to the operating room and after adequate sedation was given the patient was placed prone.  After bringing serum in good visualization of all levels appropriate patient education and timeout procedure completed with 10 cc of 1% Xylocaine infiltrated subcutaneously and there is a planned incisions.  The back was then prepped and draped you sterile manner and repeat timeout procedure carried out.  Spinal needle was then used to get local anesthetic a 50-50 mix of 1% Xylocaine half percent Sensorcaine with epinephrine down to the pedicle on the right at L2 and on the left at L3 trochars were then advanced into the body with biopsies obtained balloon inflation and then cementation after the cement was adequate consistency.  There is good fill in both levels and this was left in place as the L4 and 5 levels were approached in identical fashion with local anesthetic on the right at L4 left at L5 with a extrapedicular approach is that each level.  Biopsies were obtained at L4 and L5 again placing the trocar getting a biopsy drilling placing a balloon inflating the balloon each level to about 2 to 3 cc and and and placing 3 to 4 cc of cement at each level there did not appear to be extravasation and  there was good fill at each level.  After the cement was set all trochars removed and the wound closed with Dermabond followed by Band-Aids  PLAN OF CARE: Discharge to home after PACU  PATIENT DISPOSITION:  PACU - hemodynamically stable.

## 2018-04-21 NOTE — Anesthesia Post-op Follow-up Note (Signed)
Anesthesia QCDR form completed.        

## 2018-04-21 NOTE — Transfer of Care (Signed)
Immediate Anesthesia Transfer of Care Note  Patient: Gabrielle Crosby  Procedure(s) Performed: KYPHOPLASTY-L2,L3,L4,L5 (N/A )  Patient Location: PACU  Anesthesia Type:General  Level of Consciousness: sedated  Airway & Oxygen Therapy: Patient Spontanous Breathing and Patient connected to nasal cannula oxygen  Post-op Assessment: Report given to RN and Post -op Vital signs reviewed and stable  Post vital signs: Reviewed and stable  Last Vitals:  Vitals Value Taken Time  BP    Temp    Pulse    Resp    SpO2      Last Pain:  Vitals:   04/21/18 0952  TempSrc: Tympanic  PainSc: 0-No pain         Complications: No apparent anesthesia complications

## 2018-04-21 NOTE — Discharge Instructions (Addendum)
Take it easy today and tomorrow and resume more normal activities on Saturday.  Remove Band-Aids on Saturday then okay to shower but not soak in a tub for 1 week  AMBULATORY SURGERY  DISCHARGE INSTRUCTIONS   1) The drugs that you were given will stay in your system until tomorrow so for the next 24 hours you should not:  A) Drive an automobile B) Make any legal decisions C) Drink any alcoholic beverage   2) You may resume regular meals tomorrow.  Today it is better to start with liquids and gradually work up to solid foods.  You may eat anything you prefer, but it is better to start with liquids, then soup and crackers, and gradually work up to solid foods.   3) Please notify your doctor immediately if you have any unusual bleeding, trouble breathing, redness and pain at the surgery site, drainage, fever, or pain not relieved by medication.    4) Additional Instructions:        Please contact your physician with any problems or Same Day Surgery at [phone], Monday through Friday 6 am to 4 pm, or [place] at [facility] number at [phone].

## 2018-04-22 ENCOUNTER — Ambulatory Visit: Payer: Medicare Other

## 2018-04-22 LAB — SURGICAL PATHOLOGY

## 2018-06-10 ENCOUNTER — Ambulatory Visit: Payer: Medicare Other | Admitting: Urology

## 2018-07-14 ENCOUNTER — Encounter: Payer: Self-pay | Admitting: Urology

## 2018-07-14 ENCOUNTER — Ambulatory Visit (INDEPENDENT_AMBULATORY_CARE_PROVIDER_SITE_OTHER): Payer: Medicare Other | Admitting: Urology

## 2018-07-14 VITALS — BP 184/74 | HR 80 | Wt 105.0 lb

## 2018-07-14 DIAGNOSIS — C679 Malignant neoplasm of bladder, unspecified: Secondary | ICD-10-CM | POA: Diagnosis not present

## 2018-07-14 LAB — URINALYSIS, COMPLETE
Bilirubin, UA: NEGATIVE
Glucose, UA: NEGATIVE
KETONES UA: NEGATIVE
LEUKOCYTES UA: NEGATIVE
Nitrite, UA: NEGATIVE
PROTEIN UA: NEGATIVE
RBC, UA: NEGATIVE
SPEC GRAV UA: 1.02 (ref 1.005–1.030)
Urobilinogen, Ur: 0.2 mg/dL (ref 0.2–1.0)
pH, UA: 7 (ref 5.0–7.5)

## 2018-07-14 LAB — MICROSCOPIC EXAMINATION
EPITHELIAL CELLS (NON RENAL): NONE SEEN /HPF (ref 0–10)
WBC, UA: NONE SEEN /hpf (ref 0–5)

## 2018-07-14 MED ORDER — CIPROFLOXACIN HCL 500 MG PO TABS
500.0000 mg | ORAL_TABLET | Freq: Once | ORAL | Status: AC
  Administered 2018-07-14: 500 mg via ORAL

## 2018-07-14 MED ORDER — LIDOCAINE HCL URETHRAL/MUCOSAL 2 % EX GEL
1.0000 "application " | Freq: Once | CUTANEOUS | Status: AC
  Administered 2018-07-14: 1 via URETHRAL

## 2018-07-14 NOTE — Progress Notes (Signed)
11:38 AM  07/14/18   Gabrielle Crosby Apr 19, 1933 [id number]  Referring provider: Sofie Hartigan, MD [address] [place], [place] [address]  Chief Complaint  Patient presents with  . Cysto    HPI: 84 yo F with history of bladder cancer first dx on 12/2013. 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 She was counseled to undergo BCG, but however, due to national back order, she underwent mitomycin x 3.  Once available, she completed BCG x 6 from 11/23-12/28/15.   She returned to the OR on 04/23/2015 after some nonspecific patchy erythema was identified on surveillance cystoscopy in the setting of atypical urine cytology. Biopsy results were negative for any malignancy (BCG granuloma).  Most recent upper tract imaging in the form of renal ultrasound in 11/2016. She had about 1.9 cm right simple renal cyst otherwise her kidneys were unremarkable. There is a questionable small focal lesion of the right posterior bladder wall which was not seen cystoscopically.  She has no urinary complaints today.  Her dementia continues to progress.  PMH: Past Medical History:  Diagnosis Date  . Arthritis    hands  . Bladder cancer ([place])   . Brain bleed ([place]) 2019   d/t light strokes, high bp, seizures. brain has shrunk per dr. Manuella Ghazi and mri  . Cancer ([place])    BLADDER X 2  . Dementia 2019   dementia progressing  . Depression   . GERD (gastroesophageal reflux disease)    chokes easily even on own saliva and then has panic attack  . Hyperlipidemia   . Hypertension 2019   no medications.  no htn per daughter  . Seizures [facility])    family not aware of any seizures and no treatment  . Stroke [facility]) 2019   undiagnosed but per neuro, she has had them causing vascular dementia  . Wears dentures    partial upper and lower    Surgical History: Past  Surgical History:  Procedure Laterality Date  . ABDOMINAL HYSTERECTOMY Bilateral    BILATERAL s&o  . BACK SURGERY    . BLADDER SURGERY N/A 2015   REMOVAL OF BLADDER CANCER X 2  . BOWEL RESECTION N/A 1999   SMALL BOWEL OBSTRUCTION  . CHOLECYSTECTOMY    . COLONOSCOPY N/A 11/06/2015   Procedure: COLONOSCOPY;  Surgeon: Hulen Luster, MD;  Location: [place];  Service: Gastroenterology;  Laterality: N/A;  . CYSTOSCOPY WITH BIOPSY N/A 04/17/2015   Procedure: CYSTOSCOPY WITH BIOPSY;  Surgeon: Hollice Espy, MD;  Location: [facility];  Service: Urology;  Laterality: N/A;  . KYPHOPLASTY N/A 12/20/2017   Procedure: [id number];  Surgeon: Hessie Knows, MD;  Location: [facility];  Service: Orthopedics;  Laterality: N/A;  . KYPHOPLASTY N/A 04/21/2018   Procedure: KYPHOPLASTY-L2,L3,L4,L5;  Surgeon: Hessie Knows, MD;  Location: [facility];  Service: Orthopedics;  Laterality: N/A;    Home Medications:  Allergies as of 07/14/2018      Reactions   Atenolol Other (See Comments)   Bradycardia    Donepezil Hcl Other (See Comments)   Mental changes, "made me crazy"      Medication List        Accurate as of 07/14/18 11:38 AM. Always use your most recent med list.          acetaminophen 500 MG tablet Commonly known as:  TYLENOL Take 1,000 mg by  mouth every 6 (six) hours as needed for mild pain or moderate pain.   aspirin EC 81 MG tablet Take 81 mg by mouth daily.   citalopram 20 MG tablet Commonly known as:  CELEXA Take 20 mg by mouth daily.   lovastatin 20 MG tablet Commonly known as:  MEVACOR TAKE 1 TABLET (20 MG TOTAL) BY MOUTH DAILY WITH DINNER.   magnesium oxide 400 MG tablet Commonly known as:  MAG-OX Take 400 mg by mouth daily.   memantine 10 MG tablet Commonly known as:  NAMENDA Take 10 mg by mouth 2 (two) times daily.   nabumetone 500 MG tablet Commonly known as:  RELAFEN Take 500 mg by mouth 2 (two) times daily.       Allergies:  Allergies  Allergen Reactions    . Atenolol Other (See Comments)    Bradycardia   . Donepezil Hcl Other (See Comments)    Mental changes, "made me crazy"    Family History: Family History  Family history unknown: Yes    Social History:  reports that she has never smoked. She has never used smokeless tobacco. She reports that she does not drink alcohol or use drugs.   Physical Exam: BP (!) 184/74   Pulse 80   Wt 105 lb (47.6 kg)   BMI 19.84 kg/m   Constitutional:  Alert and oriented, No acute distress. HEENT: [place] AT, moist mucus membranes.  Trachea midline, no masses. Cardiovascular: No clubbing, cyanosis, or edema. Respiratory: Normal respiratory effort, no increased work of breathing. GI: Abdomen is soft, nontender, nondistended, no abdominal masses GU: No CVA tenderness. Normal external female genitalia, normal urethral meatus. Skin: No rashes, bruises or suspicious lesions. Neurologic: Grossly intact, no focal deficits, moving all 4 extremities.  Severe memory impairment noted. Psychiatric: Normal mood and affect.  Laboratory Data: Lab Results  Component Value Date   WBC 4.4 04/19/2018   HGB 12.4 04/19/2018   HCT 36.9 04/19/2018   MCV 95.1 04/19/2018   PLT 287 04/19/2018    Lab Results  Component Value Date   CREATININE 0.53 04/19/2018    Urinalysis UA reviewed, see epic  Cystoscopy Procedure Note  Patient identification was confirmed, informed consent was obtained, and patient was prepped using Betadine solution.  Lidocaine jelly was administered per urethral meatus.    Preoperative abx where received prior to procedure.    Procedure: - Flexible cystoscope introduced, without any difficulty.   - Thorough search of the bladder revealed:    normal urethral meatus    normal urothelium, stellate well-healed scar on posterior bladder wall    no stones    no ulcers     no tumors    no urethral polyps    no trabeculation  - Ureteral orifices were normal in position and  appearance.  Post-Procedure: - Patient tolerated the procedure well   Assessment & Plan:    1. 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  Most recent bx 03/2015 c/w BCG granuloma, no recurrence.   Cysto today fairly unremarkable. We will continue q 24-month cystoscopy for total 5 years and then reassess - Urinalysis, Complete - lidocaine (XYLOCAINE) 2 % jelly 1 application; Place 1 application into the urethra once. - ciprofloxacin (CIPRO) tablet 500 mg; Take 1 tablet (500 mg total) by mouth once.   Return in about 1 year (around 07/15/2019) for cysto.  Hollice Espy,  MD  [facility] [address] [place], [place] [address] [phone]

## 2018-10-01 ENCOUNTER — Emergency Department: Payer: Medicare Other

## 2018-10-01 ENCOUNTER — Other Ambulatory Visit: Payer: Self-pay

## 2018-10-01 ENCOUNTER — Emergency Department
Admission: EM | Admit: 2018-10-01 | Discharge: 2018-10-01 | Disposition: A | Discharge status: Home / Self Care | Payer: Medicare Other | Attending: Emergency Medicine | Admitting: Emergency Medicine

## 2018-10-01 DIAGNOSIS — Z79899 Other long term (current) drug therapy: Secondary | ICD-10-CM | POA: Diagnosis not present

## 2018-10-01 DIAGNOSIS — S60211A Contusion of right wrist, initial encounter: Secondary | ICD-10-CM | POA: Diagnosis not present

## 2018-10-01 DIAGNOSIS — E785 Hyperlipidemia, unspecified: Secondary | ICD-10-CM | POA: Insufficient documentation

## 2018-10-01 DIAGNOSIS — Y92003 Bedroom of unspecified non-institutional (private) residence as the place of occurrence of the external cause: Secondary | ICD-10-CM | POA: Diagnosis not present

## 2018-10-01 DIAGNOSIS — S0990XA Unspecified injury of head, initial encounter: Secondary | ICD-10-CM | POA: Diagnosis present

## 2018-10-01 DIAGNOSIS — Z7982 Long term (current) use of aspirin: Secondary | ICD-10-CM | POA: Insufficient documentation

## 2018-10-01 DIAGNOSIS — W06XXXA Fall from bed, initial encounter: Secondary | ICD-10-CM | POA: Diagnosis not present

## 2018-10-01 DIAGNOSIS — Y998 Other external cause status: Secondary | ICD-10-CM | POA: Insufficient documentation

## 2018-10-01 DIAGNOSIS — Y939 Activity, unspecified: Secondary | ICD-10-CM | POA: Diagnosis not present

## 2018-10-01 DIAGNOSIS — S3282XA Multiple fractures of pelvis without disruption of pelvic ring, initial encounter for closed fracture: Secondary | ICD-10-CM | POA: Diagnosis not present

## 2018-10-01 DIAGNOSIS — I10 Essential (primary) hypertension: Secondary | ICD-10-CM | POA: Insufficient documentation

## 2018-10-01 DIAGNOSIS — Z8673 Personal history of transient ischemic attack (TIA), and cerebral infarction without residual deficits: Secondary | ICD-10-CM | POA: Diagnosis not present

## 2018-10-01 DIAGNOSIS — F0390 Unspecified dementia without behavioral disturbance: Secondary | ICD-10-CM | POA: Insufficient documentation

## 2018-10-01 DIAGNOSIS — M25551 Pain in right hip: Secondary | ICD-10-CM | POA: Diagnosis not present

## 2018-10-01 MED ORDER — ACETAMINOPHEN 500 MG PO TABS
1000.0000 mg | ORAL_TABLET | Freq: Once | ORAL | Status: AC
  Administered 2018-10-01: 1000 mg via ORAL
  Filled 2018-10-01: qty 2

## 2018-10-01 MED ORDER — OXYCODONE HCL 5 MG PO TABS
5.0000 mg | ORAL_TABLET | Freq: Once | ORAL | Status: AC
  Administered 2018-10-01: 5 mg via ORAL
  Filled 2018-10-01: qty 1

## 2018-10-01 MED ORDER — OXYCODONE HCL 5 MG PO TABS: 5.0000 mg | ORAL_TABLET | Freq: Three times a day (TID) | ORAL | 0 refills | Status: DC | PRN

## 2018-10-01 NOTE — ED Triage Notes (Addendum)
Lives with family. States slipped out of bed 2 nights ago. States hit L arm on door last night. Bruising noted to L arm. Hit R wrist on door as well. Family more concerned about R hip and R wrist. Doesn't use cane/walker. States hx alzheimers. Family states has been getting up and wandering and trying to get out of house last night. Family states that pt was c/o R hip pain that goes around to groin.   Pt alert, cooperative. Confused in triage.

## 2018-10-01 NOTE — ED Triage Notes (Signed)
First Nurse Note:  ARrives with family, patient has known history of Alzheimer's and Ischemic Vascular Dementia.  Family describes worsening confusion at night this week, but also states patient fell out of bed two days ago and has ha small area of redness to left forehead.  States that patient was able to get out of bed this morning and walk into family room, but since that time, patient c/o right hip pain and has been unable to ambulate.

## 2018-10-01 NOTE — ED Provider Notes (Signed)
----------------------------------------- 4:20 PM on 10/01/2018 -----------------------------------------   Blood pressure (!) 162/80, pulse 84, temperature 98.1 F (36.7 C), temperature source Oral, resp. rate 16, height 5\' 2"  (1.575 m), weight 48.1 kg, SpO2 98 %.  Assuming care from Dr. Clearnce Hasten of TRAYONNA BACHMEIER is a 84 y.o. female with a chief complaint of Fall .    Please refer to H&P by previous MD for further details.  The current plan of care is to f/u CT.   _________________________ 4:20 PM on 10/01/2018 -----------------------------------------  I have personally reviewed the images performed during this visit and I agree with the Radiologist's read.   Interpretation by Radiologist:  Dg Wrist Complete Right  Result Date: 10/01/2018 CLINICAL DATA:  Patient reports fall onto right side 2 days ago. Reports pain to right hip and difficulty bearing weight since fall. Denies any previous injury to right hip. Reports pain to dorsal surface of right wrist subsequent to fall. Repor.*comment was truncated*fall on R wrist yesterday against door EXAM: RIGHT WRIST - COMPLETE 3+ VIEW COMPARISON:  None. FINDINGS: No distal radius or ulnar fracture. Radiocarpal joint is intact. No carpal fracture. No soft tissue abnormality. IMPRESSION: No fracture or dislocation. Electronically Signed   By: Suzy Bouchard M.D.   On: 10/01/2018 14:25   Ct Hip Right Wo Contrast  Result Date: 10/01/2018 CLINICAL DATA:  Right hip pain. EXAM: CT OF THE RIGHT HIP WITHOUT CONTRAST TECHNIQUE: Multidetector CT imaging of the right hip was performed according to the standard protocol. Multiplanar CT image reconstructions were also generated. COMPARISON:  None. FINDINGS: Bones/Joint/Cartilage Generalized osteopenia. No right femur or acetabular fracture. Nondisplaced fracture of the right inferior pubic ramus. Possible nondisplaced fracture of the right superior pubic ramus. Normal alignment. No joint effusion.  Moderate right hip joint space narrowing with marginal osteophytes consistent with osteoarthritis. Ligaments Ligaments are suboptimally evaluated by CT. Muscles and Tendons Soft tissue No fluid collection or hematoma.  No soft tissue mass. IMPRESSION: 1. Nondisplaced fracture of the right inferior pubic ramus. Possible nondisplaced fracture of the right superior pubic ramus. 2. No right hip fracture or dislocation. Electronically Signed   By: Kathreen Devoid   On: 10/01/2018 15:36   Dg Hip Unilat  With Pelvis 2-3 Views Right  Result Date: 10/01/2018 CLINICAL DATA:  Patient reports fall onto right side 2 days ago. Reports pain to right hip and difficulty bearing weight since fall. Denies any previous injury to right hip. Reports pain to dorsal surface of right wrist subsequent to fall. Repor.*comment was truncated*fall two days ago, wasn't bearing weight on R hip today. walking prior to today. EXAM: DG HIP (WITH OR WITHOUT PELVIS) 2-3V RIGHT COMPARISON:  None. FINDINGS: Hips are located. No evidence of pelvic fracture or sacral fracture. Dedicated view of the RIGHT hip demonstrates no femoral neck fracture. IMPRESSION: No evidence of pelvic fracture or RIGHT hip fracture. If there is high clinical suspicion for occult hip fracture or the patient refuses to weightbear, consider further evaluation with MRI or CT. Although CT is expeditious, evidence is lacking regarding accuracy of CT over plain film radiography. Electronically Signed   By: Suzy Bouchard M.D.   On: 10/01/2018 14:19     CT showing pelvic fractures but no hip fracture. Will give PO meds for pain control and attempt to ambulate  _________________________ 5:46 PM on 10/01/2018 -----------------------------------------  Patient able to ambulate with a walker after pain control.  Discussed weightbearing as tolerated, pain control, close follow-up with orthopedics.  Patient lives at  home with her daughter who is comfortable taking her home.  She  does have a walker.  Discussed standard return precautions peer      Alfred Levins, [place], MD 10/01/18 1747

## 2018-10-01 NOTE — ED Provider Notes (Signed)
[facility] Emergency Department Provider Note  ____________________________________________   First MD Initiated Contact with Patient 10/01/18 1357     (approximate)  I have reviewed the triage vital signs and the nursing notes.   HISTORY  Chief Complaint Fall   HPI Tomesha Sargent Matsuoka is a 84 y.o. female with a history of Alzheimer's as well as vascular dementia was presented to the emergency department today with right-sided hip pain.  She is accompanied by her daughter as well as son-in-law who states that the patient over the past several months has had worsening of her dementia and now roams the house.  They said that she sundown's and 2 nights rolled off of her bed, sustaining a rug burn on the left side of her face after falling out of the bed.  Patient denying any headache or neck pain at this time.  No focal neurologic deficits such as weakness or numbness.  Patient then also had a collision while walking yesterday with a door and hit her right wrist as well as right hip.  Since then the patient has had difficulty with ambulation but is able to bear weight.  Past Medical History:  Diagnosis Date  . Arthritis    hands  . Bladder cancer ([place])   . Brain bleed ([place]) 2019   d/t light strokes, high bp, seizures. brain has shrunk per dr. Manuella Ghazi and mri  . Cancer ([place])    BLADDER X 2  . Dementia ([place]) 2019   dementia progressing  . Depression   . GERD (gastroesophageal reflux disease)    chokes easily even on own saliva and then has panic attack  . Hyperlipidemia   . Hypertension 2019   no medications.  no htn per daughter  . Seizures [facility])    family not aware of any seizures and no treatment  . Stroke [facility]) 2019   undiagnosed but per neuro, she has had them causing vascular dementia  . Wears dentures    partial upper and lower    Patient Active Problem List   Diagnosis Date Noted  . Loss of memory 07/26/2016  . Anxiety 06/26/2016  . Bradycardia  01/02/2015  . H/O deep venous thrombosis 01/02/2015  . MI (mitral incompetence) 01/02/2015  . Near syncope 01/02/2015  . Malignant neoplasm of urinary bladder ([place]) 08/22/2014  . Mixed Alzheimer's and vascular dementia ([place]) 08/22/2014  . Essential (primary) hypertension 08/22/2014  . Adaptive colitis 08/22/2014  . Lacunar infarction ([place]) 08/22/2014  . Combined fat and carbohydrate induced hyperlipemia 08/22/2014  . Allergic rhinitis, seasonal 08/22/2014    Past Surgical History:  Procedure Laterality Date  . ABDOMINAL HYSTERECTOMY Bilateral    BILATERAL s&o  . BACK SURGERY    . BLADDER SURGERY N/A 2015   REMOVAL OF BLADDER CANCER X 2  . BOWEL RESECTION N/A 1999   SMALL BOWEL OBSTRUCTION  . CHOLECYSTECTOMY    . COLONOSCOPY N/A 11/06/2015   Procedure: COLONOSCOPY;  Surgeon: Hulen Luster, MD;  Location: [place];  Service: Gastroenterology;  Laterality: N/A;  . CYSTOSCOPY WITH BIOPSY N/A 04/17/2015   Procedure: CYSTOSCOPY WITH BIOPSY;  Surgeon: Hollice Espy, MD;  Location: [facility];  Service: Urology;  Laterality: N/A;  . KYPHOPLASTY N/A 12/20/2017   Procedure: [id number];  Surgeon: Hessie Knows, MD;  Location: [facility];  Service: Orthopedics;  Laterality: N/A;  . KYPHOPLASTY N/A 04/21/2018   Procedure: KYPHOPLASTY-L2,L3,L4,L5;  Surgeon: Hessie Knows, MD;  Location: [facility];  Service: Orthopedics;  Laterality: N/A;  Prior to Admission medications   Medication Sig Start Date End Date Taking? Authorizing Provider  acetaminophen (TYLENOL) 500 MG tablet Take 1,000 mg by mouth every 6 (six) hours as needed for mild pain or moderate pain.    [provider]  aspirin EC 81 MG tablet Take 81 mg by mouth daily.    [provider]  citalopram (CELEXA) 20 MG tablet Take 20 mg by mouth daily.     [provider]  lovastatin (MEVACOR) 20 MG tablet TAKE 1 TABLET (20 MG TOTAL) BY MOUTH DAILY WITH DINNER. 05/27/15   [provider]  magnesium  oxide (MAG-OX) 400 MG tablet Take 400 mg by mouth daily.    [provider]  memantine (NAMENDA) 10 MG tablet Take 10 mg by mouth 2 (two) times daily.  11/05/16   [provider]  nabumetone (RELAFEN) 500 MG tablet Take 500 mg by mouth 2 (two) times daily.    [provider]    Allergies Atenolol and Donepezil hcl  Family History  Family history unknown: Yes    Social History Social History   Tobacco Use  . Smoking status: Never Smoker  . Smokeless tobacco: Never Used  Substance Use Topics  . Alcohol use: Never    Alcohol/week: 0.0 standard drinks    Frequency: Never  . Drug use: No    Review of Systems  Constitutional: No fever/chills Eyes: No visual changes. ENT: No sore throat. Cardiovascular: Denies chest pain. Respiratory: Denies shortness of breath. Gastrointestinal: No abdominal pain.  No nausea, no vomiting.  No diarrhea.  No constipation. Genitourinary: Negative for dysuria. Musculoskeletal: Negative for back pain. Skin: Negative for rash. Neurological: Negative for headaches, focal weakness or numbness.   ____________________________________________   PHYSICAL EXAM:  VITAL SIGNS: ED Triage Vitals [10/01/18 1138]  Enc Vitals Group     BP (!) 162/80     Pulse Rate 84     Resp 16     Temp 98.1 F (36.7 C)     Temp Source Oral     SpO2 98 %     Weight 106 lb (48.1 kg)     Height 5\' 2"  (1.575 m)     Head Circumference      Peak Flow      Pain Score      Pain Loc      Pain Edu?      Excl. in [place]?     Constitutional: Alert and oriented. Well appearing and in no acute distress. Eyes: Conjunctivae are normal.  Head: Mild abrasion over the left lateral orbital region as well as the left cheek without any depression.  No tenderness to palpation.  No cephalohematoma. Nose: No congestion/rhinnorhea. Mouth/Throat: Mucous membranes are moist.  Neck: No stridor.  No tenderness to the midline cervical spine.  No deformity or  step-off. Cardiovascular: Normal rate, regular rhythm. Grossly normal heart sounds.   Respiratory: Normal respiratory effort.  No retractions. Lungs CTAB. Gastrointestinal: Soft and nontender. No distention. No CVA tenderness. Musculoskeletal: Minimal tenderness anteriorly over the right hip.  Pelvis is stable.  No limb shortening.  5 out of 5 strength on the left side without any limitation of motion.  On the right side there is no limitation to the range of motion with passive range of motion.  However, the patient experiences right groin and hip pain with active flexion of the hip.  When ambulating the patient is able to stand still without issue but then walks with  a limp.  Mild tenderness to palpation over the right, medial wrist over the ulnar styloid.  No deformity.  Full range of active range of motion to the right hand and wrist.  Neurovascularly intact. Neurologic:  Normal speech and language. No gross focal neurologic deficits are appreciated. Skin:  Skin is warm, dry and intact. No rash noted. Psychiatric: Mood and affect are normal. Speech and behavior are normal.  ____________________________________________   LABS (all labs ordered are listed, but only abnormal results are displayed)  Labs Reviewed - No data to display ____________________________________________  EKG   ____________________________________________  RADIOLOGY  No acute finding on the right hip nor is there any acute finding the right wrist. ____________________________________________   PROCEDURES  Procedure(s) performed:   Procedures  Critical Care performed:   ____________________________________________   INITIAL IMPRESSION / ASSESSMENT AND PLAN / ED COURSE  Pertinent labs & imaging results that were available during my care of the patient were reviewed by me and considered in my medical decision making (see chart for details).  DDX: Groin strain, contusion, hip fracture, pelvic fracture,  abrasion, ulnar fracture, ulnar contusion, wrist contusion As part of my medical decision making, I reviewed the following data within the electronic MEDICAL RECORD NUMBER Notes from prior ED visits  ----------------------------------------- 2:59 PM on 10/01/2018 -----------------------------------------  Patient with difficulty walking but able to bear weight.  We will scan the right hip to make sure there is not an occult fracture.  However, I discussed topical treatment as well as a heating pad with the patient's family.  Likely discharge to home as long as the CAT scan appears reassuring.  Signed out to Dr. Alfred Levins.  I also discussed head imaging with the patient and family.  They would like to forego this at this time.  2 days after the fall with the abrasion to the left side of the face and no focal neuro deficits.  They are not wanting to be aggressive with treatment and would not want the patient to have brain surgery at this time. ____________________________________________   FINAL CLINICAL IMPRESSION(S) / ED DIAGNOSES  Right-sided hip pain.  NEW MEDICATIONS STARTED DURING THIS VISIT:  New Prescriptions   No medications on file     Note:  This document was prepared using Dragon voice recognition software and may include unintentional dictation errors.     Orbie Pyo, MD 10/01/18 1500

## 2018-10-01 NOTE — Discharge Instructions (Addendum)
Pain control: Take tylenol 1000mg every 8 hours. Take 5mg of oxycodone every 6 hours for breakthrough pain. If you need the oxycodone make sure to take one senokot as well to prevent constipation.  Do not drink alcohol, drive or participate in any other potentially dangerous activities while taking this medication as it may make you sleepy. Do not take this medication with any other sedating medications, either prescription or over-the-counter.  

## 2018-10-15 ENCOUNTER — Emergency Department
Admission: EM | Admit: 2018-10-15 | Discharge: 2018-10-15 | Disposition: A | Discharge status: Home / Self Care | Payer: Medicare Other | Attending: Emergency Medicine | Admitting: Emergency Medicine

## 2018-10-15 ENCOUNTER — Other Ambulatory Visit: Payer: Self-pay

## 2018-10-15 ENCOUNTER — Emergency Department: Payer: Medicare Other

## 2018-10-15 ENCOUNTER — Encounter: Payer: Self-pay | Admitting: Emergency Medicine

## 2018-10-15 DIAGNOSIS — Z79899 Other long term (current) drug therapy: Secondary | ICD-10-CM | POA: Diagnosis not present

## 2018-10-15 DIAGNOSIS — I10 Essential (primary) hypertension: Secondary | ICD-10-CM | POA: Insufficient documentation

## 2018-10-15 DIAGNOSIS — S3282XD Multiple fractures of pelvis without disruption of pelvic ring, subsequent encounter for fracture with routine healing: Secondary | ICD-10-CM | POA: Diagnosis not present

## 2018-10-15 DIAGNOSIS — F0390 Unspecified dementia without behavioral disturbance: Secondary | ICD-10-CM | POA: Insufficient documentation

## 2018-10-15 DIAGNOSIS — M545 Low back pain: Secondary | ICD-10-CM | POA: Diagnosis not present

## 2018-10-15 DIAGNOSIS — M25552 Pain in left hip: Secondary | ICD-10-CM | POA: Diagnosis present

## 2018-10-15 DIAGNOSIS — X58XXXD Exposure to other specified factors, subsequent encounter: Secondary | ICD-10-CM | POA: Insufficient documentation

## 2018-10-15 DIAGNOSIS — Z7982 Long term (current) use of aspirin: Secondary | ICD-10-CM | POA: Diagnosis not present

## 2018-10-15 NOTE — ED Notes (Signed)
Pt back from xray. Resting. Family at bedside.

## 2018-10-15 NOTE — ED Notes (Signed)
Per family both L and R hips have been complained about on-off by pt. Per family more often when pt is moving around/walking. Bruising to L shin/LLE. Pt denies current pain.

## 2018-10-15 NOTE — ED Triage Notes (Signed)
Here 2 weeks ago for pain to hips after family had to struggle to keep her in house; pt has dementia.  Pt found to have small right pelvic fractures.  Has been walking okay per daughter but last night started c/o left hip pain and now cannot walk without holding on to something which is new since injury. No new injuries per daughter.

## 2018-10-15 NOTE — ED Notes (Signed)
Pt resting comfortably. Family at bedside. Pt denies any needs.

## 2018-10-15 NOTE — Discharge Instructions (Signed)
Use your walker at home to help stabilize yourself and decrease the weight on your hips.  Take tylenol 1000mg  three times a day, and use oxycodone only when necessary. Keep your appointment with Dr. Rudene Christians this week.

## 2018-10-15 NOTE — ED Notes (Signed)
Pt walked 250 ft with walker. Complains of extreme hip pain while walking. Vitals remain WDL while ambulating. Pt steady with walker.

## 2018-10-15 NOTE — ED Provider Notes (Signed)
[facility] Emergency Department Provider Note  ____________________________________________  Time seen: Approximately 4:31 PM  I have reviewed the triage vital signs and the nursing notes.   HISTORY  Chief Complaint Hip Pain  Level 5 Caveat: Portions of the History and Physical including HPI and review of systems are unable to be completely obtained due to patient being a poor historian due to chronic dementia   HPI Gabrielle Crosby is a 84 y.o. female with a history of bladder cancer and dementia and hypertension who comes the ED complaining of left hip pain and right lower back pain today.  Patient has a history of right pubic ramus fractures 2 weeks ago after minor trauma related to struggling with her family about keeping her from leaving her house and maintaining a safe environment for her.  Family note that she has had sleep rhythm disruptions recently, frequently awake during the night.  No acute symptoms that they have noticed recently.  Her right hip pain improved, but then since yesterday she is been complaining of left hip pain.  No new falls or other suspicious trauma.      Past Medical History:  Diagnosis Date  . Arthritis    hands  . Bladder cancer ([place])   . Brain bleed ([place]) 2019   d/t light strokes, high bp, seizures. brain has shrunk per dr. Manuella Ghazi and mri  . Cancer ([place])    BLADDER X 2  . Dementia ([place]) 2019   dementia progressing  . Depression   . GERD (gastroesophageal reflux disease)    chokes easily even on own saliva and then has panic attack  . Hyperlipidemia   . Hypertension 2019   no medications.  no htn per daughter  . Seizures  [facility])    family not aware of any seizures and no treatment  . Stroke [facility]) 2019   undiagnosed but per neuro, she has had them causing vascular dementia  . Wears dentures    partial upper and lower     Patient Active Problem List   Diagnosis Date Noted  . Loss of memory 07/26/2016  . Anxiety  06/26/2016  . Bradycardia 01/02/2015  . H/O deep venous thrombosis 01/02/2015  . MI (mitral incompetence) 01/02/2015  . Near syncope 01/02/2015  . Malignant neoplasm of urinary bladder ([place]) 08/22/2014  . Mixed Alzheimer's and vascular dementia ([place]) 08/22/2014  . Essential (primary) hypertension 08/22/2014  . Adaptive colitis 08/22/2014  . Lacunar infarction ([place]) 08/22/2014  . Combined fat and carbohydrate induced hyperlipemia 08/22/2014  . Allergic rhinitis, seasonal 08/22/2014     Past Surgical History:  Procedure Laterality Date  . ABDOMINAL HYSTERECTOMY Bilateral    BILATERAL s&o  . BACK SURGERY    . BLADDER SURGERY N/A 2015   REMOVAL OF BLADDER CANCER X 2  . BOWEL RESECTION N/A 1999   SMALL BOWEL OBSTRUCTION  . CHOLECYSTECTOMY    . COLONOSCOPY N/A 11/06/2015   Procedure: COLONOSCOPY;  Surgeon: Hulen Luster, MD;  Location: [place];  Service: Gastroenterology;  Laterality: N/A;  . CYSTOSCOPY WITH BIOPSY N/A 04/17/2015   Procedure: CYSTOSCOPY WITH BIOPSY;  Surgeon: Hollice Espy, MD;  Location: [facility];  Service: Urology;  Laterality: N/A;  . KYPHOPLASTY N/A 12/20/2017   Procedure: [id number];  Surgeon: Hessie Knows, MD;  Location: [facility];  Service: Orthopedics;  Laterality: N/A;  . KYPHOPLASTY N/A 04/21/2018   Procedure: KYPHOPLASTY-L2,L3,L4,L5;  Surgeon: Hessie Knows, MD;  Location: [facility];  Service: Orthopedics;  Laterality: N/A;  Prior to Admission medications   Medication Sig Start Date End Date Taking? Authorizing Provider  acetaminophen (TYLENOL) 500 MG tablet Take 1,000 mg by mouth every 6 (six) hours as needed for mild pain or moderate pain.    [provider]  aspirin EC 81 MG tablet Take 81 mg by mouth daily.    [provider]  citalopram (CELEXA) 20 MG tablet Take 20 mg by mouth daily.     [provider]  lovastatin (MEVACOR) 20 MG tablet TAKE 1 TABLET (20 MG TOTAL) BY MOUTH DAILY WITH DINNER. 05/27/15    [provider]  magnesium oxide (MAG-OX) 400 MG tablet Take 400 mg by mouth daily.    [provider]  memantine (NAMENDA) 10 MG tablet Take 10 mg by mouth 2 (two) times daily.  11/05/16   [provider]  nabumetone (RELAFEN) 500 MG tablet Take 500 mg by mouth 2 (two) times daily.    [provider]  oxyCODONE (ROXICODONE) 5 MG immediate release tablet Take 1 tablet (5 mg total) by mouth every 8 (eight) hours as needed. 10/01/18 10/01/19  Rudene Re, MD     Allergies Atenolol and Donepezil hcl   Family History  Family history unknown: Yes    Social History Social History   Tobacco Use  . Smoking status: Never Smoker  . Smokeless tobacco: Never Used  Substance Use Topics  . Alcohol use: Never    Alcohol/week: 0.0 standard drinks    Frequency: Never  . Drug use: No    Review of Systems  Constitutional:   No fever or chills.  ENT:   No sore throat. No rhinorrhea. Cardiovascular:   No chest pain or syncope. Respiratory:   No dyspnea or cough. Gastrointestinal:   Negative for abdominal pain, vomiting and diarrhea.  Musculoskeletal: Left hip pain as above, nonradiating All other systems reviewed and are negative except as documented above in ROS and HPI.  ____________________________________________   PHYSICAL EXAM:  VITAL SIGNS: ED Triage Vitals  Enc Vitals Group     BP 10/15/18 1141 131/63     Pulse Rate 10/15/18 1141 85     Resp 10/15/18 1141 16     Temp 10/15/18 1141 98.5 F (36.9 C)     Temp Source 10/15/18 1141 Oral     SpO2 10/15/18 1141 96 %     Weight 10/15/18 1138 106 lb (48.1 kg)     Height 10/15/18 1138 5\' 2"  (1.575 m)     Head Circumference --      Peak Flow --      Pain Score 10/15/18 1311 0     Pain Loc --      Pain Edu? --      Excl. in [place]? --     Vital signs reviewed, nursing assessments reviewed.   Constitutional:   Alert and oriented to person and place. Non-toxic appearance. Eyes:    Conjunctivae are normal. EOMI. PERRL. ENT      Head:   Normocephalic and atraumatic.      Nose:   No congestion/rhinnorhea.       Mouth/Throat:   MMM, no pharyngeal erythema. No peritonsillar mass.       Neck:   No meningismus. Full ROM. Hematological/Lymphatic/Immunilogical:   No cervical lymphadenopathy. Cardiovascular:   RRR. Symmetric bilateral radial and DP pulses.  No murmurs. Cap refill less than 2 seconds. Respiratory:   Normal respiratory effort without tachypnea/retractions. Breath sounds are clear and equal bilaterally. No wheezes/rales/rhonchi.  Gastrointestinal:   Soft and nontender. Non distended. There is no CVA tenderness.  No rebound, rigidity, or guarding. Genitourinary:   deferred Musculoskeletal:   Normal range of motion in all extremities. No joint effusions.  Slight tenderness at left anterior pelvis, no tenderness over the proximal femur.  Pelvis is stable.  No midline spinal tenderness. Neurologic:   Normal speech and language.  Motor grossly intact. No acute focal neurologic deficits are appreciated.  Skin:    Skin is warm, dry and intact. No rash noted.  No petechiae, purpura, or bullae.  ____________________________________________    LABS (pertinent positives/negatives) (all labs ordered are listed, but only abnormal results are displayed) Labs Reviewed - No data to display ____________________________________________   EKG    ____________________________________________    RADIOLOGY  Ct Lumbar Spine Wo Contrast  Result Date: 10/15/2018 CLINICAL DATA:  Acute left hip pain with difficulty walking. Recent right pubic rami fractures. History of multiple lumbar compression fractures status post augmentation. EXAM: CT LUMBAR SPINE WITHOUT CONTRAST TECHNIQUE: Multidetector CT imaging of the lumbar spine was performed without intravenous contrast administration. Multiplanar CT image reconstructions were also generated. COMPARISON:  Lumbar spine MRI  04/15/2018 FINDINGS: Segmentation: 5 lumbar type vertebrae. Alignment: Minimal left convex curvature of the lumbar spine. 3 mm anterolisthesis of L4 on L5, unchanged. Vertebrae: Interval augmentation of L2-L5 compression fractures shown on the prior MRI. Older L1 compression fracture again noted status post augmentation. Cement extends into the disc space at L4-5. Vertebral body height loss has not significantly progressed at any level. No acute fracture is identified. The bones are diffusely osteopenic. Paraspinal and other soft tissues: Extensive abdominal aortic and branch vessel calcific atherosclerosis. Disc levels: T10-11: Incompletely imaged. Chronic calcified left paracentral disc protrusion and mild facet hypertrophy result in moderate spinal and left lateral recess stenosis, similar to the prior MRI. T11-12: Moderate to severe right and moderate left facet arthrosis without stenosis, unchanged. T12-L1: Retropulsion of the L1 superior endplate results in mild spinal stenosis, unchanged. No significant neural foraminal stenosis. L1-2: Facet and ligamentum flavum hypertrophy without significant stenosis. L2-3: Disc bulging and moderate facet and ligamentum flavum hypertrophy result in mild spinal stenosis and minimal right neural foraminal stenosis, unchanged. L3-4: Disc bulging, endplate spurring, and moderate facet and ligamentum flavum hypertrophy result in moderate spinal stenosis and moderate right greater than left neural foraminal stenosis. Neural foraminal stenosis has mildly progressed. L4-5: Anterolisthesis with bulging uncovered disc and severe facet and ligamentum flavum hypertrophy result in moderate to severe spinal stenosis and moderate right neural foraminal stenosis, unchanged. L5-S1: Mild disc bulging and mild-to-moderate facet hypertrophy without stenosis, unchanged. IMPRESSION: 1. No acute osseous abnormality identified in the lumbar spine. 2. Interval augmentation of L2 and L5 compression  fractures. 3. Moderate to severe spinal stenosis at L4-5, unchanged. 4. Moderate spinal and bilateral neural foraminal stenosis at L3-4, mildly progressed. 5. Moderate spinal and left lateral recess stenosis at T10-11, unchanged. Aortic Atherosclerosis (ICD10-I70.0). Electronically Signed   By: Logan Bores M.D.   On: 10/15/2018 15:26   Ct Pelvis Wo Contrast  Result Date: 10/15/2018 CLINICAL DATA:  Fall 2 weeks ago, right pelvic fractures, now with left hip pain EXAM: CT PELVIS WITHOUT CONTRAST TECHNIQUE: Multidetector CT imaging of the pelvis was performed following the standard protocol without intravenous contrast. COMPARISON:  Left hip radiographs dated 10/15/2018. Right hip CT dated 10/01/2018. FINDINGS: Urinary Tract:  Bladder is within normal limits. Bowel:  Sigmoid diverticulosis, without evidence of diverticulitis. Vascular/Lymphatic: Atherosclerotic calcifications of the abdominal  aorta and branch vessels. No evidence of abdominal aortic aneurysm. No suspicious pelvic lymphadenopathy. Reproductive:  Status post hysterectomy.  No adnexal masses. Other:  No pelvic ascites. Musculoskeletal: Minimally displaced fracture of the right inferior pubic ramus (series 3/image 92). Nondisplaced fractures of the right superior pubic ramus (series 3/image 69) and right parasymphyseal region (series 3/image 77). These are slightly more conspicuous than on the prior. Possible nondisplaced right sacral fracture (series 3/image 33; coronal image 41), not previously imaged. No evidence of left hip fracture. Prior vertebral augmentation at L4 and L5. IMPRESSION: No evidence of left hip fracture. Right pelvic ring fractures, as above, slightly more conspicuous than on the prior. Possible nondisplaced right sacral fracture, not previously imaged. Electronically Signed   By: Julian Hy M.D.   On: 10/15/2018 15:15   Dg Hip Unilat W Or Wo Pelvis 2-3 Views Left  Result Date: 10/15/2018 CLINICAL DATA:  Left hip  pain, no known injury, initial encounter EXAM: DG HIP (WITH OR WITHOUT PELVIS) 3V LEFT COMPARISON:  None. FINDINGS: Pelvic ring is intact. Changes of prior hernia repair are noted. Mild degenerative changes of left hip joint are seen. No acute fracture or dislocation is seen. Changes consistent with kyphoplasty are noted. IMPRESSION: No acute bony abnormality noted. Electronically Signed   By: Inez Catalina M.D.   On: 10/15/2018 13:50    ____________________________________________   PROCEDURES Procedures  ____________________________________________  DIFFERENTIAL DIAGNOSIS   Pelvic fracture, lumbar compression fracture  CLINICAL IMPRESSION / ASSESSMENT AND PLAN / ED COURSE  Pertinent labs & imaging results that were available during my care of the patient were reviewed by me and considered in my medical decision making (see chart for details).    Patient presents with left hip pain, dementia. Hip xray negative, concern for occult fx as she had 2 weeks ago. Will obtain CT pelvis and lumbar spine.  Clinical Course as of Oct 15 1630  Sat Oct 15, 2018  1610 CT shows persistence, maybe slight worsening of pubic ramus fx and possible R sacral fx.  D/w ortho Dr. Posey Pronto. Rec. WBAT, walker, outpatient follow up.    [PS]    Clinical Course User Index [PS] Carrie Mew, MD     ____________________________________________   FINAL CLINICAL IMPRESSION(S) / ED DIAGNOSES    Final diagnoses:  Multiple closed fractures of pelvis without disruption of pelvic ring with routine healing, subsequent encounter     ED Discharge Orders    None      Portions of this note were generated with dragon dictation software. Dictation errors may occur despite best attempts at proofreading.    Carrie Mew, MD 10/15/18 [phone]

## 2018-10-15 NOTE — ED Notes (Signed)
Pt leaving for x-ray.  ?

## 2018-10-20 ENCOUNTER — Other Ambulatory Visit: Payer: Self-pay | Admitting: Orthopedic Surgery

## 2018-10-20 ENCOUNTER — Emergency Department: Payer: Medicare Other

## 2018-10-20 ENCOUNTER — Emergency Department
Admission: EM | Admit: 2018-10-20 | Discharge: 2018-10-20 | Disposition: A | Discharge status: Home / Self Care | Payer: Medicare Other | Attending: Emergency Medicine | Admitting: Emergency Medicine

## 2018-10-20 ENCOUNTER — Other Ambulatory Visit: Payer: Self-pay

## 2018-10-20 DIAGNOSIS — Y999 Unspecified external cause status: Secondary | ICD-10-CM | POA: Diagnosis not present

## 2018-10-20 DIAGNOSIS — Z7982 Long term (current) use of aspirin: Secondary | ICD-10-CM | POA: Diagnosis not present

## 2018-10-20 DIAGNOSIS — Z79899 Other long term (current) drug therapy: Secondary | ICD-10-CM | POA: Insufficient documentation

## 2018-10-20 DIAGNOSIS — S32511A Fracture of superior rim of right pubis, initial encounter for closed fracture: Secondary | ICD-10-CM

## 2018-10-20 DIAGNOSIS — Y929 Unspecified place or not applicable: Secondary | ICD-10-CM | POA: Insufficient documentation

## 2018-10-20 DIAGNOSIS — I10 Essential (primary) hypertension: Secondary | ICD-10-CM | POA: Insufficient documentation

## 2018-10-20 DIAGNOSIS — F0280 Dementia in other diseases classified elsewhere without behavioral disturbance: Secondary | ICD-10-CM | POA: Diagnosis not present

## 2018-10-20 DIAGNOSIS — G308 Other Alzheimer's disease: Secondary | ICD-10-CM | POA: Insufficient documentation

## 2018-10-20 DIAGNOSIS — Y939 Activity, unspecified: Secondary | ICD-10-CM | POA: Insufficient documentation

## 2018-10-20 DIAGNOSIS — S3282XA Multiple fractures of pelvis without disruption of pelvic ring, initial encounter for closed fracture: Secondary | ICD-10-CM | POA: Insufficient documentation

## 2018-10-20 DIAGNOSIS — S51012A Laceration without foreign body of left elbow, initial encounter: Secondary | ICD-10-CM | POA: Insufficient documentation

## 2018-10-20 DIAGNOSIS — W19XXXA Unspecified fall, initial encounter: Secondary | ICD-10-CM | POA: Diagnosis not present

## 2018-10-20 DIAGNOSIS — S3993XA Unspecified injury of pelvis, initial encounter: Secondary | ICD-10-CM | POA: Diagnosis present

## 2018-10-20 MED ORDER — TRAMADOL HCL 50 MG PO TABS
50.0000 mg | ORAL_TABLET | Freq: Once | ORAL | Status: AC
  Administered 2018-10-20: 50 mg via ORAL
  Filled 2018-10-20: qty 1

## 2018-10-20 NOTE — ED Notes (Signed)
Charles, RN placed a dressing to pts left elbow.

## 2018-10-20 NOTE — ED Triage Notes (Signed)
EMS stated that pt has had multiple falls for the past few days. Pt has right hip pain today. Pt has hx of dementia.

## 2018-10-20 NOTE — ED Notes (Signed)
Pts daughter stated that tonight when pts fell, she also injured her left elbow. Pt had a bandaged on area, it was removed and pt has a skin tear noted.

## 2018-10-20 NOTE — ED Provider Notes (Signed)
[facility] Emergency Department Provider Note ____   First MD Initiated Contact with Patient 10/20/18 [phone]     (approximate)  I have reviewed the triage vital signs and the nursing notes.  5 caveat history and physical exam limited secondary to dementia. HISTORY  Chief Complaint Fall and Hip Pain    HPI Gabrielle Crosby is a 84 y.o. female with below list of chronic medical conditions including recent visit to the emergency department on 10/15/2018 secondary to fall with multiple pelvic fractures at that time presents to the emergency department following fall tonight.  Patient states that "she lost balance.  Patient denies any loss of consciousness.  Patient admits to right hip/buttocks pain at present.  Patient's daughter states that they have an appointment today with orthopedic surgery secondary to the pelvic fractures.  She has not received any physical therapy to date.  Patient's daughter states that the patient gets up at night and tends to wander and as a result falls upon getting out of bed.  Past Medical History:  Diagnosis Date  . Arthritis    hands  . Bladder cancer ([place])   . Brain bleed ([place]) 2019   d/t light strokes, high bp, seizures. brain has shrunk per dr. Manuella Ghazi and mri  . Cancer ([place])    BLADDER X 2  . Dementia ([place]) 2019   dementia progressing  . Depression   . GERD (gastroesophageal reflux disease)    chokes easily even on own saliva and then has panic attack  . Hyperlipidemia   . Hypertension 2019   no medications.  no htn per daughter  . Seizures [facility])    family not aware of any seizures and no treatment  . Stroke [facility]) 2019   undiagnosed but per neuro, she has had them causing vascular dementia  . Wears dentures    partial upper and lower    Patient Active Problem List   Diagnosis Date Noted  . Loss of memory 07/26/2016  . Anxiety 06/26/2016  . Bradycardia 01/02/2015  . H/O deep venous thrombosis 01/02/2015  . MI  (mitral incompetence) 01/02/2015  . Near syncope 01/02/2015  . Malignant neoplasm of urinary bladder ([place]) 08/22/2014  . Mixed Alzheimer's and vascular dementia ([place]) 08/22/2014  . Essential (primary) hypertension 08/22/2014  . Adaptive colitis 08/22/2014  . Lacunar infarction ([place]) 08/22/2014  . Combined fat and carbohydrate induced hyperlipemia 08/22/2014  . Allergic rhinitis, seasonal 08/22/2014    Past Surgical History:  Procedure Laterality Date  . ABDOMINAL HYSTERECTOMY Bilateral    BILATERAL s&o  . BACK SURGERY    . BLADDER SURGERY N/A 2015   REMOVAL OF BLADDER CANCER X 2  . BOWEL RESECTION N/A 1999   SMALL BOWEL OBSTRUCTION  . CHOLECYSTECTOMY    . COLONOSCOPY N/A 11/06/2015   Procedure: COLONOSCOPY;  Surgeon: Hulen Luster, MD;  Location: [place];  Service: Gastroenterology;  Laterality: N/A;  . CYSTOSCOPY WITH BIOPSY N/A 04/17/2015   Procedure: CYSTOSCOPY WITH BIOPSY;  Surgeon: Hollice Espy, MD;  Location: [facility];  Service: Urology;  Laterality: N/A;  . KYPHOPLASTY N/A 12/20/2017   Procedure: [id number];  Surgeon: Hessie Knows, MD;  Location: [facility];  Service: Orthopedics;  Laterality: N/A;  . KYPHOPLASTY N/A 04/21/2018   Procedure: KYPHOPLASTY-L2,L3,L4,L5;  Surgeon: Hessie Knows, MD;  Location: [facility];  Service: Orthopedics;  Laterality: N/A;    Prior to Admission medications   Medication Sig Start Date End Date Taking? Authorizing Provider  acetaminophen (TYLENOL) 500  MG tablet Take 1,000 mg by mouth every 6 (six) hours as needed for mild pain or moderate pain.    [provider]  aspirin EC 81 MG tablet Take 81 mg by mouth daily.    [provider]  citalopram (CELEXA) 20 MG tablet Take 20 mg by mouth daily.     [provider]  lovastatin (MEVACOR) 20 MG tablet TAKE 1 TABLET (20 MG TOTAL) BY MOUTH DAILY WITH DINNER. 05/27/15   [provider]  magnesium oxide (MAG-OX) 400 MG tablet Take 400 mg by mouth daily.     [provider]  memantine (NAMENDA) 10 MG tablet Take 10 mg by mouth 2 (two) times daily.  11/05/16   [provider]  nabumetone (RELAFEN) 500 MG tablet Take 500 mg by mouth 2 (two) times daily.    [provider]  oxyCODONE (ROXICODONE) 5 MG immediate release tablet Take 1 tablet (5 mg total) by mouth every 8 (eight) hours as needed. 10/01/18 10/01/19  Rudene Re, MD    Allergies Atenolol and Donepezil hcl  Family History  Family history unknown: Yes    Social History Social History   Tobacco Use  . Smoking status: Never Smoker  . Smokeless tobacco: Never Used  Substance Use Topics  . Alcohol use: Never    Alcohol/week: 0.0 standard drinks    Frequency: Never  . Drug use: No    Review of Systems Constitutional: No fever/chills Eyes: No visual changes. ENT: No sore throat. Cardiovascular: Denies chest pain. Respiratory: Denies shortness of breath. Gastrointestinal: No abdominal pain.  No nausea, no vomiting.  No diarrhea.  No constipation. Genitourinary: Negative for dysuria. Musculoskeletal: Negative for neck pain.  Positive  for back pain.  Positive for right hip/buttocks pain. Integumentary: Negative for rash. Neurological: Negative for headaches, focal weakness or numbness.   ____________________________________________   PHYSICAL EXAM:  VITAL SIGNS: ED Triage Vitals [10/20/18 0303]  Enc Vitals Group     BP 137/70     Pulse Rate 92     Resp 16     Temp 98.8 F (37.1 C)     Temp Source Oral     SpO2 99 %     Weight 52.2 kg (115 lb)     Height 1.499 m (4\' 11" )     Head Circumference      Peak Flow      Pain Score      Pain Loc      Pain Edu?      Excl. in [place]?     Constitutional: Alert and oriented. Well appearing and in no acute distress. Eyes: Conjunctivae are normal.  PERRLA EOMI Head: Atraumatic. Mouth/Throat: Mucous membranes are moist. Oropharynx non-erythematous. Neck: No stridor.  No cervical spine  tenderness to palpation. Cardiovascular: Normal rate, regular rhythm. Good peripheral circulation. Grossly normal heart sounds. Respiratory: Normal respiratory effort.  No retractions. Lungs CTAB. Gastrointestinal: Soft and nontender. No distention.  Musculoskeletal: No lower extremity tenderness nor edema. No gross deformities of extremities. Neurologic:  Normal speech and language. No gross focal neurologic deficits are appreciated.  Skin: Left elbow skin tear with irregular borders. Psychiatric: Mood and affect are normal. Speech and behavior are normal.  __________________  RADIOLOGY I, [place] Saraih Lorton, personally viewed and evaluated these images (plain radiographs) as part of my medical decision making, as well as reviewing the written report by the radiologist.  ED MD interpretation: No acute fracture or dislocation noted on lumbar spine.  Previously noted  pelvic fractures on pelvic/right hip x-rays  Official radiology report(s): Dg Lumbar Spine Complete  Result Date: 10/20/2018 CLINICAL DATA:  Multiple falls over the past few days. Low back pain. EXAM: LUMBAR SPINE - COMPLETE 4+ VIEW COMPARISON:  CT lumbar spine 10/15/2018 FINDINGS: Diffuse bone demineralization. Compression fractures of the L1 through L5 vertebra, status post kyphoplasty at all 5 levels. No obvious progression since the previous study. Normal alignment of the lumbar spine. Visualized sacrum appears intact. Surgical clips in the right upper quadrant and in the pelvis. Aortic calcification. Calcification of the mitral valve annulus. IMPRESSION: Diffuse bone demineralization. Old compression fractures of L1 through L5 post kyphoplasty. No acute displaced fractures identified. Electronically Signed   By: Lucienne Capers M.D.   On: 10/20/2018 04:10   Dg Hip Unilat W Or Wo Pelvis 2-3 Views Right  Addendum Date: 10/20/2018   ADDENDUM REPORT: 10/20/2018 04:07 ADDENDUM: Comparison with the previous CT pelvis from  10/15/2018 shows that the right pubic ramus fractures were present at that time and do not represent new acute fractures. Electronically Signed   By: Lucienne Capers M.D.   On: 10/20/2018 04:07   Result Date: 10/20/2018 CLINICAL DATA:  Multiple falls over the past few days. Right hip and low back pain. EXAM: DG HIP (WITH OR WITHOUT PELVIS) 2-3V RIGHT COMPARISON:  10/01/2018 FINDINGS: Diffuse bone demineralization. Vertebral compression fractures at L3, L4, and L5 post kyphoplasty. Degenerative changes in the lower lumbar spine and in both hips. Suggestion of a nondisplaced post fracture in the superior pubic ramus, new since previous study with linear lucency present. Old appearing fracture deformity of the inferior pubic ramus. No evidence of acute fracture or dislocation of the right hip. Soft tissues are unremarkable. Surgical clips consistent with previous hernia repair. IMPRESSION: Suggestion of a nondisplaced fracture of the right superior pubic ramus. Degenerative changes in the right hip. No evidence of fracture or dislocation of the hip. Electronically Signed: By: Lucienne Capers M.D. On: 10/20/2018 04:02      Procedures   ____________________________________________   INITIAL IMPRESSION / ASSESSMENT AND PLAN / ED COURSE  As part of my medical decision making, I reviewed the following data within the electronic MEDICAL RECORD NUMBER  84 year old female presenting with above-stated history and physical exam following fall.  X-ray consistent with previous noted pelvic fractures.  Patient with no evidence of head injury and as such CT was not performed.  Regarding the patient's left elbow skin tear skin tear dressing applied.  Spoke with the patient daughter at length regarding necessity of following up with orthopedic surgery as planned for today. ____________________________________________  FINAL CLINICAL IMPRESSION(S) / ED DIAGNOSES  Final diagnoses:  Skin tear of left elbow without  complication, initial encounter  Multiple closed fractures of pelvis without disruption of pelvic ring, initial encounter ([place])     MEDICATIONS GIVEN DURING THIS VISIT:  Medications  traMADol (ULTRAM) tablet 50 mg (50 mg Oral Given 10/20/18 0418)     ED Discharge Orders    None       Note:  This document was prepared using Dragon voice recognition software and may include unintentional dictation errors.    Gregor Hams, MD 10/20/18 [phone]

## 2018-10-24 ENCOUNTER — Ambulatory Visit
Admission: RE | Admit: 2018-10-24 | Discharge: 2018-10-24 | Disposition: A | Discharge status: Home / Self Care | Payer: Medicare Other | Source: Ambulatory Visit | Attending: Orthopedic Surgery | Admitting: Orthopedic Surgery

## 2018-10-24 DIAGNOSIS — S32511A Fracture of superior rim of right pubis, initial encounter for closed fracture: Secondary | ICD-10-CM

## 2018-10-24 DIAGNOSIS — K5730 Diverticulosis of large intestine without perforation or abscess without bleeding: Secondary | ICD-10-CM | POA: Insufficient documentation

## 2018-11-01 ENCOUNTER — Inpatient Hospital Stay: Admission: RE | Admit: 2018-11-01 | Payer: Medicare Other | Source: Ambulatory Visit

## 2018-11-02 ENCOUNTER — Other Ambulatory Visit: Payer: Self-pay

## 2018-11-02 ENCOUNTER — Encounter
Admission: RE | Admit: 2018-11-02 | Discharge: 2018-11-02 | Disposition: A | Discharge status: Home / Self Care | Payer: Medicare Other | Source: Ambulatory Visit | Attending: Orthopedic Surgery | Admitting: Orthopedic Surgery

## 2018-11-02 DIAGNOSIS — S32511A Fracture of superior rim of right pubis, initial encounter for closed fracture: Secondary | ICD-10-CM

## 2018-11-02 DIAGNOSIS — K219 Gastro-esophageal reflux disease without esophagitis: Secondary | ICD-10-CM | POA: Diagnosis not present

## 2018-11-02 DIAGNOSIS — I10 Essential (primary) hypertension: Secondary | ICD-10-CM | POA: Diagnosis not present

## 2018-11-02 DIAGNOSIS — R9431 Abnormal electrocardiogram [ECG] [EKG]: Secondary | ICD-10-CM | POA: Insufficient documentation

## 2018-11-02 DIAGNOSIS — W19XXXA Unspecified fall, initial encounter: Secondary | ICD-10-CM | POA: Diagnosis not present

## 2018-11-02 DIAGNOSIS — Z8249 Family history of ischemic heart disease and other diseases of the circulatory system: Secondary | ICD-10-CM | POA: Diagnosis not present

## 2018-11-02 DIAGNOSIS — Z8673 Personal history of transient ischemic attack (TIA), and cerebral infarction without residual deficits: Secondary | ICD-10-CM

## 2018-11-02 DIAGNOSIS — I6930 Unspecified sequelae of cerebral infarction: Secondary | ICD-10-CM | POA: Diagnosis not present

## 2018-11-02 DIAGNOSIS — Z8551 Personal history of malignant neoplasm of bladder: Secondary | ICD-10-CM | POA: Diagnosis not present

## 2018-11-02 DIAGNOSIS — Z90710 Acquired absence of both cervix and uterus: Secondary | ICD-10-CM | POA: Diagnosis not present

## 2018-11-02 DIAGNOSIS — F419 Anxiety disorder, unspecified: Secondary | ICD-10-CM | POA: Diagnosis not present

## 2018-11-02 DIAGNOSIS — Z820 Family history of epilepsy and other diseases of the nervous system: Secondary | ICD-10-CM | POA: Diagnosis not present

## 2018-11-02 DIAGNOSIS — Z01818 Encounter for other preprocedural examination: Secondary | ICD-10-CM | POA: Insufficient documentation

## 2018-11-02 DIAGNOSIS — X58XXXA Exposure to other specified factors, initial encounter: Secondary | ICD-10-CM | POA: Insufficient documentation

## 2018-11-02 DIAGNOSIS — F329 Major depressive disorder, single episode, unspecified: Secondary | ICD-10-CM | POA: Diagnosis not present

## 2018-11-02 DIAGNOSIS — Z79899 Other long term (current) drug therapy: Secondary | ICD-10-CM | POA: Diagnosis not present

## 2018-11-02 DIAGNOSIS — E7800 Pure hypercholesterolemia, unspecified: Secondary | ICD-10-CM | POA: Diagnosis not present

## 2018-11-02 DIAGNOSIS — M1990 Unspecified osteoarthritis, unspecified site: Secondary | ICD-10-CM | POA: Diagnosis not present

## 2018-11-02 DIAGNOSIS — K589 Irritable bowel syndrome without diarrhea: Secondary | ICD-10-CM | POA: Diagnosis not present

## 2018-11-02 DIAGNOSIS — Z7982 Long term (current) use of aspirin: Secondary | ICD-10-CM | POA: Diagnosis not present

## 2018-11-02 DIAGNOSIS — Z9049 Acquired absence of other specified parts of digestive tract: Secondary | ICD-10-CM | POA: Diagnosis not present

## 2018-11-02 DIAGNOSIS — Z86718 Personal history of other venous thrombosis and embolism: Secondary | ICD-10-CM | POA: Diagnosis not present

## 2018-11-02 DIAGNOSIS — Z809 Family history of malignant neoplasm, unspecified: Secondary | ICD-10-CM | POA: Diagnosis not present

## 2018-11-02 DIAGNOSIS — S3210XA Unspecified fracture of sacrum, initial encounter for closed fracture: Secondary | ICD-10-CM | POA: Diagnosis not present

## 2018-11-02 DIAGNOSIS — F0390 Unspecified dementia without behavioral disturbance: Secondary | ICD-10-CM | POA: Diagnosis not present

## 2018-11-02 LAB — CBC
HCT: 39.1 % (ref 36.0–46.0)
HEMOGLOBIN: 12.3 g/dL (ref 12.0–15.0)
MCH: 30 pg (ref 26.0–34.0)
MCHC: 31.5 g/dL (ref 30.0–36.0)
MCV: 95.4 fL (ref 80.0–100.0)
NRBC: 0 % (ref 0.0–0.2)
Platelets: 378 10*3/uL (ref 150–400)
RBC: 4.1 MIL/uL (ref 3.87–5.11)
RDW: 13.5 % (ref 11.5–15.5)
WBC: 4.5 10*3/uL (ref 4.0–10.5)

## 2018-11-02 LAB — BASIC METABOLIC PANEL
ANION GAP: 8 (ref 5–15)
BUN: 22 mg/dL (ref 8–23)
CHLORIDE: 106 mmol/L (ref 98–111)
CO2: 27 mmol/L (ref 22–32)
Calcium: 8.8 mg/dL — ABNORMAL LOW (ref 8.9–10.3)
Creatinine, Ser: 0.69 mg/dL (ref 0.44–1.00)
GFR calc Af Amer: >60 mL/min (ref >60)
GFR calc non Af Amer: >60 mL/min (ref >60)
Glucose, Bld: 103 mg/dL — ABNORMAL HIGH (ref 70–99)
POTASSIUM: 3.9 mmol/L (ref 3.5–5.1)
Sodium: 141 mmol/L (ref 135–145)

## 2018-11-02 MED ORDER — CEFAZOLIN SODIUM-DEXTROSE 1-4 GM/50ML-% IV SOLN
1.0000 g | Freq: Once | INTRAVENOUS | Status: AC
  Administered 2018-11-03: 1 g via INTRAVENOUS

## 2018-11-02 NOTE — Patient Instructions (Addendum)
Your procedure is scheduled on: 11/03/18 Thurs at 2pm Report to Same Day Surgery 2nd floor medical mall [facility] Entrance-take elevator on left to 2nd floor.  Check in with surgery information desk.)   Remember: Instructions that are not followed completely may result in serious medical risk, up to and including death, or upon the discretion of your surgeon and anesthesiologist your surgery may need to be rescheduled.    _x___ 1. Do not eat food after midnight the night before your procedure. You may drink clear liquids up to 2 hours before you are scheduled to arrive at the hospital for your procedure.  Do not drink clear liquids within 2 hours of your scheduled arrival to the hospital.  Clear liquids include  --Water or Apple juice without pulp  --Clear carbohydrate beverage such as ClearFast or Gatorade  --Black Coffee or Clear Tea (No milk, no creamers, do not add anything to                  the coffee or Tea Type 1 and type 2 diabetics should only drink water.   ____Ensure clear carbohydrate drink on the way to the hospital for bariatric patients  ____Ensure clear carbohydrate drink 3 hours before surgery for Dr Dwyane Luo patients if physician instructed.   No gum chewing or hard candies.     __x__ 2. No Alcohol for 24 hours before or after surgery.   __x__3. No Smoking or e-cigarettes for 24 prior to surgery.  Do not use any chewable tobacco products for at least 6 hour prior to surgery   ____  4. Bring all medications with you on the day of surgery if instructed.    __x__ 5. Notify your doctor if there is any change in your medical condition     (cold, fever, infections).    x___6. On the morning of surgery brush your teeth with toothpaste and water.  You may rinse your mouth with mouth wash if you wish.  Do not swallow any toothpaste or mouthwash.   Do not wear jewelry, make-up, hairpins, clips or nail polish.  Do not wear lotions, powders, or perfumes. You may wear  deodorant.  Do not shave 48 hours prior to surgery. Men may shave face and neck.  Do not bring valuables to the hospital.    [facility] is not responsible for any belongings or valuables.               Contacts, dentures or bridgework may not be worn into surgery.  Leave your suitcase in the car. After surgery it may be brought to your room.  For patients admitted to the hospital, discharge time is determined by your                       treatment team.  _  Patients discharged the day of surgery will not be allowed to drive home.  You will need someone to drive you home and stay with you the night of your procedure.    Please read over the following fact sheets that you were given:   [facility] Preparing for Surgery and or MRSA Information   _x___ Take anti-hypertensive listed below, cardiac, seizure, asthma,     anti-reflux and psychiatric medicines. These include:  1. citalopram (CELEXA) 20 MG tablet  2.memantine (NAMENDA)  3.oxyCODONE (ROXICODONE) 5 MG immediate release tablet if needed  4.  5.  6.  ____Fleets enema or Magnesium Citrate as directed.  _x___ Use CHG Soap or sage wipes as directed on instruction sheet   ____ Use inhalers on the day of surgery and bring to hospital day of surgery  ____ Stop Metformin and Janumet 2 days prior to surgery.    ____ Take 1/2 of usual insulin dose the night before surgery and none on the morning     surgery.   _x___ Follow recommendations from Cardiologist, Pulmonologist or PCP regarding          stopping Aspirin, Coumadin, Plavix ,Eliquis, Effient, or Pradaxa, and Pletal.  X____Stop Anti-inflammatories such as Advil, Aleve, Ibuprofen, Motrin, Naproxen, Naprosyn, Goodies powders or aspirin products. OK to take Tylenol and                          Celebrex.   _x___ Stop supplements until after surgery.  But may continue Vitamin D, Vitamin B,       and multivitamin.   ____ Bring C-Pap to the hospital.

## 2018-11-03 ENCOUNTER — Ambulatory Visit
Admission: RE | Admit: 2018-11-03 | Discharge: 2018-11-03 | Disposition: A | Discharge status: Home / Self Care | Payer: Medicare Other | Source: Ambulatory Visit | Attending: Orthopedic Surgery | Admitting: Orthopedic Surgery

## 2018-11-03 ENCOUNTER — Ambulatory Visit: Payer: Medicare Other

## 2018-11-03 ENCOUNTER — Encounter: Payer: Self-pay | Admitting: Anesthesiology

## 2018-11-03 ENCOUNTER — Ambulatory Visit: Payer: Medicare Other | Admitting: Anesthesiology

## 2018-11-03 ENCOUNTER — Encounter
Admission: RE | Disposition: A | Discharge status: Home / Self Care | Payer: Self-pay | Source: Ambulatory Visit | Attending: Orthopedic Surgery

## 2018-11-03 DIAGNOSIS — F329 Major depressive disorder, single episode, unspecified: Secondary | ICD-10-CM | POA: Insufficient documentation

## 2018-11-03 DIAGNOSIS — F419 Anxiety disorder, unspecified: Secondary | ICD-10-CM | POA: Insufficient documentation

## 2018-11-03 DIAGNOSIS — Z419 Encounter for procedure for purposes other than remedying health state, unspecified: Secondary | ICD-10-CM

## 2018-11-03 DIAGNOSIS — F0390 Unspecified dementia without behavioral disturbance: Secondary | ICD-10-CM | POA: Insufficient documentation

## 2018-11-03 DIAGNOSIS — I6930 Unspecified sequelae of cerebral infarction: Secondary | ICD-10-CM | POA: Insufficient documentation

## 2018-11-03 DIAGNOSIS — Z86718 Personal history of other venous thrombosis and embolism: Secondary | ICD-10-CM | POA: Insufficient documentation

## 2018-11-03 DIAGNOSIS — Z8249 Family history of ischemic heart disease and other diseases of the circulatory system: Secondary | ICD-10-CM | POA: Insufficient documentation

## 2018-11-03 DIAGNOSIS — K589 Irritable bowel syndrome without diarrhea: Secondary | ICD-10-CM | POA: Insufficient documentation

## 2018-11-03 DIAGNOSIS — Z8551 Personal history of malignant neoplasm of bladder: Secondary | ICD-10-CM | POA: Insufficient documentation

## 2018-11-03 DIAGNOSIS — S3210XA Unspecified fracture of sacrum, initial encounter for closed fracture: Secondary | ICD-10-CM | POA: Insufficient documentation

## 2018-11-03 DIAGNOSIS — W19XXXA Unspecified fall, initial encounter: Secondary | ICD-10-CM | POA: Insufficient documentation

## 2018-11-03 DIAGNOSIS — M1990 Unspecified osteoarthritis, unspecified site: Secondary | ICD-10-CM | POA: Insufficient documentation

## 2018-11-03 DIAGNOSIS — Z820 Family history of epilepsy and other diseases of the nervous system: Secondary | ICD-10-CM | POA: Insufficient documentation

## 2018-11-03 DIAGNOSIS — Z809 Family history of malignant neoplasm, unspecified: Secondary | ICD-10-CM | POA: Insufficient documentation

## 2018-11-03 DIAGNOSIS — E7800 Pure hypercholesterolemia, unspecified: Secondary | ICD-10-CM | POA: Insufficient documentation

## 2018-11-03 DIAGNOSIS — Z90710 Acquired absence of both cervix and uterus: Secondary | ICD-10-CM | POA: Insufficient documentation

## 2018-11-03 DIAGNOSIS — K219 Gastro-esophageal reflux disease without esophagitis: Secondary | ICD-10-CM | POA: Insufficient documentation

## 2018-11-03 DIAGNOSIS — Z79899 Other long term (current) drug therapy: Secondary | ICD-10-CM | POA: Insufficient documentation

## 2018-11-03 DIAGNOSIS — Z7982 Long term (current) use of aspirin: Secondary | ICD-10-CM | POA: Insufficient documentation

## 2018-11-03 DIAGNOSIS — Z9049 Acquired absence of other specified parts of digestive tract: Secondary | ICD-10-CM | POA: Insufficient documentation

## 2018-11-03 DIAGNOSIS — I10 Essential (primary) hypertension: Secondary | ICD-10-CM | POA: Insufficient documentation

## 2018-11-03 HISTORY — PX: SACROPLASTY: SHX6797

## 2018-11-03 SURGERY — SACROPLASTY
Anesthesia: General

## 2018-11-03 MED ORDER — BUPIVACAINE-EPINEPHRINE (PF) 0.5% -1:200000 IJ SOLN
INTRAMUSCULAR | Status: DC | PRN
  Administered 2018-11-03: 15 mL via PERINEURAL

## 2018-11-03 MED ORDER — KETAMINE HCL 50 MG/ML IJ SOLN
INTRAMUSCULAR | Status: AC
  Filled 2018-11-03: qty 10

## 2018-11-03 MED ORDER — PROPOFOL 500 MG/50ML IV EMUL
INTRAVENOUS | Status: DC | PRN
  Administered 2018-11-03: 50 ug/kg/min via INTRAVENOUS

## 2018-11-03 MED ORDER — OXYCODONE HCL 5 MG PO TABS: 5.0000 mg | ORAL_TABLET | Freq: Once | ORAL | Status: DC | PRN

## 2018-11-03 MED ORDER — METOCLOPRAMIDE HCL 10 MG PO TABS: 5.0000 mg | ORAL_TABLET | Freq: Three times a day (TID) | ORAL | Status: DC | PRN

## 2018-11-03 MED ORDER — PROPOFOL 10 MG/ML IV BOLUS
INTRAVENOUS | Status: DC | PRN
  Administered 2018-11-03: 20 mg via INTRAVENOUS
  Administered 2018-11-03: 80 mg via INTRAVENOUS

## 2018-11-03 MED ORDER — BUPIVACAINE-EPINEPHRINE (PF) 0.5% -1:200000 IJ SOLN
INTRAMUSCULAR | Status: AC
  Filled 2018-11-03: qty 30

## 2018-11-03 MED ORDER — CEFAZOLIN SODIUM-DEXTROSE 1-4 GM/50ML-% IV SOLN
INTRAVENOUS | Status: AC
  Filled 2018-11-03: qty 50

## 2018-11-03 MED ORDER — PROPOFOL 500 MG/50ML IV EMUL
INTRAVENOUS | Status: AC
  Filled 2018-11-03: qty 50

## 2018-11-03 MED ORDER — METOCLOPRAMIDE HCL 5 MG/ML IJ SOLN: 5.0000 mg | Freq: Three times a day (TID) | INTRAMUSCULAR | Status: DC | PRN

## 2018-11-03 MED ORDER — LIDOCAINE HCL (PF) 1 % IJ SOLN
INTRAMUSCULAR | Status: DC | PRN
  Administered 2018-11-03: 25 mL

## 2018-11-03 MED ORDER — KETAMINE HCL 50 MG/ML IJ SOLN
INTRAMUSCULAR | Status: DC | PRN
  Administered 2018-11-03: 10 mg via INTRAMUSCULAR
  Administered 2018-11-03: 20 mg via INTRAVENOUS

## 2018-11-03 MED ORDER — ONDANSETRON HCL 4 MG/2ML IJ SOLN: 4.0000 mg | Freq: Four times a day (QID) | INTRAMUSCULAR | Status: DC | PRN

## 2018-11-03 MED ORDER — OXYCODONE HCL 5 MG/5ML PO SOLN: 5.0000 mg | Freq: Once | ORAL | Status: DC | PRN

## 2018-11-03 MED ORDER — ONDANSETRON HCL 4 MG PO TABS: 4.0000 mg | ORAL_TABLET | Freq: Four times a day (QID) | ORAL | Status: DC | PRN

## 2018-11-03 MED ORDER — FENTANYL CITRATE (PF) 100 MCG/2ML IJ SOLN
INTRAMUSCULAR | Status: AC
  Filled 2018-11-03: qty 2

## 2018-11-03 MED ORDER — SODIUM CHLORIDE 0.9 % IV SOLN: INTRAVENOUS | Status: DC

## 2018-11-03 MED ORDER — OXYCODONE HCL 5 MG PO TABS: 5.0000 mg | ORAL_TABLET | Freq: Three times a day (TID) | ORAL | 0 refills | Status: DC | PRN

## 2018-11-03 MED ORDER — FAMOTIDINE 20 MG PO TABS
ORAL_TABLET | ORAL | Status: AC
  Administered 2018-11-03: 20 mg via ORAL
  Filled 2018-11-03: qty 1

## 2018-11-03 MED ORDER — LIDOCAINE HCL (PF) 1 % IJ SOLN
INTRAMUSCULAR | Status: AC
  Filled 2018-11-03: qty 30

## 2018-11-03 MED ORDER — LACTATED RINGERS IV SOLN
INTRAVENOUS | Status: DC
  Administered 2018-11-03: 14:00:00 via INTRAVENOUS

## 2018-11-03 MED ORDER — FENTANYL CITRATE (PF) 100 MCG/2ML IJ SOLN: 25.0000 ug | INTRAMUSCULAR | Status: DC | PRN

## 2018-11-03 MED ORDER — HYDROCODONE-ACETAMINOPHEN 5-325 MG PO TABS: 1.0000 | ORAL_TABLET | ORAL | Status: DC | PRN

## 2018-11-03 MED ORDER — FAMOTIDINE 20 MG PO TABS
20.0000 mg | ORAL_TABLET | Freq: Once | ORAL | Status: AC
  Administered 2018-11-03: 20 mg via ORAL

## 2018-11-03 SURGICAL SUPPLY — 20 items
BIT DRILL KYPHON EXPRESS (MISCELLANEOUS) ×2 IMPLANT
CEMENT KYPHON CX01A KIT/MIXER (Cement) ×2 IMPLANT
COVER WAND RF STERILE (DRAPES) ×2 IMPLANT
DERMABOND ADVANCED (GAUZE/BANDAGES/DRESSINGS) ×1
DERMABOND ADVANCED .7 DNX12 (GAUZE/BANDAGES/DRESSINGS) ×1 IMPLANT
DEVICE BIOPSY BONE KYPH (INSTRUMENTS) ×12 IMPLANT
DRAPE C-ARM XRAY 36X54 (DRAPES) ×2 IMPLANT
DRILL KYPHON EXPRESS (MISCELLANEOUS) ×4
DURAPREP 26ML APPLICATOR (WOUND CARE) ×2 IMPLANT
GLOVE SURG SYN 9.0  PF PI (GLOVE) ×1
GLOVE SURG SYN 9.0 PF PI (GLOVE) ×1 IMPLANT
GOWN SRG 2XL LVL 4 RGLN SLV (GOWNS) ×1 IMPLANT
GOWN STRL NON-REIN 2XL LVL4 (GOWNS) ×1
GOWN STRL REUS W/ TWL LRG LVL3 (GOWN DISPOSABLE) ×1 IMPLANT
GOWN STRL REUS W/TWL LRG LVL3 (GOWN DISPOSABLE) ×1
KIT TURNOVER KIT A (KITS) ×2 IMPLANT
PACK KYPHOPLASTY (MISCELLANEOUS) ×2 IMPLANT
SYS CARTRIDGE BONE CEMENT 8ML (SYSTAGENIX WOUND MANAGEMENT) ×2
SYSTEM CARTRIDG BONE CEMNT 8ML (SYSTAGENIX WOUND MANAGEMENT) IMPLANT
SYSTEM GUN BONE FILLER SZ2 (MISCELLANEOUS) ×1 IMPLANT

## 2018-11-03 NOTE — Discharge Instructions (Addendum)
Take it easy today and tomorrow and then as pain resolves increase activity as tolerated.  Take Band-Aids off Saturday then okay to shower but do not soak in the tub.  Pain medicine as directed.  Call office if you are having problems    AMBULATORY SURGERY  DISCHARGE INSTRUCTIONS   1) The drugs that you were given will stay in your system until tomorrow so for the next 24 hours you should not:  A) Drive an automobile B) Make any legal decisions C) Drink any alcoholic beverage   2) You may resume regular meals tomorrow.  Today it is better to start with liquids and gradually work up to solid foods.  You may eat anything you prefer, but it is better to start with liquids, then soup and crackers, and gradually work up to solid foods.   3) Please notify your doctor immediately if you have any unusual bleeding, trouble breathing, redness and pain at the surgery site, drainage, fever, or pain not relieved by medication.    4) Additional Instructions:        Please contact your physician with any problems or Same Day Surgery at [phone], Monday through Friday 6 am to 4 pm, or [place] at [facility] number at [phone].

## 2018-11-03 NOTE — Op Note (Signed)
11/03/2018  3:10 PM  PATIENT:  Gabrielle [place]  84 y.o. female  PRE-OPERATIVE DIAGNOSIS:  CLOSED FRACTURE OF sacrum  POST-OPERATIVE DIAGNOSIS:  CLOSED FRACTURE OF sacrum  PROCEDURE:  Procedure(s): SACROPLASTY-S1 (N/A)  SURGEON: Laurene Footman, MD  ASSISTANTS: None  ANESTHESIA:   local and MAC  EBL:  Total I/O In: 400 [I.V.:400] Out: 5 [Blood:5]  BLOOD ADMINISTERED:none  DRAINS: none   LOCAL MEDICATIONS USED:  MARCAINE    and XYLOCAINE   SPECIMEN:  No Specimen  DISPOSITION OF SPECIMEN:  N/A  COUNTS:  YES  TOURNIQUET:  * No tourniquets in log *  IMPLANTS: Bone cement  DICTATION: .Dragon Dictation patient was brought to the operating room and after adequate sedation was given she was placed prone.  C arm was brought in and good visualization of the sacrum was obtained in both AP and lateral projections.  After patient identification and timeout procedures were completed, 10 cc of 1% Xylocaine was overtreated the skin in the area of the expected incision.  The back was then prepped and draped in the usual sterile fashion and then repeat timeout procedure carried out.  A spinal needle was used to get a 50-50 mix of 1% Xylocaine, half percent Sensorcaine with epinephrine 20 cc total to each side of the sacrum in the area of the planned approach.  After allowing this to set a small incision was made first on the left the trocar was advanced between the sacral foramina and the SI joint in line with the sacrum going from distal to proximal along the long axis.  This was repeated on the right side.  The cement was mixed and then used to infiltrate into the sacrum on both sides getting good fill of both S1 and S2 which she had the edema present approximately 3 cc of cement on each side with out extravasation towards the neuroforamen or SI joint.  There was a small amount that came through a fracture line dorsally at S1.  After the cemented set the trochars removed and permanent serum  views obtained.  Dermabond was used to close the skin followed by Band-Aid.  PLAN OF CARE: Discharge to home after PACU  PATIENT DISPOSITION:  PACU - hemodynamically stable.

## 2018-11-03 NOTE — Transfer of Care (Signed)
Immediate Anesthesia Transfer of Care Note  Patient: Gabri[place]  Procedure(s) Performed: Dillard Cannon (N/A )  Patient Location: PACU  Anesthesia Type:General  Level of Consciousness: awake, alert  and oriented  Airway & Oxygen Therapy: Patient Spontanous Breathing and Patient connected to nasal cannula oxygen  Post-op Assessment: Report given to RN and Post -op Vital signs reviewed and stable  Post vital signs: Reviewed and stable  Last Vitals:  Vitals Value Taken Time  BP    Temp    Pulse    Resp 10 11/03/2018  3:05 PM  SpO2    Vitals shown include unvalidated device data.  Last Pain:  Vitals:   11/03/18 1341  TempSrc: Tympanic         Complications: No apparent anesthesia complications

## 2018-11-03 NOTE — OR Nursing (Signed)
Discharge instructions discussed with pt and daughter. Daughter voices understanding.

## 2018-11-03 NOTE — Anesthesia Preprocedure Evaluation (Signed)
Anesthesia Evaluation  Patient identified by MRN, date of birth, ID band Patient awake and Patient confused    Reviewed: Allergy & Precautions, NPO status , Patient's Chart, lab work & pertinent test results  History of Anesthesia Complications Negative for: history of anesthetic complications  Airway Mallampati: II       Dental  (+) Partial Upper, Partial Lower   Pulmonary neg sleep apnea, neg COPD,           Cardiovascular hypertension, (-) Past MI and (-) CHF (-) dysrhythmias (-) Valvular Problems/Murmurs     Neuro/Psych neg Seizures PSYCHIATRIC DISORDERS Anxiety Depression Dementia CVA (mild dementia and memory loss), Residual Symptoms    GI/Hepatic Neg liver ROS, GERD  Medicated and Controlled,  Endo/Other  neg diabetes  Renal/GU negative Renal ROS     Musculoskeletal  (+) Arthritis ,   Abdominal   Peds  Hematology   Anesthesia Other Findings   Reproductive/Obstetrics                             Anesthesia Physical  Anesthesia Plan  ASA: III  Anesthesia Plan: General   Post-op Pain Management:    Induction: Intravenous  PONV Risk Score and Plan: 3 and Ondansetron, Dexamethasone and Treatment may vary due to age or medical condition  Airway Management Planned: Simple Face Mask  Additional Equipment:   Intra-op Plan:   Post-operative Plan:   Informed Consent: I have reviewed the patients History and Physical, chart, labs and discussed the procedure including the risks, benefits and alternatives for the proposed anesthesia with the patient or authorized representative who has indicated his/her understanding and acceptance.   Dental Advisory Given  Plan Discussed with: Anesthesiologist, CRNA and Surgeon  Anesthesia Plan Comments: (Patient and daughter consented for risks of anesthesia including but not limited to:  - adverse reactions to medications - risk of intubation  if required - damage to teeth, lips or other oral mucosa - sore throat or hoarseness - Damage to heart, brain, lungs or loss of life  They voiced understanding.)        Anesthesia Quick Evaluation

## 2018-11-03 NOTE — H&P (Signed)
Reviewed paper H+P, will be scanned into chart. No changes noted.  

## 2018-11-03 NOTE — Anesthesia Post-op Follow-up Note (Signed)
Anesthesia QCDR form completed.        

## 2018-11-04 ENCOUNTER — Encounter: Payer: Self-pay | Admitting: Orthopedic Surgery

## 2018-11-05 NOTE — Anesthesia Postprocedure Evaluation (Signed)
Anesthesia Post Note  Patient: Tynisa Vohs Kiger  Procedure(s) Performed: Dillard Cannon (N/A )  Patient location during evaluation: PACU Anesthesia Type: General Level of consciousness: awake and alert Pain management: pain level controlled Vital Signs Assessment: post-procedure vital signs reviewed and stable Respiratory status: spontaneous breathing, nonlabored ventilation, respiratory function stable and patient connected to nasal cannula oxygen Cardiovascular status: blood pressure returned to baseline and stable Postop Assessment: no apparent nausea or vomiting Anesthetic complications: no     Last Vitals:  Vitals:   11/03/18 1542 11/03/18 1600  BP: (!) 161/68 (!) 164/65  Pulse: 82 82  Resp: 16 16  Temp: 36.6 C 36.6 C  SpO2: 98% 98%    Last Pain:  Vitals:   11/04/18 0856  TempSrc:   PainSc: 1                  Precious Haws Roselie Cirigliano

## 2018-11-10 ENCOUNTER — Ambulatory Visit: Payer: Medicare Other

## 2019-06-16 ENCOUNTER — Telehealth: Payer: Self-pay | Admitting: Primary Care

## 2019-06-16 NOTE — Telephone Encounter (Signed)
Spoke with patient's daughter [name] to schedule Palliative consult.  Daughter stated that patient was leaving tomorrow morning to go stay with son for a couple of weeks in [place].  Daughter is also supposed to have surgery herself sometime soon.  Daughter has MD appointment on 7/22 and she will find out at that appointment when she is to have surgery.  Daughter will call me back after appointment and we will discuss when to schedule the Palliative consult at that time.

## 2019-06-26 ENCOUNTER — Telehealth: Payer: Self-pay | Admitting: Primary Care

## 2019-06-26 NOTE — Telephone Encounter (Signed)
Contacted patient's daughter [name] to follow-up on previous conversation.  After a lengthy discussion with daughter we have scheduled a Palliative Telephone Consult for 07/19/19 @ 10 AM.  Patient will be going to stay with son in [place], [place] for a week and will be back around 8/15.

## 2019-07-18 ENCOUNTER — Other Ambulatory Visit: Payer: Self-pay

## 2019-07-18 ENCOUNTER — Ambulatory Visit: Payer: Medicare Other | Admitting: Urology

## 2019-07-18 ENCOUNTER — Encounter: Payer: Self-pay | Admitting: Urology

## 2019-07-18 VITALS — BP 152/75 | HR 99 | Ht 62.0 in | Wt 108.0 lb

## 2019-07-18 DIAGNOSIS — C679 Malignant neoplasm of bladder, unspecified: Secondary | ICD-10-CM

## 2019-07-18 LAB — URINALYSIS, COMPLETE
Bilirubin, UA: NEGATIVE
Glucose, UA: NEGATIVE
Ketones, UA: NEGATIVE
Nitrite, UA: NEGATIVE
Specific Gravity, UA: >1.03 — ABNORMAL HIGH (ref 1.005–1.030)
Urobilinogen, Ur: 0.2 mg/dL (ref 0.2–1.0)
pH, UA: 5 (ref 5.0–7.5)

## 2019-07-18 LAB — MICROSCOPIC EXAMINATION: WBC, UA: >30 /hpf — AB (ref 0–5)

## 2019-07-18 NOTE — Progress Notes (Signed)
1:05 PM  07/18/19   Gabrielle [place] 02-07-1933 [id number]  Referring provider: Sofie Hartigan, [place],  [place] [address]  Chief Complaint  Patient presents with  . Cysto    HPI: 85 yo F with history of bladder cancer first dx on 12/2013. 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 She was counseled to undergo BCG, but however, due to national back order, she underwent mitomycin x 3.  Once available, she completed BCG x 6 from 11/23-12/28/15.   She returned to the OR on 04/23/2015 after some nonspecific patchy erythema was identified on surveillance cystoscopy in the setting of atypical urine cytology. Biopsy results were negative for any malignancy (BCG granuloma).  Most recent upper tract imaging in the form of renal ultrasound in 11/2016. She had about 1.9 cm right simple renal cyst otherwise her kidneys were unremarkable. There is a questionable small focal lesion of the right posterior bladder wall which was not seen cystoscopically.   Her dementia continues to progress and her daughter-in-law indicate that she may need to be placed in an assisted living facility in the near future.  UA today is mildly suspicious for infection although she has no urinary complaints.  Physical Exam: BP (!) 152/75   Pulse 99   Ht 5\' 2"  (1.575 m)   Wt 108 lb (49 kg)   BMI 19.75 kg/m   Constitutional:  Alert and oriented, No acute distress.  Obtained by daughter by daughter  HEENT: [place] AT, moist mucus membranes.  Trachea midline, no masses. Cardiovascular: No clubbing, cyanosis, or edema. Respiratory: Normal respiratory effort, no increased work of breathing. GI: Abdomen is soft, nontender, nondistended, no abdominal masses GU: No CVA tenderness. Normal external female genitalia, normal urethral meatus. Skin: No rashes, bruises  or suspicious lesions. Neurologic: Grossly intact, no focal deficits, moving all 4 extremities.  Severe memory impairment noted. Psychiatric: Normal mood and affect.  Laboratory Data: Lab Results  Component Value Date   WBC 4.5 11/02/2018   HGB 12.3 11/02/2018   HCT 39.1 11/02/2018   MCV 95.4 11/02/2018   PLT 378 11/02/2018    Lab Results  Component Value Date   CREATININE 0.69 11/02/2018    Urinalysis UA reviewed, see epic  Cystoscopy Procedure Note  Patient identification was confirmed, informed consent was obtained, and patient was prepped using Betadine solution.  Lidocaine jelly was administered per urethral meatus.    Preoperative abx where received prior to procedure.    Procedure: - Flexible cystoscope introduced, without any difficulty.   - Thorough search of the bladder revealed:    normal urethral meatus    Mild diffuse erythema consistent with probable cystitis, nonfocal, no papillary change.    no stones    no ulcers     no tumors    no urethral polyps    no trabeculation  - Ureteral orifices were normal in position and appearance.  Post-Procedure: - Patient tolerated the procedure well   Assessment & Plan:    1. 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  Most recent bx 03/2015 c/w BCG granuloma, no recurrence.  Consistent with probable cystitis, will send urine cytology to rule out underlying pathology, sitter biopsy only if urine cytology is negative Urine  culture sent today - Urinalysis, Complete - lidocaine (XYLOCAINE) 2 % jelly 1 application; Place 1 application into the urethra once. - ciprofloxacin (CIPRO) tablet 500 mg; Take 1 tablet (500 mg total) by mouth once.   Return in about 1 year (around 07/17/2020) for cysto.  Hollice Espy, MD  [facility] [address] [place], [place] [address]  [phone]

## 2019-07-19 ENCOUNTER — Other Ambulatory Visit: Payer: Medicare Other | Admitting: Primary Care

## 2019-07-19 DIAGNOSIS — Z515 Encounter for palliative care: Secondary | ICD-10-CM

## 2019-07-19 NOTE — Progress Notes (Signed)
[facility] [facility] Palliative Care Consult Note Telephone: [phone]  Fax: [phone]  TELEHEALTH VISIT STATEMENT Due to the COVID-19 crisis, this visit was done via telemedicine from my office. It was initiated and consented to by this patient and/or family.  PATIENT NAME: Gabrielle [place] DOB: 09/04/1933 MRN: [id number]  PRIMARY CARE PROVIDER:  Toni Arthurs, NP [address] [place] [address] [phone]  REFERRING PROVIDER:Leach, Larene Beach, NP [place],  [place] [address]   RESPONSIBLE PARTY:   Extended Emergency Contact Information Primary Emergency Contact: [facility] Address: [address]          [place], [place] [address] Johnnette Litter of [place] Phone: [phone] Mobile Phone: [phone] Relation: Daughter   ASSESSMENT AND RECOMMENDATIONS:   1. Advance Care Planning/Goals of Care: T/c for telemedicine at appointment time, daughter states she is going out to take patient to MD. Appears to have forgotten Palliative appointment but has a few minutes for intake. Mother has been with her for 3 years, since the passing of her father. She is now progressing in her dementia to resist care especially toileting, and daughter and her husband also have health issues which preclude this level of care giving.  She has found a bed at [facility]. Toni Arthurs NP will be continue to be PCP. I will continue to visit if amenable to family care home, and continue goals of care discussion there.   2. Symptom Management:   Combative behavior: Has been escalating since 2017 when she lost her husband and had to come to stay with daughter. She resists toileting and other dressing activities. We discussed behavior redirection as well as medications if needed to help ameliorate the behavior. Daughter is afraid she will be dismissed from the care home. We discussed this concern.  Dementia: Discussed course of illness and care  giver coping mechanisms. Discussed the care she expects in care home, she is hopeful her mother will enjoy the company and home feeling environment. Education provided RE the stages and expected behaviors associated with progressive disease.  3. Family /Caregiver/Community Supports: Moving to family care home next week. Palliative care can continue to visit there. Daughter gave verbal consent for palliative medicine, and to follow at care home.  4. Cognitive / Functional decline: Increasing in combative behavior. Needs assistance with all IADLs and with dressing, toileting. Can participate in feeding herself.  5. Follow up Palliative Care Visit: Palliative care will continue to follow for goals of care clarification and symptom management. Return 2-3 weeks after she is placed, or prn.  I spent 20 minutes providing this consultation,  from 1015 to 1035. More than 50% of the time in this consultation was spent coordinating communication.   HISTORY OF PRESENT ILLNESS:  Gabrielle [place] is a 85 y.o. year old female with multiple medical problems including Alzheimer's and vascular dementia, wandering, CVA. Palliative Care was asked to follow this patient by consultation request of Toni Arthurs, NP to help address advance care planning and goals of care. This is the initial visit.  CODE STATUS: TBD  PPS: 40% HOSPICE ELIGIBILITY/DIAGNOSIS: TBD  PAST MEDICAL HISTORY:  Past Medical History:  Diagnosis Date  . Arthritis    hands  . Bladder cancer ([place])   . Brain bleed ([place]) 2019   d/t light strokes, high bp, seizures. brain has shrunk per dr. Manuella Ghazi and mri  . Cancer ([place])    BLADDER X 2  . Dementia ([place]) 2019  dementia progressing  . Depression   . GERD (gastroesophageal reflux disease)    chokes easily even on own saliva and then has panic attack  . Hyperlipidemia   . Hypertension 2019   no medications.  no htn per daughter  . Seizures [facility])    family not aware of any seizures and no  treatment  . Stroke [facility]) 2019   undiagnosed but per neuro, she has had them causing vascular dementia  . Wears dentures    partial upper and lower    SOCIAL HX:  Social History   Tobacco Use  . Smoking status: Never Smoker  . Smokeless tobacco: Never Used  Substance Use Topics  . Alcohol use: Never    Alcohol/week: 0.0 standard drinks    Frequency: Never    ALLERGIES:  Allergies  Allergen Reactions  . Atenolol Other (See Comments)    Bradycardia   . Donepezil Hcl Other (See Comments)    Mental changes, "made me crazy"     PERTINENT MEDICATIONS:  Outpatient Encounter Medications as of 07/19/2019  Medication Sig  . acetaminophen (TYLENOL) 500 MG tablet Take 1,000 mg by mouth 2 (two) times daily.   Marland Kitchen aspirin EC 81 MG tablet Take 81 mg by mouth daily.  . Cholecalciferol (VITAMIN D3) 50 MCG (2000 UT) capsule Take 3 capsules daily for 3 months, then reduce to 1 capsule daily thereafter for Vitamin D Deficiency.  . citalopram (CELEXA) 20 MG tablet Take 20 mg by mouth daily.   Marland Kitchen docusate sodium (COLACE) 50 MG capsule Take 50 mg by mouth 2 (two) times daily as needed for mild constipation.  . lovastatin (MEVACOR) 20 MG tablet Take 20 mg by mouth at bedtime.   . Magnesium 250 MG TABS Take 250 mg by mouth daily.   . memantine (NAMENDA) 10 MG tablet Take 10 mg by mouth 2 (two) times daily.   Marland Kitchen oxyCODONE (ROXICODONE) 5 MG immediate release tablet Take 1 tablet (5 mg total) by mouth every 8 (eight) hours as needed. (Patient taking differently: Take 5 mg by mouth every 4 (four) hours as needed for moderate pain. )  . oxyCODONE (ROXICODONE) 5 MG immediate release tablet Take 1 tablet (5 mg total) by mouth every 8 (eight) hours as needed.  Marland Kitchen QUEtiapine (SEROQUEL) 25 MG tablet Take by mouth.   No facility-administered encounter medications on file as of 07/19/2019.     PHYSICAL EXAM / ROS:   Current and past weights: Current wt unavailable, 09/2018 115 lbs. General: NAD, wandering  behaviors, combative behaviors interfering with care giving. Cardiovascular: no chest pain reported Pulmonary: no cough reported GU: denies dysuria, incontinent of urine at times MSK:  no joint deformities, ambulatory, wanders, frequent falls Skin: no rashes or wounds reported Neurological: Weakness, AD and vascular since 2013. Has had her since 2017. FAST score 6E.  Cyndia Skeeters DNP AGPCNP-BC

## 2019-07-21 ENCOUNTER — Other Ambulatory Visit: Payer: Self-pay | Admitting: Urology

## 2019-07-21 LAB — CULTURE, URINE COMPREHENSIVE

## 2019-07-24 ENCOUNTER — Telehealth: Payer: Self-pay

## 2019-07-24 DIAGNOSIS — B964 Proteus (mirabilis) (morganii) as the cause of diseases classified elsewhere: Secondary | ICD-10-CM

## 2019-07-24 DIAGNOSIS — N390 Urinary tract infection, site not specified: Secondary | ICD-10-CM

## 2019-07-24 MED ORDER — SULFAMETHOXAZOLE-TRIMETHOPRIM 800-160 MG PO TABS: 1.0000 | ORAL_TABLET | Freq: Two times a day (BID) | ORAL | 0 refills | Status: DC

## 2019-07-24 NOTE — Telephone Encounter (Signed)
Called pt's daughter informed her of the information below. Daughter gave verbal understanding. RX sent in, daughter declines making appt at this time as she is in the process of placing pt in a nursing facility. She states she will call back to make appt.

## 2019-07-24 NOTE — Telephone Encounter (Signed)
-----   Message from Hollice Espy, MD sent at 07/24/2019  8:20 AM EDT ----- Please let this patient/her daughter know that we sent her urine off for culture.  It grew out Proteus which is a bacteria that is often found in the urine but given her worsening mental status, could be making her symptoms worse.  As such, it be reasonable to go and treat her for this.  Please treated with Bactrim DS twice daily for 7 days.  Also, she was supposed to make a follow-up for cystoscopy in 1 year.  It does not look like this appointment got made.  Recommend scheduling this.  Hollice Espy, MD

## 2019-07-25 ENCOUNTER — Inpatient Hospital Stay
Admission: EM | Admit: 2019-07-25 | Discharge: 2019-07-28 | DRG: 066 | Disposition: A | Discharge status: Skilled Nursing Facility | Payer: Medicare Other | Attending: Internal Medicine | Admitting: Internal Medicine

## 2019-07-25 ENCOUNTER — Other Ambulatory Visit: Payer: Self-pay

## 2019-07-25 ENCOUNTER — Encounter: Payer: Self-pay | Admitting: Radiology

## 2019-07-25 ENCOUNTER — Emergency Department: Payer: Medicare Other

## 2019-07-25 DIAGNOSIS — Z8551 Personal history of malignant neoplasm of bladder: Secondary | ICD-10-CM | POA: Diagnosis not present

## 2019-07-25 DIAGNOSIS — I639 Cerebral infarction, unspecified: Principal | ICD-10-CM | POA: Diagnosis present

## 2019-07-25 DIAGNOSIS — F329 Major depressive disorder, single episode, unspecified: Secondary | ICD-10-CM | POA: Diagnosis present

## 2019-07-25 DIAGNOSIS — Z7982 Long term (current) use of aspirin: Secondary | ICD-10-CM

## 2019-07-25 DIAGNOSIS — Z20828 Contact with and (suspected) exposure to other viral communicable diseases: Secondary | ICD-10-CM | POA: Diagnosis present

## 2019-07-25 DIAGNOSIS — F0280 Dementia in other diseases classified elsewhere without behavioral disturbance: Secondary | ICD-10-CM | POA: Diagnosis present

## 2019-07-25 DIAGNOSIS — K219 Gastro-esophageal reflux disease without esophagitis: Secondary | ICD-10-CM | POA: Diagnosis present

## 2019-07-25 DIAGNOSIS — I10 Essential (primary) hypertension: Secondary | ICD-10-CM | POA: Diagnosis present

## 2019-07-25 DIAGNOSIS — Z972 Presence of dental prosthetic device (complete) (partial): Secondary | ICD-10-CM

## 2019-07-25 DIAGNOSIS — Z87440 Personal history of urinary (tract) infections: Secondary | ICD-10-CM | POA: Diagnosis not present

## 2019-07-25 DIAGNOSIS — R402362 Coma scale, best motor response, obeys commands, at arrival to emergency department: Secondary | ICD-10-CM | POA: Diagnosis present

## 2019-07-25 DIAGNOSIS — I6389 Other cerebral infarction: Secondary | ICD-10-CM | POA: Diagnosis not present

## 2019-07-25 DIAGNOSIS — E785 Hyperlipidemia, unspecified: Secondary | ICD-10-CM | POA: Diagnosis present

## 2019-07-25 DIAGNOSIS — R29717 NIHSS score 17: Secondary | ICD-10-CM | POA: Diagnosis present

## 2019-07-25 DIAGNOSIS — Z79899 Other long term (current) drug therapy: Secondary | ICD-10-CM

## 2019-07-25 DIAGNOSIS — Z9079 Acquired absence of other genital organ(s): Secondary | ICD-10-CM

## 2019-07-25 DIAGNOSIS — F0150 Vascular dementia without behavioral disturbance: Secondary | ICD-10-CM | POA: Diagnosis present

## 2019-07-25 DIAGNOSIS — R29810 Facial weakness: Secondary | ICD-10-CM | POA: Diagnosis present

## 2019-07-25 DIAGNOSIS — Z888 Allergy status to other drugs, medicaments and biological substances status: Secondary | ICD-10-CM | POA: Diagnosis not present

## 2019-07-25 DIAGNOSIS — R402232 Coma scale, best verbal response, inappropriate words, at arrival to emergency department: Secondary | ICD-10-CM | POA: Diagnosis present

## 2019-07-25 DIAGNOSIS — M19042 Primary osteoarthritis, left hand: Secondary | ICD-10-CM | POA: Diagnosis present

## 2019-07-25 DIAGNOSIS — G309 Alzheimer's disease, unspecified: Secondary | ICD-10-CM | POA: Diagnosis present

## 2019-07-25 DIAGNOSIS — M19041 Primary osteoarthritis, right hand: Secondary | ICD-10-CM | POA: Diagnosis present

## 2019-07-25 DIAGNOSIS — Z8673 Personal history of transient ischemic attack (TIA), and cerebral infarction without residual deficits: Secondary | ICD-10-CM | POA: Diagnosis not present

## 2019-07-25 DIAGNOSIS — R4702 Dysphasia: Secondary | ICD-10-CM | POA: Diagnosis present

## 2019-07-25 DIAGNOSIS — Z9049 Acquired absence of other specified parts of digestive tract: Secondary | ICD-10-CM

## 2019-07-25 DIAGNOSIS — Z90710 Acquired absence of both cervix and uterus: Secondary | ICD-10-CM

## 2019-07-25 DIAGNOSIS — R402142 Coma scale, eyes open, spontaneous, at arrival to emergency department: Secondary | ICD-10-CM | POA: Diagnosis present

## 2019-07-25 LAB — DIFFERENTIAL
Abs Immature Granulocytes: 0.02 10*3/uL (ref 0.00–0.07)
Basophils Absolute: 0 10*3/uL (ref 0.0–0.1)
Basophils Relative: 0 %
Eosinophils Absolute: 0.1 10*3/uL (ref 0.0–0.5)
Eosinophils Relative: 2 %
Immature Granulocytes: 0 %
Lymphocytes Relative: 34 %
Lymphs Abs: 2.1 10*3/uL (ref 0.7–4.0)
Monocytes Absolute: 0.7 10*3/uL (ref 0.1–1.0)
Monocytes Relative: 11 %
Neutro Abs: 3.3 10*3/uL (ref 1.7–7.7)
Neutrophils Relative %: 53 %

## 2019-07-25 LAB — COMPREHENSIVE METABOLIC PANEL
ALT: 12 U/L (ref 0–44)
AST: 19 U/L (ref 15–41)
Albumin: 4.2 g/dL (ref 3.5–5.0)
Alkaline Phosphatase: 71 U/L (ref 38–126)
Anion gap: 9 (ref 5–15)
BUN: 14 mg/dL (ref 8–23)
CO2: 25 mmol/L (ref 22–32)
Calcium: 9.2 mg/dL (ref 8.9–10.3)
Chloride: 105 mmol/L (ref 98–111)
Creatinine, Ser: 0.84 mg/dL (ref 0.44–1.00)
GFR calc Af Amer: >60 mL/min (ref >60)
GFR calc non Af Amer: >60 mL/min (ref >60)
Glucose, Bld: 105 mg/dL — ABNORMAL HIGH (ref 70–99)
Potassium: 3.9 mmol/L (ref 3.5–5.1)
Sodium: 139 mmol/L (ref 135–145)
Total Bilirubin: 0.6 mg/dL (ref 0.3–1.2)
Total Protein: 7.7 g/dL (ref 6.5–8.1)

## 2019-07-25 LAB — CBC
HCT: 42.1 % (ref 36.0–46.0)
Hemoglobin: 13.6 g/dL (ref 12.0–15.0)
MCH: 30.8 pg (ref 26.0–34.0)
MCHC: 32.3 g/dL (ref 30.0–36.0)
MCV: 95.5 fL (ref 80.0–100.0)
Platelets: 260 10*3/uL (ref 150–400)
RBC: 4.41 MIL/uL (ref 3.87–5.11)
RDW: 12.9 % (ref 11.5–15.5)
WBC: 6.3 10*3/uL (ref 4.0–10.5)
nRBC: 0 % (ref 0.0–0.2)

## 2019-07-25 LAB — APTT: aPTT: 34 seconds (ref 24–36)

## 2019-07-25 LAB — GLUCOSE, CAPILLARY
Glucose-Capillary: 98 mg/dL (ref 70–99)
Glucose-Capillary: 98 mg/dL (ref 70–99)

## 2019-07-25 LAB — PROTIME-INR
INR: 0.9 (ref 0.8–1.2)
Prothrombin Time: 12.4 seconds (ref 11.4–15.2)

## 2019-07-25 MED ORDER — ENOXAPARIN SODIUM 40 MG/0.4ML ~~LOC~~ SOLN
40.0000 mg | SUBCUTANEOUS | Status: DC
  Administered 2019-07-26 – 2019-07-28 (×3): 40 mg via SUBCUTANEOUS
  Filled 2019-07-25 (×3): qty 0.4

## 2019-07-25 MED ORDER — STROKE: EARLY STAGES OF RECOVERY BOOK
Freq: Once | Status: AC
  Administered 2019-07-26: 01:00:00

## 2019-07-25 MED ORDER — ACETAMINOPHEN 650 MG RE SUPP: 650.0000 mg | RECTAL | Status: DC | PRN

## 2019-07-25 MED ORDER — ACETAMINOPHEN 160 MG/5ML PO SOLN
650.0000 mg | ORAL | Status: DC | PRN
  Filled 2019-07-25: qty 20.3

## 2019-07-25 MED ORDER — ACETAMINOPHEN 325 MG PO TABS: 650.0000 mg | ORAL_TABLET | ORAL | Status: DC | PRN

## 2019-07-25 MED ORDER — SODIUM CHLORIDE 0.9% FLUSH: 3.0000 mL | Freq: Once | INTRAVENOUS | Status: DC

## 2019-07-25 MED ORDER — IOHEXOL 350 MG/ML SOLN
100.0000 mL | Freq: Once | INTRAVENOUS | Status: AC | PRN
  Administered 2019-07-25: 100 mL via INTRAVENOUS

## 2019-07-25 NOTE — ED Notes (Signed)
ED TO INPATIENT HANDOFF REPORT  ED Nurse Name and Phone #:  Quillian Quince [place] Name/Age/Gender Gabrielle Crosby 85 y.o. female Room/Bed: ED13A/ED13A  Code Status   Code Status: Prior  Home/SNF/Other Home Patient oriented to: self Is this baseline? Yes   Triage Complete: Triage complete  Chief Complaint TIA symptoms  Triage Note Arrives with daughter.  Patient has history of vascular dementia and alzheimer's dementia.  Patient was last seen at her baseline at 1700.  Daughter noticed left facial droop, change of speech, and difficulty swallowing at around 1800.  At approximately 1745hrs, the patient's daughter observed the patient beginning to talk in a whisper, keeping her head to the right, and her mouth starting to "draw". Patient has a history of brain bleeds.   Allergies Allergies  Allergen Reactions  . Atenolol Other (See Comments)    Bradycardia   . Donepezil Hcl Other (See Comments)    Mental changes, "made me crazy"    Level of Care/Admitting Diagnosis ED Disposition    ED Disposition Condition [facility] Area: [place] [[id number]]  Level of Care: Med-Surg [16]  Covid Evaluation: Asymptomatic Screening Protocol (No Symptoms)  Diagnosis: Facial droop [id number]  Admitting Physician: Lance Coon [id number]  Attending Physician: Jannifer Franklin, DAVID [phone]  Estimated length of stay: past midnight tomorrow  Certification:: I certify this patient will need inpatient services for at least 2 midnights  PT Class (Do Not Modify): Inpatient [101]  PT Acc Code (Do Not Modify): Private [1]       B Medical/Surgery History Past Medical History:  Diagnosis Date  . Arthritis    hands  . Bladder cancer ([place])   . Brain bleed ([place]) 2019   d/t light strokes, high bp, seizures. brain has shrunk per dr. Manuella Ghazi and mri  . Cancer ([place])    BLADDER X 2  . Dementia ([place]) 2019   dementia progressing  . Depression   . GERD  (gastroesophageal reflux disease)    chokes easily even on own saliva and then has panic attack  . Hyperlipidemia   . Hypertension 2019   no medications.  no htn per daughter  . Seizures [facility])    family not aware of any seizures and no treatment  . Stroke [facility]) 2019   undiagnosed but per neuro, she has had them causing vascular dementia  . Wears dentures    partial upper and lower   Past Surgical History:  Procedure Laterality Date  . ABDOMINAL HYSTERECTOMY Bilateral    BILATERAL s&o  . BACK SURGERY    . BLADDER SURGERY N/A 2015   REMOVAL OF BLADDER CANCER X 2  . BOWEL RESECTION N/A 1999   SMALL BOWEL OBSTRUCTION  . CHOLECYSTECTOMY    . COLONOSCOPY N/A 11/06/2015   Procedure: COLONOSCOPY;  Surgeon: Hulen Luster, MD;  Location: [place];  Service: Gastroenterology;  Laterality: N/A;  . CYSTOSCOPY WITH BIOPSY N/A 04/17/2015   Procedure: CYSTOSCOPY WITH BIOPSY;  Surgeon: Hollice Espy, MD;  Location: [facility];  Service: Urology;  Laterality: N/A;  . KYPHOPLASTY N/A 12/20/2017   Procedure: [id number];  Surgeon: Hessie Knows, MD;  Location: [facility];  Service: Orthopedics;  Laterality: N/A;  . KYPHOPLASTY N/A 04/21/2018   Procedure: KYPHOPLASTY-L2,L3,L4,L5;  Surgeon: Hessie Knows, MD;  Location: [facility];  Service: Orthopedics;  Laterality: N/A;  . SACROPLASTY N/A 11/03/2018   Procedure: Dillard Cannon;  Surgeon: Hessie Knows, MD;  Location: [facility];  Service: Orthopedics;  Laterality: N/A;     A IV Location/Drains/Wounds Patient Lines/Drains/Airways Status   Active Line/Drains/Airways    Name:   Placement date:   Placement time:   Site:   Days:   Peripheral IV 07/25/19 Left Antecubital   07/25/19    1920    Antecubital   less than 1   Airway   12/20/17    1100     582   Incision (Closed) 04/17/15 Vagina   04/17/15    1431     1560   Incision (Closed) 12/20/17 Back   12/20/17    1216     582   Incision (Closed) 04/21/18 Back   04/21/18    1127     460   Incision  (Closed) 11/03/18 Back   11/03/18    1450     264          Intake/Output Last 24 hours No intake or output data in the 24 hours ending 07/25/19 2235  Labs/Imaging Results for orders placed or performed during the hospital encounter of 07/25/19 (from the past 48 hour(s))  Glucose, capillary     Status: None   Collection Time: 07/25/19  7:02 PM  Result Value Ref Range   Glucose-Capillary 98 70 - 99 mg/dL  Protime-INR     Status: None   Collection Time: 07/25/19  7:22 PM  Result Value Ref Range   Prothrombin Time 12.4 11.4 - 15.2 seconds   INR 0.9 0.8 - 1.2    Comment: (NOTE) INR goal varies based on device and disease states. Performed at [facility], [address] [place], [place] [address]   APTT     Status: None   Collection Time: 07/25/19  7:22 PM  Result Value Ref Range   aPTT 34 24 - 36 seconds    Comment: Performed at [facility], [address] [place], [place] [address]  CBC     Status: None   Collection Time: 07/25/19  7:22 PM  Result Value Ref Range   WBC 6.3 4.0 - 10.5 K/uL   RBC 4.41 3.87 - 5.11 MIL/uL   Hemoglobin 13.6 12.0 - 15.0 g/dL   HCT 42.1 36.0 - 46.0 %   MCV 95.5 80.0 - 100.0 fL   MCH 30.8 26.0 - 34.0 pg   MCHC 32.3 30.0 - 36.0 g/dL   RDW 12.9 11.5 - 15.5 %   Platelets 260 150 - 400 K/uL   nRBC 0.0 0.0 - 0.2 %    Comment: Performed at [facility], [address] [place], [place] [address]  Differential     Status: None   Collection Time: 07/25/19  7:22 PM  Result Value Ref Range   Neutrophils Relative % 53 %   Neutro Abs 3.3 1.7 - 7.7 K/uL   Lymphocytes Relative 34 %   Lymphs Abs 2.1 0.7 - 4.0 K/uL   Monocytes Relative 11 %   Monocytes Absolute 0.7 0.1 - 1.0 K/uL   Eosinophils Relative 2 %   Eosinophils Absolute 0.1 0.0 - 0.5 K/uL   Basophils Relative 0 %   Basophils Absolute 0.0 0.0 - 0.1 K/uL   Immature Granulocytes 0 %   Abs Immature Granulocytes 0.02 0.00 - 0.07 K/uL    Comment: Performed at  [facility], [address] [place], [place] [address]  Comprehensive metabolic panel     Status: Abnormal   Collection Time: 07/25/19  7:22 PM  Result Value Ref Range  Sodium 139 135 - 145 mmol/L   Potassium 3.9 3.5 - 5.1 mmol/L   Chloride 105 98 - 111 mmol/L   CO2 25 22 - 32 mmol/L   Glucose, Bld 105 (H) 70 - 99 mg/dL   BUN 14 8 - 23 mg/dL   Creatinine, Ser 0.84 0.44 - 1.00 mg/dL   Calcium 9.2 8.9 - 10.3 mg/dL   Total Protein 7.7 6.5 - 8.1 g/dL   Albumin 4.2 3.5 - 5.0 g/dL   AST 19 15 - 41 U/L   ALT 12 0 - 44 U/L   Alkaline Phosphatase 71 38 - 126 U/L   Total Bilirubin 0.6 0.3 - 1.2 mg/dL   GFR calc non Af Amer >60 >60 mL/min   GFR calc Af Amer >60 >60 mL/min   Anion gap 9 5 - 15    Comment: Performed at [facility], [address] [place], [place] [address]  Glucose, capillary     Status: None   Collection Time: 07/25/19  7:28 PM  Result Value Ref Range   Glucose-Capillary 98 70 - 99 mg/dL   Ct Angio Head W Or Wo Contrast  Result Date: 07/25/2019 CLINICAL DATA:  Vascular dementia and Alzheimer's dementia. Left facial droop. Difficulty swallowing. Symptoms began 1800 hours EXAM: CT ANGIOGRAPHY HEAD AND NECK CT PERFUSION BRAIN TECHNIQUE: Multidetector CT imaging of the head and neck was performed using the standard protocol during bolus administration of intravenous contrast. Multiplanar CT image reconstructions and MIPs were obtained to evaluate the vascular anatomy. Carotid stenosis measurements (when applicable) are obtained utilizing NASCET criteria, using the distal internal carotid diameter as the denominator. Multiphase CT imaging of the brain was performed following IV bolus contrast injection. Subsequent parametric perfusion maps were calculated using RAPID software. CONTRAST:  151mL OMNIPAQUE IOHEXOL 350 MG/ML SOLN COMPARISON:  Head CT earlier same day.  MRI 01/14/2018 FINDINGS: CTA NECK FINDINGS Aortic arch: Aortic atherosclerosis. No aneurysm or  dissection. Branching pattern is normal. Right carotid system: Common carotid artery widely patent to the bifurcation region. Calcified plaque at the carotid bifurcation and ICA bulb but no stenosis. Cervical ICA widely patent to the upper cervical region. There is irregularity of the vessel at the C1 level consistent with fibromuscular change. Left carotid system: Common carotid artery widely patent to the bifurcation. Calcified plaque at the carotid bifurcation. No stenosis. Cervical ICA is widely patent, with some irregularity in the upper cervical region related to fibromuscular change. Vertebral arteries: No vertebral origin stenosis. Both vertebral arteries appear widely patent through the cervical region to the C1 level, where there is mild irregularity consistent with fibromuscular disease. Skeleton: Ordinary cervical spondylosis. Other neck: No mass or adenopathy. Upper chest: Mild emphysema and pulmonary scarring. Review of the MIP images confirms the above findings CTA HEAD FINDINGS Anterior circulation: Both internal carotid arteries are patent through the skull base and siphon regions. The anterior and middle cerebral vessels do not show proximal stenosis or occlusion. There is no M1 or M2 stenosis. I can not identify a missing M3 branch on either side. Posterior circulation: Both vertebral arteries are widely patent to the basilar. No basilar stenosis. Posterior circulation branch vessels are patent. Venous sinuses: Patent and normal. Anatomic variants: None significant. Review of the MIP images confirms the above findings CT Brain Perfusion Findings: ASPECTS: 10 CBF (<30%) Volume: 96mL Perfusion (Tmax>6.0s) volume: 79mL Mismatch Volume: 38mL Infarction Location:No infarction identified. Right frontoparietal junction region changes consistent with ischemia in a right M3 branch distribution. IMPRESSION: Ordinary  atherosclerosis of the arch and neck vessels without stenosis. Irregularity of the internal  carotid arteries and vertebral arteries in the upper neck consistent with fibromuscular disease but without evidence of flow limiting stenosis. No infarction demonstrated by perfusion study. Ischemic change in the right frontoparietal junction region consistent with right M3 branch vessel insult. I can not specifically identify that by CT angiography. Volume measures 8 cc. These results were called by telephone at the time of interpretation on 07/25/2019 at 8:31 pm to Dr. Jimmye Norman, who verbally acknowledged these results. Electronically Signed   By: Nelson Chimes M.D.   On: 07/25/2019 20:34   Ct Angio Neck W Or Wo Contrast  Result Date: 07/25/2019 CLINICAL DATA:  Vascular dementia and Alzheimer's dementia. Left facial droop. Difficulty swallowing. Symptoms began 1800 hours EXAM: CT ANGIOGRAPHY HEAD AND NECK CT PERFUSION BRAIN TECHNIQUE: Multidetector CT imaging of the head and neck was performed using the standard protocol during bolus administration of intravenous contrast. Multiplanar CT image reconstructions and MIPs were obtained to evaluate the vascular anatomy. Carotid stenosis measurements (when applicable) are obtained utilizing NASCET criteria, using the distal internal carotid diameter as the denominator. Multiphase CT imaging of the brain was performed following IV bolus contrast injection. Subsequent parametric perfusion maps were calculated using RAPID software. CONTRAST:  114mL OMNIPAQUE IOHEXOL 350 MG/ML SOLN COMPARISON:  Head CT earlier same day.  MRI 01/14/2018 FINDINGS: CTA NECK FINDINGS Aortic arch: Aortic atherosclerosis. No aneurysm or dissection. Branching pattern is normal. Right carotid system: Common carotid artery widely patent to the bifurcation region. Calcified plaque at the carotid bifurcation and ICA bulb but no stenosis. Cervical ICA widely patent to the upper cervical region. There is irregularity of the vessel at the C1 level consistent with fibromuscular change. Left carotid  system: Common carotid artery widely patent to the bifurcation. Calcified plaque at the carotid bifurcation. No stenosis. Cervical ICA is widely patent, with some irregularity in the upper cervical region related to fibromuscular change. Vertebral arteries: No vertebral origin stenosis. Both vertebral arteries appear widely patent through the cervical region to the C1 level, where there is mild irregularity consistent with fibromuscular disease. Skeleton: Ordinary cervical spondylosis. Other neck: No mass or adenopathy. Upper chest: Mild emphysema and pulmonary scarring. Review of the MIP images confirms the above findings CTA HEAD FINDINGS Anterior circulation: Both internal carotid arteries are patent through the skull base and siphon regions. The anterior and middle cerebral vessels do not show proximal stenosis or occlusion. There is no M1 or M2 stenosis. I can not identify a missing M3 branch on either side. Posterior circulation: Both vertebral arteries are widely patent to the basilar. No basilar stenosis. Posterior circulation branch vessels are patent. Venous sinuses: Patent and normal. Anatomic variants: None significant. Review of the MIP images confirms the above findings CT Brain Perfusion Findings: ASPECTS: 10 CBF (<30%) Volume: 80mL Perfusion (Tmax>6.0s) volume: 24mL Mismatch Volume: 35mL Infarction Location:No infarction identified. Right frontoparietal junction region changes consistent with ischemia in a right M3 branch distribution. IMPRESSION: Ordinary atherosclerosis of the arch and neck vessels without stenosis. Irregularity of the internal carotid arteries and vertebral arteries in the upper neck consistent with fibromuscular disease but without evidence of flow limiting stenosis. No infarction demonstrated by perfusion study. Ischemic change in the right frontoparietal junction region consistent with right M3 branch vessel insult. I can not specifically identify that by CT angiography. Volume  measures 8 cc. These results were called by telephone at the time of interpretation on 07/25/2019 at 8:31  pm to Dr. Jimmye Norman, who verbally acknowledged these results. Electronically Signed   By: Nelson Chimes M.D.   On: 07/25/2019 20:34   Ct Cerebral Perfusion W Contrast  Result Date: 07/25/2019 CLINICAL DATA:  Vascular dementia and Alzheimer's dementia. Left facial droop. Difficulty swallowing. Symptoms began 1800 hours EXAM: CT ANGIOGRAPHY HEAD AND NECK CT PERFUSION BRAIN TECHNIQUE: Multidetector CT imaging of the head and neck was performed using the standard protocol during bolus administration of intravenous contrast. Multiplanar CT image reconstructions and MIPs were obtained to evaluate the vascular anatomy. Carotid stenosis measurements (when applicable) are obtained utilizing NASCET criteria, using the distal internal carotid diameter as the denominator. Multiphase CT imaging of the brain was performed following IV bolus contrast injection. Subsequent parametric perfusion maps were calculated using RAPID software. CONTRAST:  177mL OMNIPAQUE IOHEXOL 350 MG/ML SOLN COMPARISON:  Head CT earlier same day.  MRI 01/14/2018 FINDINGS: CTA NECK FINDINGS Aortic arch: Aortic atherosclerosis. No aneurysm or dissection. Branching pattern is normal. Right carotid system: Common carotid artery widely patent to the bifurcation region. Calcified plaque at the carotid bifurcation and ICA bulb but no stenosis. Cervical ICA widely patent to the upper cervical region. There is irregularity of the vessel at the C1 level consistent with fibromuscular change. Left carotid system: Common carotid artery widely patent to the bifurcation. Calcified plaque at the carotid bifurcation. No stenosis. Cervical ICA is widely patent, with some irregularity in the upper cervical region related to fibromuscular change. Vertebral arteries: No vertebral origin stenosis. Both vertebral arteries appear widely patent through the cervical region to  the C1 level, where there is mild irregularity consistent with fibromuscular disease. Skeleton: Ordinary cervical spondylosis. Other neck: No mass or adenopathy. Upper chest: Mild emphysema and pulmonary scarring. Review of the MIP images confirms the above findings CTA HEAD FINDINGS Anterior circulation: Both internal carotid arteries are patent through the skull base and siphon regions. The anterior and middle cerebral vessels do not show proximal stenosis or occlusion. There is no M1 or M2 stenosis. I can not identify a missing M3 branch on either side. Posterior circulation: Both vertebral arteries are widely patent to the basilar. No basilar stenosis. Posterior circulation branch vessels are patent. Venous sinuses: Patent and normal. Anatomic variants: None significant. Review of the MIP images confirms the above findings CT Brain Perfusion Findings: ASPECTS: 10 CBF (<30%) Volume: 16mL Perfusion (Tmax>6.0s) volume: 67mL Mismatch Volume: 65mL Infarction Location:No infarction identified. Right frontoparietal junction region changes consistent with ischemia in a right M3 branch distribution. IMPRESSION: Ordinary atherosclerosis of the arch and neck vessels without stenosis. Irregularity of the internal carotid arteries and vertebral arteries in the upper neck consistent with fibromuscular disease but without evidence of flow limiting stenosis. No infarction demonstrated by perfusion study. Ischemic change in the right frontoparietal junction region consistent with right M3 branch vessel insult. I can not specifically identify that by CT angiography. Volume measures 8 cc. These results were called by telephone at the time of interpretation on 07/25/2019 at 8:31 pm to Dr. Jimmye Norman, who verbally acknowledged these results. Electronically Signed   By: Nelson Chimes M.D.   On: 07/25/2019 20:34   Ct Head Code Stroke Wo Contrast  Result Date: 07/25/2019 CLINICAL DATA:  Code stroke. Vascular dementia and Alzheimer's  dementia. Left facial droop and speech disturbance beginning after 1700 hours EXAM: CT HEAD WITHOUT CONTRAST TECHNIQUE: Contiguous axial images were obtained from the base of the skull through the vertex without intravenous contrast. COMPARISON:  01/14/2018.  12/13/2014. FINDINGS: Brain:  Brainstem and cerebellum are unremarkable. Cerebral hemispheres show advanced generalized atrophy with temporal predominance. Chronic small-vessel ischemic changes throughout the cerebral hemispheric deep and subcortical white matter. Old left parieto-occipital cortical infarction. Small old left frontal infarction. No sign of acute infarction, mass lesion, hemorrhage, hydrocephalus or extra-axial collection. Vascular: There is atherosclerotic calcification of the major vessels at the base of the brain. Skull: Negative Sinuses/Orbits: Clear/normal Other: None ASPECTS (Oxford Stroke Program Early CT Score) - Ganglionic level infarction (caudate, lentiform nuclei, internal capsule, insula, M1-M3 cortex): 7 - Supraganglionic infarction (M4-M6 cortex): 3 Total score (0-10 with 10 being normal): 10 IMPRESSION: 1. No acute finding by CT. Brain atrophy with temporal lobe predominance. Extensive chronic small-vessel ischemic changes throughout the cerebral hemispheres. Old left frontal and parietooccipital infarctions. 2. ASPECTS is 10. 3. These results were called by telephone at the time of interpretation on 07/25/2019 at 7:16 pm to Dr. Lenise Arena , who verbally acknowledged these results. Electronically Signed   By: Nelson Chimes M.D.   On: 07/25/2019 19:19    Pending Labs Unresulted Labs (From admission, onward)    Start     Ordered   07/25/19 2042  Novel Coronavirus, NAA (Hosp order, Send-out to Ref Lab; TAT 18-24 hrs  (Symptomatic/High Risk of Exposure/Tier 1 Patients Labs with Precautions)  ONCE - STAT,   STAT    Question Answer Comment  Is this test for diagnosis or screening Diagnosis of ill patient   Symptomatic for  COVID-19 as defined by CDC Yes   Date of Symptom Onset 07/25/2019   Hospitalized for COVID-19 No   Admitted to ICU for COVID-19 No   Previously tested for COVID-19 No   Resident in a congregate (group) care setting No   Employed in healthcare setting No   Pregnant No      07/25/19 2041   Signed and Held  Hemoglobin A1c  Tomorrow morning,   R     Signed and Held   Signed and Held  Lipid panel  Tomorrow morning,   R    Comments: Fasting    Signed and Held   Signed and Held  CBC  (enoxaparin (LOVENOX)    CrCl >/= 30 ml/min)  Once,   R    Comments: Baseline for enoxaparin therapy IF NOT ALREADY DRAWN.  Notify MD if PLT < 100 K.    Signed and Held   Signed and Held  Creatinine, serum  (enoxaparin (LOVENOX)    CrCl >/= 30 ml/min)  Once,   R    Comments: Baseline for enoxaparin therapy IF NOT ALREADY DRAWN.    Signed and Held   Signed and Held  Creatinine, serum  (enoxaparin (LOVENOX)    CrCl >/= 30 ml/min)  Weekly,   R    Comments: while on enoxaparin therapy    Signed and Held          Vitals/Pain Today's Vitals   07/25/19 2019 07/25/19 2020 07/25/19 2105 07/25/19 2208  BP: (!) 167/67  (!) 159/130 (!) 153/99  Pulse: 85  86 85  Resp: 15  13 20   Temp: 97.8 F (36.6 C)     SpO2: 98%  98% 98%  Weight:  50 kg    Height:  5\' 1"  (1.549 m)      Isolation Precautions No active isolations  Medications Medications  sodium chloride flush (NS) 0.9 % injection 3 mL (3 mLs Intravenous Not Given 07/25/19 2027)  iohexol (OMNIPAQUE) 350 MG/ML injection 100 mL (100 mLs Intravenous  Contrast Given 07/25/19 1951)    Mobility walks Moderate fall risk   Focused Assessments Neuro Assessment Handoff:  Swallow screen pass? No    NIH Stroke Scale ( + Modified Stroke Scale Criteria)  Interval: Initial Level of Consciousness (1a.)   : Alert, keenly responsive LOC Questions (1b. )   +: Answers neither question correctly LOC Commands (1c. )   + : Performs one task correctly Best Gaze (2.  )  +: Forced deviation Visual (3. )  +: Complete hemianopia Facial Palsy (4. )    : Partial paralysis  Motor Arm, Left (5a. )   +: No drift Motor Arm, Right (5b. )   +: No drift Motor Leg, Left (6a. )   +: Some effort against gravity Motor Leg, Right (6b. )   +: Some effort against gravity Limb Ataxia (7. ): Absent Sensory (8. )   +: Normal, no sensory loss Best Language (9. )   +: Mild-to-moderate aphasia Dysarthria (10. ): Normal Extinction/Inattention (11.)   +: No Abnormality Modified SS Total  +: 12 Complete NIHSS TOTAL: 17 Last date known well: 07/25/19 Last time known well: 1729 Neuro Assessment:   Neuro Checks:   Initial (07/25/19 1909)  Last Documented NIHSS Modified Score: 12 (07/25/19 2110) Has TPA been given? No If patient is a Neuro Trauma and patient is going to OR before floor call report to [place] nurse: [phone] or [phone]     R Recommendations: See Admitting Provider Note  Report given to:   Additional Notes:  This patient will need a sitter.

## 2019-07-25 NOTE — H&P (Signed)
[facility] [place] NAME: Gabrielle Crosby    MR#:  [id number]  DATE OF BIRTH:  08-01-1933  DATE OF ADMISSION:  07/25/2019  PRIMARY CARE PHYSICIAN: Toni Arthurs, NP   REQUESTING/REFERRING PHYSICIAN: Joan Mayans, MD  CHIEF COMPLAINT:   Chief Complaint  Patient presents with  . Code Stroke    HISTORY OF PRESENT ILLNESS:  Gabrielle Crosby  is a 85 y.o. female who presents with chief complaint as above.  Patient presents to the ED after she was noticed by family to have developed left facial droop.  She has dementia at baseline and does not contribute to the HPI.  Her daughter and caretaker is at bedside and provides the story.  She states that she noticed this around dinnertime, but does not know exactly the patient first developed it.  Work-up in the ED is suspicious for stroke, but largely within normal limits.  Telemetry neurology recommended admission with MRI and neurology consult.  Hospitalist were called for the same  PAST MEDICAL HISTORY:   Past Medical History:  Diagnosis Date  . Arthritis    hands  . Bladder cancer ([place])   . Brain bleed ([place]) 2019   d/t light strokes, high bp, seizures. brain has shrunk per dr. Manuella Ghazi and mri  . Cancer ([place])    BLADDER X 2  . Dementia ([place]) 2019   dementia progressing  . Depression   . GERD (gastroesophageal reflux disease)    chokes easily even on own saliva and then has panic attack  . Hyperlipidemia   . Hypertension 2019   no medications.  no htn per daughter  . Seizures [facility])    family not aware of any seizures and no treatment  . Stroke [facility]) 2019   undiagnosed but per neuro, she has had them causing vascular dementia  . Wears dentures    partial upper and lower     PAST SURGICAL HISTORY:   Past Surgical History:  Procedure Laterality Date  . ABDOMINAL HYSTERECTOMY Bilateral    BILATERAL s&o  . BACK SURGERY    . BLADDER SURGERY N/A 2015   REMOVAL OF BLADDER CANCER X 2  .  BOWEL RESECTION N/A 1999   SMALL BOWEL OBSTRUCTION  . CHOLECYSTECTOMY    . COLONOSCOPY N/A 11/06/2015   Procedure: COLONOSCOPY;  Surgeon: Hulen Luster, MD;  Location: [facility];  Service: Gastroenterology;  Laterality: N/A;  . CYSTOSCOPY WITH BIOPSY N/A 04/17/2015   Procedure: CYSTOSCOPY WITH BIOPSY;  Surgeon: Hollice Espy, MD;  Location: [facility];  Service: Urology;  Laterality: N/A;  . KYPHOPLASTY N/A 12/20/2017   Procedure: [id number];  Surgeon: Hessie Knows, MD;  Location: [facility];  Service: Orthopedics;  Laterality: N/A;  . KYPHOPLASTY N/A 04/21/2018   Procedure: KYPHOPLASTY-L2,L3,L4,L5;  Surgeon: Hessie Knows, MD;  Location: [facility];  Service: Orthopedics;  Laterality: N/A;  . SACROPLASTY N/A 11/03/2018   Procedure: Dillard Cannon;  Surgeon: Hessie Knows, MD;  Location: [facility];  Service: Orthopedics;  Laterality: N/A;     SOCIAL HISTORY:   Social History   Tobacco Use  . Smoking status: Never Smoker  . Smokeless tobacco: Never Used  Substance Use Topics  . Alcohol use: Never    Alcohol/week: 0.0 standard drinks    Frequency: Never     FAMILY HISTORY:   Family History  Family history unknown: Yes     DRUG ALLERGIES:   Allergies  Allergen Reactions  . Atenolol Other (See Comments)  Bradycardia   . Donepezil Hcl Other (See Comments)    Mental changes, "made me crazy"    MEDICATIONS AT HOME:   Prior to Admission medications   Medication Sig Start Date End Date Taking? Authorizing Provider  acetaminophen (TYLENOL) 500 MG tablet Take 500 mg by mouth 2 (two) times daily.    Yes [provider]  aspirin EC 81 MG tablet Take 81 mg by mouth daily.   Yes [provider]  Cholecalciferol (VITAMIN D3) 50 MCG (2000 UT) capsule Take 2,000 Units by mouth daily.  05/10/19  Yes [provider]  citalopram (CELEXA) 20 MG tablet Take 20 mg by mouth daily.    Yes [provider]  docusate sodium (COLACE) 50 MG capsule Take 50  mg by mouth 2 (two) times daily as needed for mild constipation.   Yes [provider]  lovastatin (MEVACOR) 20 MG tablet Take 20 mg by mouth at bedtime.  05/27/15  Yes [provider]  Magnesium 100 MG CAPS Take 100 mg by mouth daily.    Yes [provider]  memantine (NAMENDA) 10 MG tablet Take 10 mg by mouth 2 (two) times daily.  11/05/16  Yes [provider]  QUEtiapine (SEROQUEL) 25 MG tablet Take 50 mg by mouth at bedtime.  05/10/19 05/09/20 Yes [provider]  sulfamethoxazole-trimethoprim (BACTRIM DS) 800-160 MG tablet Take 1 tablet by mouth 2 (two) times daily. 07/24/19  Yes Hollice Espy, MD  vitamin B-12 (CYANOCOBALAMIN) 1000 MCG tablet Take 1,000 mcg by mouth daily.   Yes [provider]    REVIEW OF SYSTEMS:  Review of Systems  Unable to perform ROS: Dementia     VITAL SIGNS:   Vitals:   07/25/19 2019 07/25/19 2020 07/25/19 2105  BP: (!) 167/67  (!) 159/130  Pulse: 85  86  Resp: 15  13  Temp: 97.8 F (36.6 C)    SpO2: 98%  98%  Weight:  50 kg   Height:  5\' 1"  (1.549 m)    Wt Readings from Last 3 Encounters:  07/25/19 50 kg  07/18/19 49 kg  11/03/18 48.1 kg    PHYSICAL EXAMINATION:  Physical Exam  Vitals reviewed. Constitutional: She appears well-developed and well-nourished. No distress.  HENT:  Head: Normocephalic and atraumatic.  Mouth/Throat: Oropharynx is clear and moist.  Eyes: Pupils are equal, round, and reactive to light. Conjunctivae and EOM are normal. No scleral icterus.  Neck: Normal range of motion. Neck supple. No JVD present. No thyromegaly present.  Cardiovascular: Normal rate, regular rhythm and intact distal pulses. Exam reveals no gallop and no friction rub.  No murmur heard. Respiratory: Effort normal and breath sounds normal. No respiratory distress. She has no wheezes. She has no rales.  GI: Soft. Bowel sounds are normal. She exhibits no distension. There is no abdominal tenderness.   Musculoskeletal: Normal range of motion.        General: No edema.     Comments: No arthritis, no gout  Lymphadenopathy:    She has no cervical adenopathy.  Neurological: She is alert. No cranial nerve deficit.  Neurologic: Cranial nerves II-XII intact except for left lower facial droop, Sensation intact to light touch/pinprick, 5/5 strength in all extremities except for left upper extremity which has 4/5 strength  Exam is limited by patient's inability to follow commands  Skin: Skin is warm and dry. No rash noted. No erythema.  Psychiatric:  Unable to assess due to patient condition    LABORATORY  PANEL:   CBC Recent Labs  Lab 07/25/19 1922  WBC 6.3  HGB 13.6  HCT 42.1  PLT 260   ------------------------------------------------------------------------------------------------------------------  Chemistries  Recent Labs  Lab 07/25/19 1922  NA 139  K 3.9  CL 105  CO2 25  GLUCOSE 105*  BUN 14  CREATININE 0.84  CALCIUM 9.2  AST 19  ALT 12  ALKPHOS 71  BILITOT 0.6   ------------------------------------------------------------------------------------------------------------------  Cardiac Enzymes No results for input(s): TROPONINI in the last 168 hours. ------------------------------------------------------------------------------------------------------------------  RADIOLOGY:  Ct Angio Head W Or Wo Contrast  Result Date: 07/25/2019 CLINICAL DATA:  Vascular dementia and Alzheimer's dementia. Left facial droop. Difficulty swallowing. Symptoms began 1800 hours EXAM: CT ANGIOGRAPHY HEAD AND NECK CT PERFUSION BRAIN TECHNIQUE: Multidetector CT imaging of the head and neck was performed using the standard protocol during bolus administration of intravenous contrast. Multiplanar CT image reconstructions and MIPs were obtained to evaluate the vascular anatomy. Carotid stenosis measurements (when applicable) are obtained utilizing NASCET criteria, using the distal internal  carotid diameter as the denominator. Multiphase CT imaging of the brain was performed following IV bolus contrast injection. Subsequent parametric perfusion maps were calculated using RAPID software. CONTRAST:  175mL OMNIPAQUE IOHEXOL 350 MG/ML SOLN COMPARISON:  Head CT earlier same day.  MRI 01/14/2018 FINDINGS: CTA NECK FINDINGS Aortic arch: Aortic atherosclerosis. No aneurysm or dissection. Branching pattern is normal. Right carotid system: Common carotid artery widely patent to the bifurcation region. Calcified plaque at the carotid bifurcation and ICA bulb but no stenosis. Cervical ICA widely patent to the upper cervical region. There is irregularity of the vessel at the C1 level consistent with fibromuscular change. Left carotid system: Common carotid artery widely patent to the bifurcation. Calcified plaque at the carotid bifurcation. No stenosis. Cervical ICA is widely patent, with some irregularity in the upper cervical region related to fibromuscular change. Vertebral arteries: No vertebral origin stenosis. Both vertebral arteries appear widely patent through the cervical region to the C1 level, where there is mild irregularity consistent with fibromuscular disease. Skeleton: Ordinary cervical spondylosis. Other neck: No mass or adenopathy. Upper chest: Mild emphysema and pulmonary scarring. Review of the MIP images confirms the above findings CTA HEAD FINDINGS Anterior circulation: Both internal carotid arteries are patent through the skull base and siphon regions. The anterior and middle cerebral vessels do not show proximal stenosis or occlusion. There is no M1 or M2 stenosis. I can not identify a missing M3 branch on either side. Posterior circulation: Both vertebral arteries are widely patent to the basilar. No basilar stenosis. Posterior circulation branch vessels are patent. Venous sinuses: Patent and normal. Anatomic variants: None significant. Review of the MIP images confirms the above findings  CT Brain Perfusion Findings: ASPECTS: 10 CBF (<30%) Volume: 25mL Perfusion (Tmax>6.0s) volume: 38mL Mismatch Volume: 28mL Infarction Location:No infarction identified. Right frontoparietal junction region changes consistent with ischemia in a right M3 branch distribution. IMPRESSION: Ordinary atherosclerosis of the arch and neck vessels without stenosis. Irregularity of the internal carotid arteries and vertebral arteries in the upper neck consistent with fibromuscular disease but without evidence of flow limiting stenosis. No infarction demonstrated by perfusion study. Ischemic change in the right frontoparietal junction region consistent with right M3 branch vessel insult. I can not specifically identify that by CT angiography. Volume measures 8 cc. These results were called by telephone at the time of interpretation on 07/25/2019 at 8:31 pm to Dr. Jimmye Norman, who verbally acknowledged these results. Electronically Signed   By: Jan Fireman.D.  On: 07/25/2019 20:34   Ct Angio Neck W Or Wo Contrast  Result Date: 07/25/2019 CLINICAL DATA:  Vascular dementia and Alzheimer's dementia. Left facial droop. Difficulty swallowing. Symptoms began 1800 hours EXAM: CT ANGIOGRAPHY HEAD AND NECK CT PERFUSION BRAIN TECHNIQUE: Multidetector CT imaging of the head and neck was performed using the standard protocol during bolus administration of intravenous contrast. Multiplanar CT image reconstructions and MIPs were obtained to evaluate the vascular anatomy. Carotid stenosis measurements (when applicable) are obtained utilizing NASCET criteria, using the distal internal carotid diameter as the denominator. Multiphase CT imaging of the brain was performed following IV bolus contrast injection. Subsequent parametric perfusion maps were calculated using RAPID software. CONTRAST:  128mL OMNIPAQUE IOHEXOL 350 MG/ML SOLN COMPARISON:  Head CT earlier same day.  MRI 01/14/2018 FINDINGS: CTA NECK FINDINGS Aortic arch: Aortic  atherosclerosis. No aneurysm or dissection. Branching pattern is normal. Right carotid system: Common carotid artery widely patent to the bifurcation region. Calcified plaque at the carotid bifurcation and ICA bulb but no stenosis. Cervical ICA widely patent to the upper cervical region. There is irregularity of the vessel at the C1 level consistent with fibromuscular change. Left carotid system: Common carotid artery widely patent to the bifurcation. Calcified plaque at the carotid bifurcation. No stenosis. Cervical ICA is widely patent, with some irregularity in the upper cervical region related to fibromuscular change. Vertebral arteries: No vertebral origin stenosis. Both vertebral arteries appear widely patent through the cervical region to the C1 level, where there is mild irregularity consistent with fibromuscular disease. Skeleton: Ordinary cervical spondylosis. Other neck: No mass or adenopathy. Upper chest: Mild emphysema and pulmonary scarring. Review of the MIP images confirms the above findings CTA HEAD FINDINGS Anterior circulation: Both internal carotid arteries are patent through the skull base and siphon regions. The anterior and middle cerebral vessels do not show proximal stenosis or occlusion. There is no M1 or M2 stenosis. I can not identify a missing M3 branch on either side. Posterior circulation: Both vertebral arteries are widely patent to the basilar. No basilar stenosis. Posterior circulation branch vessels are patent. Venous sinuses: Patent and normal. Anatomic variants: None significant. Review of the MIP images confirms the above findings CT Brain Perfusion Findings: ASPECTS: 10 CBF (<30%) Volume: 39mL Perfusion (Tmax>6.0s) volume: 55mL Mismatch Volume: 39mL Infarction Location:No infarction identified. Right frontoparietal junction region changes consistent with ischemia in a right M3 branch distribution. IMPRESSION: Ordinary atherosclerosis of the arch and neck vessels without stenosis.  Irregularity of the internal carotid arteries and vertebral arteries in the upper neck consistent with fibromuscular disease but without evidence of flow limiting stenosis. No infarction demonstrated by perfusion study. Ischemic change in the right frontoparietal junction region consistent with right M3 branch vessel insult. I can not specifically identify that by CT angiography. Volume measures 8 cc. These results were called by telephone at the time of interpretation on 07/25/2019 at 8:31 pm to Dr. Jimmye Norman, who verbally acknowledged these results. Electronically Signed   By: Nelson Chimes M.D.   On: 07/25/2019 20:34   Ct Cerebral Perfusion W Contrast  Result Date: 07/25/2019 CLINICAL DATA:  Vascular dementia and Alzheimer's dementia. Left facial droop. Difficulty swallowing. Symptoms began 1800 hours EXAM: CT ANGIOGRAPHY HEAD AND NECK CT PERFUSION BRAIN TECHNIQUE: Multidetector CT imaging of the head and neck was performed using the standard protocol during bolus administration of intravenous contrast. Multiplanar CT image reconstructions and MIPs were obtained to evaluate the vascular anatomy. Carotid stenosis measurements (when applicable) are obtained utilizing NASCET  criteria, using the distal internal carotid diameter as the denominator. Multiphase CT imaging of the brain was performed following IV bolus contrast injection. Subsequent parametric perfusion maps were calculated using RAPID software. CONTRAST:  185mL OMNIPAQUE IOHEXOL 350 MG/ML SOLN COMPARISON:  Head CT earlier same day.  MRI 01/14/2018 FINDINGS: CTA NECK FINDINGS Aortic arch: Aortic atherosclerosis. No aneurysm or dissection. Branching pattern is normal. Right carotid system: Common carotid artery widely patent to the bifurcation region. Calcified plaque at the carotid bifurcation and ICA bulb but no stenosis. Cervical ICA widely patent to the upper cervical region. There is irregularity of the vessel at the C1 level consistent with  fibromuscular change. Left carotid system: Common carotid artery widely patent to the bifurcation. Calcified plaque at the carotid bifurcation. No stenosis. Cervical ICA is widely patent, with some irregularity in the upper cervical region related to fibromuscular change. Vertebral arteries: No vertebral origin stenosis. Both vertebral arteries appear widely patent through the cervical region to the C1 level, where there is mild irregularity consistent with fibromuscular disease. Skeleton: Ordinary cervical spondylosis. Other neck: No mass or adenopathy. Upper chest: Mild emphysema and pulmonary scarring. Review of the MIP images confirms the above findings CTA HEAD FINDINGS Anterior circulation: Both internal carotid arteries are patent through the skull base and siphon regions. The anterior and middle cerebral vessels do not show proximal stenosis or occlusion. There is no M1 or M2 stenosis. I can not identify a missing M3 branch on either side. Posterior circulation: Both vertebral arteries are widely patent to the basilar. No basilar stenosis. Posterior circulation branch vessels are patent. Venous sinuses: Patent and normal. Anatomic variants: None significant. Review of the MIP images confirms the above findings CT Brain Perfusion Findings: ASPECTS: 10 CBF (<30%) Volume: 65mL Perfusion (Tmax>6.0s) volume: 44mL Mismatch Volume: 19mL Infarction Location:No infarction identified. Right frontoparietal junction region changes consistent with ischemia in a right M3 branch distribution. IMPRESSION: Ordinary atherosclerosis of the arch and neck vessels without stenosis. Irregularity of the internal carotid arteries and vertebral arteries in the upper neck consistent with fibromuscular disease but without evidence of flow limiting stenosis. No infarction demonstrated by perfusion study. Ischemic change in the right frontoparietal junction region consistent with right M3 branch vessel insult. I can not specifically  identify that by CT angiography. Volume measures 8 cc. These results were called by telephone at the time of interpretation on 07/25/2019 at 8:31 pm to Dr. Jimmye Norman, who verbally acknowledged these results. Electronically Signed   By: Nelson Chimes M.D.   On: 07/25/2019 20:34   Ct Head Code Stroke Wo Contrast  Result Date: 07/25/2019 CLINICAL DATA:  Code stroke. Vascular dementia and Alzheimer's dementia. Left facial droop and speech disturbance beginning after 1700 hours EXAM: CT HEAD WITHOUT CONTRAST TECHNIQUE: Contiguous axial images were obtained from the base of the skull through the vertex without intravenous contrast. COMPARISON:  01/14/2018.  12/13/2014. FINDINGS: Brain: Brainstem and cerebellum are unremarkable. Cerebral hemispheres show advanced generalized atrophy with temporal predominance. Chronic small-vessel ischemic changes throughout the cerebral hemispheric deep and subcortical white matter. Old left parieto-occipital cortical infarction. Small old left frontal infarction. No sign of acute infarction, mass lesion, hemorrhage, hydrocephalus or extra-axial collection. Vascular: There is atherosclerotic calcification of the major vessels at the base of the brain. Skull: Negative Sinuses/Orbits: Clear/normal Other: None ASPECTS ([place] Stroke Program Early CT Score) - Ganglionic level infarction (caudate, lentiform nuclei, internal capsule, insula, M1-M3 cortex): 7 - Supraganglionic infarction (M4-M6 cortex): 3 Total score (0-10 with 10 being normal):  10 IMPRESSION: 1. No acute finding by CT. Brain atrophy with temporal lobe predominance. Extensive chronic small-vessel ischemic changes throughout the cerebral hemispheres. Old left frontal and parietooccipital infarctions. 2. ASPECTS is 10. 3. These results were called by telephone at the time of interpretation on 07/25/2019 at 7:16 pm to Dr. Lenise Arena , who verbally acknowledged these results. Electronically Signed   By: Nelson Chimes M.D.    On: 07/25/2019 19:19    EKG:   Orders placed or performed during the hospital encounter of 07/25/19  . ED EKG  . ED EKG  . EKG 12-Lead  . EKG 12-Lead    IMPRESSION AND PLAN:  Principal Problem:   Facial droop -strong concern for stroke.  Admit per stroke admission order set with appropriate imaging, labs, consults including neurology consult. Active Problems:   Essential (primary) hypertension -permissive hypertension, blood pressure goal less than 220/120, hold antihypertensives for now.   Mixed Alzheimer's and vascular dementia ([place]) -continue home meds once the patient is safe to take p.o.   HLD (hyperlipidemia) -home dose antilipid once the patient can take p.o.   GERD (gastroesophageal reflux disease) -home dose PPI once the patient can take p.o.   Chart review performed and case discussed with ED provider. Labs, imaging and/or ECG reviewed by provider and discussed with patient/family. Management plans discussed with the patient and/or family.  COVID-19 status: Pending  DVT PROPHYLAXIS: SubQ lovenox   GI PROPHYLAXIS:  PPI   ADMISSION STATUS: Inpatient     CODE STATUS: Full Code Status History    Date Active Date Inactive Code Status Order ID Comments User Context   11/03/2018 1553 11/03/2018 1941 Full Code [id number]  Hessie Knows, MD Inpatient   04/21/2018 1346 04/21/2018 1652 Full Code [id number]  Hessie Knows, MD Inpatient   12/20/2017 1307 12/20/2017 1644 Full Code [id number]  Hessie Knows, MD Inpatient   Advance Care Planning Activity    Advance Directive Documentation     Most Recent Value  Type of Advance Directive  Healthcare Power of Attorney  Pre-existing out of facility DNR order (yellow form or pink MOST form)  -  "MOST" Form in Place?  -      TOTAL TIME TAKING CARE OF THIS PATIENT: 45 minutes.   This patient was evaluated in the context of the global COVID-19 pandemic, which necessitated consideration that the patient might be at risk for infection  with the SARS-CoV-2 virus that causes COVID-19. Institutional protocols and algorithms that pertain to the evaluation of patients at risk for COVID-19 are in a state of rapid change based on information released by regulatory bodies including the CDC and federal and state organizations. These policies and algorithms were followed to the best of this provider's knowledge to date during the patient's care at this facility.  Ethlyn Daniels 07/25/2019, 10:05 PM  [facility]  Office  [phone]  CC: Primary care physician; Toni Arthurs, NP  Note:  This document was prepared using Dragon voice recognition software and may include unintentional dictation errors.

## 2019-07-25 NOTE — ED Notes (Signed)
Pt cleared for CT by Dr. Ellender Hose at [phone].

## 2019-07-25 NOTE — ED Triage Notes (Signed)
Arrives with daughter.  Patient has history of vascular dementia and alzheimer's dementia.  Patient was last seen at her baseline at 1700.  Daughter noticed left facial droop, change of speech, and difficulty swallowing at around 1800.

## 2019-07-25 NOTE — Consult Note (Addendum)
Addendum: No new or acute large vessel occlusion on head and neck CTA, not a candidate for mechanical thrombectomy.   /////    [facility] Tele[facility]urology Consult Services   Date of Service:   07/25/2019 19:20:32  Impression:     .  Right Hemispheric Infarct  Comments/Sign-Out: Patient with history of Intracranial Hemorrhage, Hyperlipidemia/Hypercholesterolemia, DVT, dementia Presents with right gaze deviation, left sided weakness Head CT: No acute Intracranial hemorrhage. NIHSS 17 Symptoms/presentation is consistent with right hemispheric Acute Ischemic Stroke, likely Large Vessel Occlusion. Has history of Intracranial Hemorrhage therefore not a candidate for IV Alteplase. Will do STAT head and neck CTA to investigate for Large Vessel Occlusion.  Mechanism of Stroke: Small Vessel Disease  Metrics: Last Known Well: 07/25/2019 17:30:00 [facility] Notification Time: 07/25/2019 19:20:32 Arrival Time: 07/25/2019 19:02:00 Stamp Time: 07/25/2019 19:20:32 Time First Login Attempt: 07/25/2019 19:26:30 Video Start Time: 07/25/2019 19:26:30  NIHSS assessment time 19:30  Symptoms: gaze deviation, altered mental status Patient is not a candidate for tPA. Patient was not deemed candidate for tPA thrombolytics because of Current or Previous ICH. Video End Time: 07/25/2019 19:44:10  CT head was reviewed.  Clinical Presentation is Suggestive of Large Vessel Occlusive Disease, Recommendations are as Follows  CTA Head and Neck. CT Perfusion.   ED Physician notified of diagnostic impression and management plan on 07/25/2019 19:46:14  Our recommendations are outlined below.  Recommendations:     .  Activate Stroke Protocol Admission/Order Set     .  Stroke/Telemetry Floor     .  Neuro Checks     .  Bedside Swallow Eval     .  DVT Prophylaxis     .  IV Fluids, Normal Saline     .  Head of Bed 30 Degrees     .  Euglycemia and Avoid Hyperthermia (PRN  Acetaminophen)     .  Antiplatelet Therapy Recommended     .  Stroke work up with: Noncontrast Brain MRI,, lipid panel, HbA1c (if not done in the past 3 months), Transthoracic ECHO (evaluate for Ejection fraction, wall motion abnormalities, vegetations, thrombus (left ventricle or left atrial appendage), possible PFO (if there is a PFO then venous doppler of all extremities and pelvic veins))  Routine Consultation with [facility] for Follow up Care  Sign Out:     .  Discussed with Emergency Department Provider    ------------------------------------------------------------------------------  History of Present Illness: Patient is a 85 year old Female.  Patient was brought by private transportation with symptoms of gaze deviation, altered mental status  Consult for Stroke Evaluation Patient with history of Intracranial Hemorrhage, Hyperlipidemia/Hypercholesterolemia, DVT, dementia last known well per family: 17:30 Presents with/Chief complaint/reason for consult: right sided gaze, left facial droop, altered mental status Anticoagulation or Antiplatelet use: aspirin Premorbid Level of function: needs assistance in ADLs, gait is intact; has dementia (wont know name or month at baseline) Pertinent medical, surgical, family history reviewed. Medications list and allergies reviewed. Pertinent positive and negative review of systems noted in History of Present Illness.  There is history of hemorrhagic complications or intracranial hemorrhage.  Past Medical History:  Anticoagulant use:  No  Antiplatelet use: aspirin  Examination: 1A: Level of Consciousness - Alert; keenly responsive + 0 1B: Ask Month and Age - Could Not Answer Either Question Correctly + 2 1C: Blink Eyes & Squeeze Hands - Performs Both Tasks + 0 2: Test Horizontal Extraocular Movements - Forced Gaze Palsy: Cannot Be Overcome + 2 3: Test Visual Fields -  Complete Hemianopia + 2 4: Test Facial Palsy (Use Grimace if  Obtunded) - Partial paralysis (lower face) + 2 5A: Test Left Arm Motor Drift - Some Effort Against Gravity + 2 5B: Test Right Arm Motor Drift - No Drift for 10 Seconds + 0 6A: Test Left Leg Motor Drift - No Effort Against Gravity + 3 6B: Test Right Leg Motor Drift - Some Effort Against Gravity + 2 7: Test Limb Ataxia (FNF/Heel-Shin) - No Ataxia + 0 8: Test Sensation - Normal; No sensory loss + 0 9: Test Language/Aphasia - Severe Aphasia: Fragmentary Expression, Inference Needed, Cannot Identify Materials + 2 10: Test Dysarthria - Normal + 0 11: Test Extinction/Inattention - No abnormality + 0  NIHSS Score: 17  Patient/Family was informed the Neurology Consult would happen via TeleHealth consult by way of interactive audio and video telecommunications and consented to receiving care in this manner.   Due to the immediate potential for life-threatening deterioration due to underlying acute neurologic illness, I spent 18 minutes providing critical care. This time includes time for face to face visit via telemedicine, review of medical records, imaging studies and discussion of findings with providers, the patient and/or family.   Dr Elenor Quinones   [facility] [phone]   Case [id number]

## 2019-07-25 NOTE — ED Triage Notes (Addendum)
At approximately 1745hrs, the patient's daughter observed the patient beginning to talk in a whisper, keeping her head to the right, and her mouth starting to "draw". Patient has a history of brain bleeds.

## 2019-07-25 NOTE — ED Provider Notes (Signed)
[facility] Emergency Department Provider Note  ____________________________________________   First MD Initiated Contact with Patient 07/25/19 1947     (approximate)  I have reviewed the triage vital signs and the nursing notes.  History  Chief Complaint Code Stroke    HPI BUFFEY THURLOW is a 85 y.o. female with a history of brain bleed, vascular dementia, Alzheimer's who presents to the emergency department for acute onset of left-sided facial droop, difficulty swallowing, change in interactional level.  Last known normal 5:30/5:45 PM by daughter.  History obtained from daughter, limited due to patient's symptoms and history of dementia.         Past Medical Hx Past Medical History:  Diagnosis Date  . Arthritis    hands  . Bladder cancer ([place])   . Brain bleed ([place]) 2019   d/t light strokes, high bp, seizures. brain has shrunk per dr. Manuella Ghazi and mri  . Cancer ([place])    BLADDER X 2  . Dementia ([place]) 2019   dementia progressing  . Depression   . GERD (gastroesophageal reflux disease)    chokes easily even on own saliva and then has panic attack  . Hyperlipidemia   . Hypertension 2019   no medications.  no htn per daughter  . Seizures [facility])    family not aware of any seizures and no treatment  . Stroke [facility]) 2019   undiagnosed but per neuro, she has had them causing vascular dementia  . Wears dentures    partial upper and lower    Problem List Patient Active Problem List   Diagnosis Date Noted  . Loss of memory 07/26/2016  . Anxiety 06/26/2016  . Bradycardia 01/02/2015  . H/O deep venous thrombosis 01/02/2015  . MI (mitral incompetence) 01/02/2015  . Near syncope 01/02/2015  . Malignant neoplasm of urinary bladder ([place]) 08/22/2014  . Mixed Alzheimer's and vascular dementia ([place]) 08/22/2014  . Essential (primary) hypertension 08/22/2014  . Adaptive colitis 08/22/2014  . Lacunar infarction ([place]) 08/22/2014  . Combined fat and  carbohydrate induced hyperlipemia 08/22/2014  . Allergic rhinitis, seasonal 08/22/2014    Past Surgical Hx Past Surgical History:  Procedure Laterality Date  . ABDOMINAL HYSTERECTOMY Bilateral    BILATERAL s&o  . BACK SURGERY    . BLADDER SURGERY N/A 2015   REMOVAL OF BLADDER CANCER X 2  . BOWEL RESECTION N/A 1999   SMALL BOWEL OBSTRUCTION  . CHOLECYSTECTOMY    . COLONOSCOPY N/A 11/06/2015   Procedure: COLONOSCOPY;  Surgeon: Hulen Luster, MD;  Location: [place];  Service: Gastroenterology;  Laterality: N/A;  . CYSTOSCOPY WITH BIOPSY N/A 04/17/2015   Procedure: CYSTOSCOPY WITH BIOPSY;  Surgeon: Hollice Espy, MD;  Location: [facility];  Service: Urology;  Laterality: N/A;  . KYPHOPLASTY N/A 12/20/2017   Procedure: [id number];  Surgeon: Hessie Knows, MD;  Location: [facility];  Service: Orthopedics;  Laterality: N/A;  . KYPHOPLASTY N/A 04/21/2018   Procedure: KYPHOPLASTY-L2,L3,L4,L5;  Surgeon: Hessie Knows, MD;  Location: [facility];  Service: Orthopedics;  Laterality: N/A;  . SACROPLASTY N/A 11/03/2018   Procedure: Dillard Cannon;  Surgeon: Hessie Knows, MD;  Location: [facility];  Service: Orthopedics;  Laterality: N/A;    Medications Prior to Admission medications   Medication Sig Start Date End Date Taking? Authorizing Provider  acetaminophen (TYLENOL) 500 MG tablet Take 1,000 mg by mouth 2 (two) times daily.     [provider]  aspirin EC 81 MG tablet Take 81 mg by mouth daily.  [provider]  Cholecalciferol (VITAMIN D3) 50 MCG (2000 UT) capsule Take 3 capsules daily for 3 months, then reduce to 1 capsule daily thereafter for Vitamin D Deficiency. 05/10/19   [provider]  citalopram (CELEXA) 20 MG tablet Take 20 mg by mouth daily.     [provider]  docusate sodium (COLACE) 50 MG capsule Take 50 mg by mouth 2 (two) times daily as needed for mild constipation.    [provider]  lovastatin (MEVACOR) 20 MG tablet Take 20  mg by mouth at bedtime.  05/27/15   [provider]  Magnesium 250 MG TABS Take 250 mg by mouth daily.     [provider]  memantine (NAMENDA) 10 MG tablet Take 10 mg by mouth 2 (two) times daily.  11/05/16   [provider]  oxyCODONE (ROXICODONE) 5 MG immediate release tablet Take 1 tablet (5 mg total) by mouth every 8 (eight) hours as needed. Patient taking differently: Take 5 mg by mouth every 4 (four) hours as needed for moderate pain.  10/01/18 10/01/19  Rudene Re, MD  oxyCODONE (ROXICODONE) 5 MG immediate release tablet Take 1 tablet (5 mg total) by mouth every 8 (eight) hours as needed. 11/03/18 11/03/19  Hessie Knows, MD  QUEtiapine (SEROQUEL) 25 MG tablet Take by mouth. 05/10/19 05/09/20  [provider]  sulfamethoxazole-trimethoprim (BACTRIM DS) 800-160 MG tablet Take 1 tablet by mouth 2 (two) times daily. 07/24/19   Hollice Espy, MD    Allergies Atenolol and Donepezil hcl  Family Hx Family History  Family history unknown: Yes    Social Hx Social History   Tobacco Use  . Smoking status: Never Smoker  . Smokeless tobacco: Never Used  Substance Use Topics  . Alcohol use: Never    Alcohol/week: 0.0 standard drinks    Frequency: Never  . Drug use: No     Review of Systems Unable to obtain due to history of dementia and clinical status.   Physical Exam  Vital Signs: ED Triage Vitals  Enc Vitals Group     BP 07/25/19 2019 (!) 167/67     Pulse Rate 07/25/19 2019 85     Resp 07/25/19 2019 15     Temp 07/25/19 2019 97.8 F (36.6 C)     Temp src --      SpO2 07/25/19 2019 98 %     Weight 07/25/19 2020 110 lb 4.8 oz (50 kg)     Height 07/25/19 2020 5\' 1"  (1.549 m)     Head Circumference --      Peak Flow --      Pain Score --      Pain Loc --      Pain Edu? --      Excl. in [place]? --     Constitutional: Alert.  Follows some commands. Able to state name, unable to state age (this is baseline per daguhter) Eyes:  Conjunctivae clear. Sclera anicteric.  Right-sided gaze preference, unable to cross midline on leftward gaze.  Does not blink to threat on left. Head: Normocephalic. Atraumatic.  Left-sided facial droop. Nose: No congestion. No rhinorrhea. Mouth/Throat: Mucous membranes are moist.  Neck: No stridor.   Cardiovascular: Normal rate, regular rhythm. Extremities well perfused. Respiratory: Normal respiratory effort.  Lungs CTAB. Gastrointestinal: Soft and non-tender. No distention.  Musculoskeletal: No lower extremity edema. Neurologic:  NIHSS 17. Particularly notable for left sided facial droop, gaze deviation, field cut, left sided weakness.  Skin: Skin is warm,  dry and intact. No rash noted. Psychiatric: Mood and affect are appropriate for situation.  EKG  Personally reviewed.   Rate: 86 Rhythm: sinus Axis: normal Intervals: QTC 514 ms No acute ischemic changes   Radiology  CT: Ischemic change in the right frontoparietal junction region consistent with right M3 branch vessel insult. I can not specifically identify that by CT angiography. Volume measures 8 cc.   Procedures  Procedure(s) performed (including critical care):  .Critical Care Performed by: Lilia Pro., MD Authorized by: Lilia Pro., MD   Critical care provider statement:    Critical care time (minutes):  30   Critical care was necessary to treat or prevent imminent or life-threatening deterioration of the following conditions:  CNS failure or compromise   Critical care was time spent personally by me on the following activities:  Discussions with consultants, evaluation of patient's response to treatment, examination of patient, ordering and performing treatments and interventions, ordering and review of laboratory studies, ordering and review of radiographic studies, pulse oximetry, re-evaluation of patient's condition, obtaining history from patient or surrogate and review of old charts     Initial  Impression / Assessment and Plan / ED Course  85 y.o. female with a history of brain bleed, vascular dementia, Alzheimer's who presents to the emergency department for acute onset of left-sided facial droop, difficulty swallowing, change in interactional level.  Last known normal 5:30/5:45 PM by daughter.  On arrival she does have prominent left sided facial droop, left sided field deficit, left sided weakness, gaze deviation towards right. NIHSS 17. Evaluated with the assistance of tele Neurology. Presentation consistent with acute CVA. Initial head CT w/o bleeding or mass lesion.She is not a tPA candidate due to hx of intracranial bleeding. Given her symptoms, concern for LVO, will pursue CT angio and perfusion to evaluate for potential occlusion amenable to thrombectomy.    CT perfusion imaging with ischemic change in R frontoparietal CVA. No large vessel occlusion amenable to thrombectomy.   Will plan for admission, discussed w/ hospitalist.    Final Clinical Impression(s) / ED Diagnosis  Final diagnoses:  Cerebrovascular accident (CVA), unspecified mechanism ([place])  Facial droop       Note:  This document was prepared using Dragon voice recognition software and may include unintentional dictation errors.   Lilia Pro., MD 07/25/19 [phone]

## 2019-07-25 NOTE — Progress Notes (Signed)
   07/25/19 2000  Clinical Encounter Type  Visited With Patient not available  Visit Type Code  Referral From Nurse   Chaplain received a code stroke page for this patient. Upon arrival, the patient was engaged in a teleneurology assessment. This chaplain maintained pastoral presence outside the patients room, but later needed to respond to another call. This chaplain made the unit secretary aware and asked her to call or page if any needs arose.

## 2019-07-26 ENCOUNTER — Inpatient Hospital Stay (HOSPITAL_COMMUNITY)
Admit: 2019-07-26 | Discharge: 2019-07-26 | Disposition: A | Discharge status: Home / Self Care | Payer: Medicare Other | Attending: Internal Medicine | Admitting: Internal Medicine

## 2019-07-26 ENCOUNTER — Inpatient Hospital Stay: Payer: Medicare Other

## 2019-07-26 DIAGNOSIS — I6389 Other cerebral infarction: Secondary | ICD-10-CM

## 2019-07-26 DIAGNOSIS — I639 Cerebral infarction, unspecified: Principal | ICD-10-CM

## 2019-07-26 LAB — LIPID PANEL
Cholesterol: 157 mg/dL (ref 0–200)
HDL: 65 mg/dL (ref >40)
LDL Cholesterol: 79 mg/dL (ref 0–99)
Total CHOL/HDL Ratio: 2.4 RATIO
Triglycerides: 66 mg/dL (ref <150)
VLDL: 13 mg/dL (ref 0–40)

## 2019-07-26 LAB — HEMOGLOBIN A1C
Hgb A1c MFr Bld: 5.2 % (ref 4.8–5.6)
Mean Plasma Glucose: 102.54 mg/dL

## 2019-07-26 LAB — ECHOCARDIOGRAM COMPLETE
Height: 61 in
Weight: 1764.8 oz

## 2019-07-26 MED ORDER — ATORVASTATIN CALCIUM 20 MG PO TABS
40.0000 mg | ORAL_TABLET | Freq: Every day | ORAL | Status: DC
  Administered 2019-07-27: 40 mg via ORAL
  Filled 2019-07-26: qty 2

## 2019-07-26 MED ORDER — LORAZEPAM 2 MG/ML IJ SOLN
0.5000 mg | Freq: Once | INTRAMUSCULAR | Status: AC
  Administered 2019-07-26: 0.5 mg via INTRAVENOUS
  Filled 2019-07-26: qty 1

## 2019-07-26 MED ORDER — ASPIRIN EC 81 MG PO TBEC: 81.0000 mg | DELAYED_RELEASE_TABLET | Freq: Every day | ORAL | Status: DC

## 2019-07-26 NOTE — Progress Notes (Signed)
SLP Cancellation Note  Patient Details Name: Gabrielle Crosby MRN: [id number] DOB: 1932/12/19   Cancelled treatment:       Reason Eval/Treat Not Completed: Patient not medically ready;Medical issues which prohibited therapy;Fatigue/lethargy limiting ability to participate(chart reviewed; consulted NSG).  Pt is lethargic w/ only stirring of hands and turning of head to mod-max verbal/tactile stim by this SLP. Pt seemed easily agitated w/ the stimulation. Eyes remained closed. Pt has a Actuary in the room. NSG reported similar behavior all morning.  Pt is not safe for po trials or BSE at this time d/t lethargy and poor alertness. ST services will f/u w/ BSE tomorrow hopefully. Recommend oral care when awake; aspiration precautions. NSG agreed w/ above.       Orinda Kenner, MS, CCC-SLP Watson,Katherine 07/26/2019, 1:17 PM

## 2019-07-26 NOTE — Progress Notes (Signed)
[facility] [place] NAME: Gabrielle Crosby    MR#:  [id number]  DATE OF BIRTH:  Jun 05, 1933  SUBJECTIVE:  CHIEF COMPLAINT: Patient is arousable but not answering any questions  REVIEW OF SYSTEMS:  Unobtainable in view of dementia  DRUG ALLERGIES:   Allergies  Allergen Reactions  . Atenolol Other (See Comments)    Bradycardia   . Donepezil Hcl Other (See Comments)    Mental changes, "made me crazy"    VITALS:  Blood pressure (!) 150/81, pulse 86, temperature 98 F (36.7 C), temperature source Axillary, resp. rate 16, height 5\' 1"  (1.549 m), weight 50 kg, SpO2 100 %.  PHYSICAL EXAMINATION:  GENERAL:  85 y.o.-year-old patient lying in the bed with no acute distress.  EYES: Pupils equal, round, reactive to light and accommodation. No scleral icterus. Extraocular muscles intact.  HEENT: Head atraumatic, normocephalic. Oropharynx and nasopharynx clear.  NECK:  Supple, no jugular venous distention. No thyroid enlargement, no tenderness.  LUNGS: Normal breath sounds bilaterally, no wheezing, rales,rhonchi or crepitation. No use of accessory muscles of respiration.  CARDIOVASCULAR: S1, S2 normal. No murmurs, rubs, or gallops.  ABDOMEN: Soft, nontender, nondistended. Bowel sounds present.  EXTREMITIES: No pedal edema, cyanosis, or clubbing.  NEUROLOGIC:  disoriented  pSYCHIATRIC: The patient is arousable and disoriented SKIN: No obvious rash, lesion, or ulcer.    LABORATORY PANEL:   CBC Recent Labs  Lab 07/25/19 1922  WBC 6.3  HGB 13.6  HCT 42.1  PLT 260   ------------------------------------------------------------------------------------------------------------------  Chemistries  Recent Labs  Lab 07/25/19 1922  NA 139  K 3.9  CL 105  CO2 25  GLUCOSE 105*  BUN 14  CREATININE 0.84  CALCIUM 9.2  AST 19  ALT 12  ALKPHOS 71  BILITOT 0.6    ------------------------------------------------------------------------------------------------------------------  Cardiac Enzymes No results for input(s): TROPONINI in the last 168 hours. ------------------------------------------------------------------------------------------------------------------  RADIOLOGY:  Ct Angio Head W Or Wo Contrast  Result Date: 07/25/2019 CLINICAL DATA:  Vascular dementia and Alzheimer's dementia. Left facial droop. Difficulty swallowing. Symptoms began 1800 hours EXAM: CT ANGIOGRAPHY HEAD AND NECK CT PERFUSION BRAIN TECHNIQUE: Multidetector CT imaging of the head and neck was performed using the standard protocol during bolus administration of intravenous contrast. Multiplanar CT image reconstructions and MIPs were obtained to evaluate the vascular anatomy. Carotid stenosis measurements (when applicable) are obtained utilizing NASCET criteria, using the distal internal carotid diameter as the denominator. Multiphase CT imaging of the brain was performed following IV bolus contrast injection. Subsequent parametric perfusion maps were calculated using RAPID software. CONTRAST:  155mL OMNIPAQUE IOHEXOL 350 MG/ML SOLN COMPARISON:  Head CT earlier same day.  MRI 01/14/2018 FINDINGS: CTA NECK FINDINGS Aortic arch: Aortic atherosclerosis. No aneurysm or dissection. Branching pattern is normal. Right carotid system: Common carotid artery widely patent to the bifurcation region. Calcified plaque at the carotid bifurcation and ICA bulb but no stenosis. Cervical ICA widely patent to the upper cervical region. There is irregularity of the vessel at the C1 level consistent with fibromuscular change. Left carotid system: Common carotid artery widely patent to the bifurcation. Calcified plaque at the carotid bifurcation. No stenosis. Cervical ICA is widely patent, with some irregularity in the upper cervical region related to fibromuscular change. Vertebral arteries: No vertebral  origin stenosis. Both vertebral arteries appear widely patent through the cervical region to the C1 level, where there is mild irregularity consistent with fibromuscular disease. Skeleton: Ordinary cervical spondylosis. Other neck: No mass or adenopathy.  Upper chest: Mild emphysema and pulmonary scarring. Review of the MIP images confirms the above findings CTA HEAD FINDINGS Anterior circulation: Both internal carotid arteries are patent through the skull base and siphon regions. The anterior and middle cerebral vessels do not show proximal stenosis or occlusion. There is no M1 or M2 stenosis. I can not identify a missing M3 branch on either side. Posterior circulation: Both vertebral arteries are widely patent to the basilar. No basilar stenosis. Posterior circulation branch vessels are patent. Venous sinuses: Patent and normal. Anatomic variants: None significant. Review of the MIP images confirms the above findings CT Brain Perfusion Findings: ASPECTS: 10 CBF (<30%) Volume: 13mL Perfusion (Tmax>6.0s) volume: 14mL Mismatch Volume: 21mL Infarction Location:No infarction identified. Right frontoparietal junction region changes consistent with ischemia in a right M3 branch distribution. IMPRESSION: Ordinary atherosclerosis of the arch and neck vessels without stenosis. Irregularity of the internal carotid arteries and vertebral arteries in the upper neck consistent with fibromuscular disease but without evidence of flow limiting stenosis. No infarction demonstrated by perfusion study. Ischemic change in the right frontoparietal junction region consistent with right M3 branch vessel insult. I can not specifically identify that by CT angiography. Volume measures 8 cc. These results were called by telephone at the time of interpretation on 07/25/2019 at 8:31 pm to Dr. Jimmye Norman, who verbally acknowledged these results. Electronically Signed   By: Nelson Chimes M.D.   On: 07/25/2019 20:34   Ct Angio Neck W Or Wo  Contrast  Result Date: 07/25/2019 CLINICAL DATA:  Vascular dementia and Alzheimer's dementia. Left facial droop. Difficulty swallowing. Symptoms began 1800 hours EXAM: CT ANGIOGRAPHY HEAD AND NECK CT PERFUSION BRAIN TECHNIQUE: Multidetector CT imaging of the head and neck was performed using the standard protocol during bolus administration of intravenous contrast. Multiplanar CT image reconstructions and MIPs were obtained to evaluate the vascular anatomy. Carotid stenosis measurements (when applicable) are obtained utilizing NASCET criteria, using the distal internal carotid diameter as the denominator. Multiphase CT imaging of the brain was performed following IV bolus contrast injection. Subsequent parametric perfusion maps were calculated using RAPID software. CONTRAST:  134mL OMNIPAQUE IOHEXOL 350 MG/ML SOLN COMPARISON:  Head CT earlier same day.  MRI 01/14/2018 FINDINGS: CTA NECK FINDINGS Aortic arch: Aortic atherosclerosis. No aneurysm or dissection. Branching pattern is normal. Right carotid system: Common carotid artery widely patent to the bifurcation region. Calcified plaque at the carotid bifurcation and ICA bulb but no stenosis. Cervical ICA widely patent to the upper cervical region. There is irregularity of the vessel at the C1 level consistent with fibromuscular change. Left carotid system: Common carotid artery widely patent to the bifurcation. Calcified plaque at the carotid bifurcation. No stenosis. Cervical ICA is widely patent, with some irregularity in the upper cervical region related to fibromuscular change. Vertebral arteries: No vertebral origin stenosis. Both vertebral arteries appear widely patent through the cervical region to the C1 level, where there is mild irregularity consistent with fibromuscular disease. Skeleton: Ordinary cervical spondylosis. Other neck: No mass or adenopathy. Upper chest: Mild emphysema and pulmonary scarring. Review of the MIP images confirms the above  findings CTA HEAD FINDINGS Anterior circulation: Both internal carotid arteries are patent through the skull base and siphon regions. The anterior and middle cerebral vessels do not show proximal stenosis or occlusion. There is no M1 or M2 stenosis. I can not identify a missing M3 branch on either side. Posterior circulation: Both vertebral arteries are widely patent to the basilar. No basilar stenosis. Posterior circulation branch vessels  are patent. Venous sinuses: Patent and normal. Anatomic variants: None significant. Review of the MIP images confirms the above findings CT Brain Perfusion Findings: ASPECTS: 10 CBF (<30%) Volume: 72mL Perfusion (Tmax>6.0s) volume: 44mL Mismatch Volume: 67mL Infarction Location:No infarction identified. Right frontoparietal junction region changes consistent with ischemia in a right M3 branch distribution. IMPRESSION: Ordinary atherosclerosis of the arch and neck vessels without stenosis. Irregularity of the internal carotid arteries and vertebral arteries in the upper neck consistent with fibromuscular disease but without evidence of flow limiting stenosis. No infarction demonstrated by perfusion study. Ischemic change in the right frontoparietal junction region consistent with right M3 branch vessel insult. I can not specifically identify that by CT angiography. Volume measures 8 cc. These results were called by telephone at the time of interpretation on 07/25/2019 at 8:31 pm to Dr. Jimmye Norman, who verbally acknowledged these results. Electronically Signed   By: Nelson Chimes M.D.   On: 07/25/2019 20:34   Mr Brain Wo Contrast  Result Date: 07/26/2019 CLINICAL DATA:  Vascular dementia and Alzheimer's dementia. Stroke. Left facial droop. EXAM: MRI HEAD WITHOUT CONTRAST TECHNIQUE: Multiplanar, multiecho pulse sequences of the brain and surrounding structures were obtained without intravenous contrast. COMPARISON:  CTA and CT perfusion 07/25/2019.  MRI brain 01/14/2018 FINDINGS: Brain:  Acute infarct in the right posterior frontal cortex as well as a small acute infarct in the operculum on the right. This corresponds to the area of delayed perfusion ischemia on CT perfusion yesterday. Small acute infarct right frontal white matter. Moderate atrophy with ventricular enlargement, stable. Chronic infarct left occipital lobe. Chronic ischemic changes in the white matter have progressed since 01/14/2018. Progression of multiple areas of chronic microhemorrhage in the brain in the right frontal lobe and right convexity. There is chronic hemorrhage over the right convexity, question history of head injury and subarachnoid hemorrhage. Vascular: Normal arterial flow voids. Skull and upper cervical spine: Negative Sinuses/Orbits: Bilateral cataract surgery. Paranasal sinuses clear. Other: None IMPRESSION: 1. Acute infarct right posterior frontal cortex and adjacent white matter. Small area of acute infarct in the operculum on the right. 2. Moderate atrophy. Progressive chronic ischemic changes since 01/14/2018. Progression of multiple areas of chronic microhemorrhage in the right cerebral hemisphere as well as chronic subarachnoid hemorrhage over the convexity in the right. Question interval head injury. Electronically Signed   By: Franchot Gallo M.D.   On: 07/26/2019 10:38   US Carotid Bilateral (at [facility] And [facility] Only)  Result Date: 07/26/2019 CLINICAL DATA:  Facial droop.  Hypertension, hyperlipidemia. EXAM: BILATERAL CAROTID DUPLEX ULTRASOUND TECHNIQUE: Pearline Cables scale imaging, color Doppler and duplex ultrasound were performed of bilateral carotid and vertebral arteries in the neck. COMPARISON:  CT 07/25/2019 FINDINGS: Criteria: Quantification of carotid stenosis is based on velocity parameters that correlate the residual internal carotid diameter with NASCET-based stenosis levels, using the diameter of the distal internal carotid lumen as the denominator for stenosis measurement. The following velocity  measurements were obtained: RIGHT ICA: 85/13 cm/sec CCA: [id number] cm/sec SYSTOLIC ICA/CCA RATIO:  1.3 ECA: 71 cm/sec LEFT ICA: 97/17 cm/sec CCA: [id number] cm/sec SYSTOLIC ICA/CCA RATIO:  1.6 ECA: 77 cm/sec RIGHT CAROTID ARTERY: Mild tortuosity. Intimal thickening through the common carotid artery. Partially calcified plaque at the carotid bifurcation and ICA origin without high-grade stenosis. Normal waveforms and color Doppler signal. RIGHT VERTEBRAL ARTERY:  Normal flow direction and waveform. LEFT CAROTID ARTERY: Intimal thickening through the common carotid artery. Mild plaque in the proximal internal and external carotid arteries without high-grade stenosis. Normal waveforms  and color Doppler signal. LEFT VERTEBRAL ARTERY:  Normal flow direction and waveform. IMPRESSION: 1. Bilateral carotid bifurcation plaque resulting in less than 50% diameter ICA stenosis. 2. Antegrade bilateral vertebral arterial flow. Electronically Signed   By: Lucrezia Europe M.D.   On: 07/26/2019 10:37   Ct Cerebral Perfusion W Contrast  Result Date: 07/25/2019 CLINICAL DATA:  Vascular dementia and Alzheimer's dementia. Left facial droop. Difficulty swallowing. Symptoms began 1800 hours EXAM: CT ANGIOGRAPHY HEAD AND NECK CT PERFUSION BRAIN TECHNIQUE: Multidetector CT imaging of the head and neck was performed using the standard protocol during bolus administration of intravenous contrast. Multiplanar CT image reconstructions and MIPs were obtained to evaluate the vascular anatomy. Carotid stenosis measurements (when applicable) are obtained utilizing NASCET criteria, using the distal internal carotid diameter as the denominator. Multiphase CT imaging of the brain was performed following IV bolus contrast injection. Subsequent parametric perfusion maps were calculated using RAPID software. CONTRAST:  170mL OMNIPAQUE IOHEXOL 350 MG/ML SOLN COMPARISON:  Head CT earlier same day.  MRI 01/14/2018 FINDINGS: CTA NECK FINDINGS Aortic arch: Aortic  atherosclerosis. No aneurysm or dissection. Branching pattern is normal. Right carotid system: Common carotid artery widely patent to the bifurcation region. Calcified plaque at the carotid bifurcation and ICA bulb but no stenosis. Cervical ICA widely patent to the upper cervical region. There is irregularity of the vessel at the C1 level consistent with fibromuscular change. Left carotid system: Common carotid artery widely patent to the bifurcation. Calcified plaque at the carotid bifurcation. No stenosis. Cervical ICA is widely patent, with some irregularity in the upper cervical region related to fibromuscular change. Vertebral arteries: No vertebral origin stenosis. Both vertebral arteries appear widely patent through the cervical region to the C1 level, where there is mild irregularity consistent with fibromuscular disease. Skeleton: Ordinary cervical spondylosis. Other neck: No mass or adenopathy. Upper chest: Mild emphysema and pulmonary scarring. Review of the MIP images confirms the above findings CTA HEAD FINDINGS Anterior circulation: Both internal carotid arteries are patent through the skull base and siphon regions. The anterior and middle cerebral vessels do not show proximal stenosis or occlusion. There is no M1 or M2 stenosis. I can not identify a missing M3 branch on either side. Posterior circulation: Both vertebral arteries are widely patent to the basilar. No basilar stenosis. Posterior circulation branch vessels are patent. Venous sinuses: Patent and normal. Anatomic variants: None significant. Review of the MIP images confirms the above findings CT Brain Perfusion Findings: ASPECTS: 10 CBF (<30%) Volume: 50mL Perfusion (Tmax>6.0s) volume: 23mL Mismatch Volume: 56mL Infarction Location:No infarction identified. Right frontoparietal junction region changes consistent with ischemia in a right M3 branch distribution. IMPRESSION: Ordinary atherosclerosis of the arch and neck vessels without stenosis.  Irregularity of the internal carotid arteries and vertebral arteries in the upper neck consistent with fibromuscular disease but without evidence of flow limiting stenosis. No infarction demonstrated by perfusion study. Ischemic change in the right frontoparietal junction region consistent with right M3 branch vessel insult. I can not specifically identify that by CT angiography. Volume measures 8 cc. These results were called by telephone at the time of interpretation on 07/25/2019 at 8:31 pm to Dr. Jimmye Norman, who verbally acknowledged these results. Electronically Signed   By: Nelson Chimes M.D.   On: 07/25/2019 20:34   Ct Head Code Stroke Wo Contrast  Result Date: 07/25/2019 CLINICAL DATA:  Code stroke. Vascular dementia and Alzheimer's dementia. Left facial droop and speech disturbance beginning after 1700 hours EXAM: CT HEAD WITHOUT CONTRAST TECHNIQUE:  Contiguous axial images were obtained from the base of the skull through the vertex without intravenous contrast. COMPARISON:  01/14/2018.  12/13/2014. FINDINGS: Brain: Brainstem and cerebellum are unremarkable. Cerebral hemispheres show advanced generalized atrophy with temporal predominance. Chronic small-vessel ischemic changes throughout the cerebral hemispheric deep and subcortical white matter. Old left parieto-occipital cortical infarction. Small old left frontal infarction. No sign of acute infarction, mass lesion, hemorrhage, hydrocephalus or extra-axial collection. Vascular: There is atherosclerotic calcification of the major vessels at the base of the brain. Skull: Negative Sinuses/Orbits: Clear/normal Other: None ASPECTS ([place] Stroke Program Early CT Score) - Ganglionic level infarction (caudate, lentiform nuclei, internal capsule, insula, M1-M3 cortex): 7 - Supraganglionic infarction (M4-M6 cortex): 3 Total score (0-10 with 10 being normal): 10 IMPRESSION: 1. No acute finding by CT. Brain atrophy with temporal lobe predominance. Extensive chronic  small-vessel ischemic changes throughout the cerebral hemispheres. Old left frontal and parietooccipital infarctions. 2. ASPECTS is 10. 3. These results were called by telephone at the time of interpretation on 07/25/2019 at 7:16 pm to Dr. Lenise Arena , who verbally acknowledged these results. Electronically Signed   By: Nelson Chimes M.D.   On: 07/25/2019 19:19    EKG:   Orders placed or performed during the hospital encounter of 07/25/19  . ED EKG  . ED EKG  . EKG 12-Lead  . EKG 12-Lead    ASSESSMENT AND PLAN:     # acute right frontal infarct CT head negative MRI positive acute right frontal infarct Aspirin 81 mg enteric-coated daily at home and statin TEE pending Seen by neurology appreciate Dr. Hart [place] recommendations PT is recommending SNF Monitor patient on telemetry -N.p.o. awaiting for swallow evaluation  #Essential hypertension Allow permissive hypertension currently patient is n.p.o.  #Hyperlipidemia-Lipitor 40 mg will be started after patient is tolerating p.o.  #Alzheimer's and vascular dementia Resume home medications after patient passes bedside swallow evaluation  #GERD-PPI  Disposition SNF.  Daughter is agreeable  All the records are reviewed and case discussed with Care Management/Social Workerr. Management plans discussed with the patient started by neurology  CODE STATUS:  TOTAL TIME TAKING CARE OF THIS PATIENT: 35  minutes.   POSSIBLE D/C IN 1-2  DAYS, DEPENDING ON CLINICAL CONDITION.  Note: This dictation was prepared with Dragon dictation along with smaller phrase technology. Any transcriptional errors that result from this process are unintentional.   Nicholes Mango M.D on 07/26/2019 at 4:05 PM  Between 7am to 6pm - Pager - [phone] After 6pm go to [email or web address] - password [email or web address] [facility]  Office  [phone]  CC: Primary care physician; Toni Arthurs, NP

## 2019-07-26 NOTE — Progress Notes (Addendum)
Patient agitated and anxious. Pulling a telemetry leads and tubes. Trying to get out of bed even with sitter at bedside. Messaged MD new order for Ativan 0.5mg  once.

## 2019-07-26 NOTE — NC FL2 (Signed)
[place] LEVEL OF CARE SCREENING TOOL     IDENTIFICATION  Patient Name: Gabrielle Crosby Birthdate: [birth year] Sex: female Admission Date (Current Location): 07/25/2019  [place] and [place] Number:  Engineering geologist and Address:  [facility], [address], [place], [place] [address]      Provider Number: [id number]  Attending Physician Name and Address:  Nicholes Mango, MD  Relative Name and Phone Number:       Current Level of Care: Hospital Recommended Level of Care: [place] Prior Approval Number:    Date Approved/Denied:   PASRR Number: [id number]  Discharge Plan: SNF    Current Diagnoses: Patient Active Problem List   Diagnosis Date Noted  . HLD (hyperlipidemia) 07/25/2019  . GERD (gastroesophageal reflux disease) 07/25/2019  . Facial droop 07/25/2019  . Loss of memory 07/26/2016  . Anxiety 06/26/2016  . Bradycardia 01/02/2015  . H/O deep venous thrombosis 01/02/2015  . MI (mitral incompetence) 01/02/2015  . Near syncope 01/02/2015  . Malignant neoplasm of urinary bladder ([place]) 08/22/2014  . Mixed Alzheimer's and vascular dementia ([place]) 08/22/2014  . Essential (primary) hypertension 08/22/2014  . Adaptive colitis 08/22/2014  . Lacunar infarction ([place]) 08/22/2014  . Combined fat and carbohydrate induced hyperlipemia 08/22/2014  . Allergic rhinitis, seasonal 08/22/2014    Orientation RESPIRATION BLADDER Height & Weight     Self  Normal Continent Weight: 110 lb 4.8 oz (50 kg) Height:  5\' 1"  (154.9 cm)  BEHAVIORAL SYMPTOMS/MOOD NEUROLOGICAL BOWEL NUTRITION STATUS      Continent Diet(Diet: NPO to be advanced.)  AMBULATORY STATUS COMMUNICATION OF NEEDS Skin   Extensive Assist Verbally Normal                       Personal Care Assistance Level of Assistance  Bathing, Feeding, Dressing Bathing Assistance: Limited assistance Feeding assistance: Limited assistance Dressing Assistance:  Limited assistance     Functional Limitations Info  Sight, Hearing, Speech Sight Info: Adequate Hearing Info: Adequate Speech Info: Adequate    SPECIAL CARE FACTORS FREQUENCY  PT (By licensed PT), OT (By licensed OT)     PT Frequency: 5 OT Frequency: 5            Contractures      Additional Factors Info  Code Status, Allergies Code Status Info: Full Code. Allergies Info: Atenolol, Donepezil Hcl           Current Medications (07/26/2019):  This is the current hospital active medication list Current Facility-Administered Medications  Medication Dose Route Frequency Provider Last Rate Last Dose  . acetaminophen (TYLENOL) tablet 650 mg  650 mg Oral Q4H PRN Lance Coon, MD       Or  . acetaminophen (TYLENOL) solution 650 mg  650 mg Per Tube Q4H PRN Lance Coon, MD       Or  . acetaminophen (TYLENOL) suppository 650 mg  650 mg Rectal Q4H PRN Lance Coon, MD      . aspirin EC tablet 81 mg  81 mg Oral Daily Vu, Talbert Cage, MD      . atorvastatin (LIPITOR) tablet 40 mg  40 mg Oral q1800 Vu, Talbert Cage, MD      . enoxaparin (LOVENOX) injection 40 mg  40 mg Subcutaneous Q24H Lance Coon, MD   40 mg at 07/26/19 0106  . sodium chloride flush (NS) 0.9 % injection 3 mL  3 mL Intravenous Once Lance Coon, MD  Discharge Medications: Please see discharge summary for a list of discharge medications.  Relevant Imaging Results:  Relevant Lab Results:   Additional Information SSN: [id number]  Muhannad Bignell, Veronia Beets, LCSW

## 2019-07-26 NOTE — Evaluation (Signed)
Physical Therapy Evaluation Patient Details Name: Gabrielle Crosby MRN: [id number] DOB: Dec 02, 1932 Today's Date: 07/26/2019   History of Present Illness  Pt is 85 yo female with PMH of bladder cancer, brain bleeds, dementia, depression, HLD, HTN, seizures, stroke, presented to ED for facial droop. MRI showed acute infarct right posterior frontal cortex and adjacent white matter. Small area of acute infarct in the operculum on the right.    Clinical Impression  Patient asleep, woken with tactile and verbal cues, though kept eyes closed throughout the rest of the session even with ambulation. Pt able to report her name is "Gabrielle Crosby", disoriented x3. No pain signs/symptoms with mobility. Pt unable to provide PLOF, PT spoke with family that reported that the pt lives with her daughter, needs assistance for stair navigation as well as ADLs, ambulates without AD though steps are slow, shuffled.   Difficulty assessing patient due to cognitive limitations at baseline, but was able to perform SLR and heel slides bilaterally, as well as verbalize to PT that she could feel light touch on all extremities appropriately/per location. Often spoke 2-3 words at a time. Able to minimally lift LUE, mobilized RUE well, often reaching outside base of support. Supine <> sit with minA and extensive tactile cues, pt exhibited fair sitting balance. No lateral/posterior lean noted, but may present with L inattention. Was able to turn her head to just past midline with extensive tactile cues. Sit <> stand and ambulation with min-modA & RW, posterior lean and difficulty understanding the task/use of RW.  Overall the patient demonstrated deficits (see "PT Problem List") that impede the patient's functional abilities, safety, and mobility and would benefit from skilled PT intervention. Recommendation is STR due to acute decline in functional status, pt may benefit long term from transition to long term care unit/memory care.        Follow Up Recommendations SNF    Equipment Recommendations  Rolling walker with 5" wheels    Recommendations for Other Services       Precautions / Restrictions Precautions Precautions: Fall Restrictions Weight Bearing Restrictions: No      Mobility  Bed Mobility Overal bed mobility: Needs Assistance Bed Mobility: Supine to Sit;Sit to Supine     Supine to sit: Min assist Sit to supine: Mod assist   General bed mobility comments: min-modA to perform task, difficult to assess true need of physical assist because of mental status  Transfers Overall transfer level: Needs assistance Equipment used: Rolling walker (2 wheeled) Transfers: Sit to/from Stand Sit to Stand: Mod assist         General transfer comment: pulls heavily on RW  Ambulation/Gait Ambulation/Gait assistance: Min assist;Mod assist Gait Distance (Feet): 3 Feet Assistive device: Rolling walker (2 wheeled)       General Gait Details: Pt difficulty understanding use of RW/navigating due to eyes closed. modA initially due to posterior lean noted, intermittently minA . extensive tactile and verbal cues to maximize pt participation  Stairs            Wheelchair Mobility    Modified Rankin (Stroke Patients Only)       Balance Overall balance assessment: Needs assistance Sitting-balance support: Single extremity supported Sitting balance-Leahy Scale: Fair Sitting balance - Comments: able to maintain with intermittent UE support, with and without feet supported, reaches outside base of support to R     Standing balance-Leahy Scale: Poor Standing balance comment: required min-modA to maintain standing  Pertinent Vitals/Pain Pain Assessment: Faces Faces Pain Scale: No hurt Pain Intervention(s): Limited activity within patient's tolerance;Monitored during session;Repositioned    Home Living Family/patient expects to be discharged to:: Private  residence Living Arrangements: Children Available Help at Discharge: Family Type of Home: House Home Access: Stairs to enter Entrance Stairs-Rails: Right;Left(dtr and son-in-law typically assist with arm in arm on either side for entrance steps.) Entrance Stairs-Number of Steps: daughter reported family assists bilaterally to help her on the stairs Home Layout: One level Home Equipment: Other (comment)(unknown available home equipment, but pt was amb w/o AD w/ shuffling gait per dtr.) Additional Comments: Per daughter pt ambulates at baseline without an AD, slow and shuffled gait, but does spend most of her day sitting    Prior Function Level of Independence: Needs assistance   Gait / Transfers Assistance Needed: supervision provided but previously did not need physical assist for household ambulation  ADL's / Homemaking Assistance Needed: family assists with transportation, getting groceries and all IADLs, pt requires some assistance with BADLs of bathing/dressing.        Hand Dominance   Dominant Hand: Right(Pt raises R hand when asked which hand she eats with)    Extremity/Trunk Assessment   Upper Extremity Assessment Upper Extremity Assessment: Defer to OT evaluation;Generalized weakness;RUE deficits/detail;LUE deficits/detail;Difficult to assess due to impaired cognition RUE Deficits / Details: able to reach and adjust bed sheets, grasp walker. Pt able to confirm when PT was touching R arm/where PT was touching LUE Deficits / Details: with extensive verbal and tactile cues, minimally able to raise LUE. Pt able to confirm when PT was touching L arm/where PT was touching    Lower Extremity Assessment Lower Extremity Assessment: RLE deficits/detail;LLE deficits/detail;Difficult to assess due to impaired cognition RLE Deficits / Details: able to perform SLR with tactile cues, heel slide. Able to identify when PT was touching her LE LLE Deficits / Details: able to perform SLR with  tactile cues, heel slide. Able to identify when PT was touching her LE    Cervical / Trunk Assessment Cervical / Trunk Assessment: Kyphotic  Communication   Communication: (1-2 words at time, eyes closed throughout session)  Cognition Arousal/Alertness: (variable alertness) Behavior During Therapy: WFL for tasks assessed/performed Overall Cognitive Status: History of cognitive impairments - at baseline                                 General Comments: Pt with minimal eye opening throughout assessment, slow processing noted and slow to answer simple yes/no questions. Pt able to follow simple one step commands most successful with tactile cues      General Comments      Exercises Other Exercises Other Exercises: OT facilitates education with pt, sitter and primarily RN who is present at end of session: pt curren fxl status and OT d/c recommendations with RN verbalizing understanding.   Assessment/Plan    PT Assessment Patient needs continued PT services  PT Problem List Decreased strength;Decreased mobility;Decreased safety awareness;Decreased activity tolerance;Decreased balance;Decreased knowledge of use of DME;Pain;Decreased knowledge of precautions       PT Treatment Interventions DME instruction;Therapeutic exercise;Gait training;Balance training;Stair training;Neuromuscular re-education;Functional mobility training;Therapeutic activities;Patient/family education    PT Goals (Current goals can be found in the Care Plan section)  Acute Rehab PT Goals Patient Stated Goal: to get strong PT Goal Formulation: Patient unable to participate in goal setting Time For Goal Achievement: 08/09/19 Potential to Achieve Goals:  Fair    Frequency 7X/week   Barriers to discharge        Co-evaluation               AM-PAC PT "6 Clicks" Mobility  Outcome Measure Help needed turning from your back to your side while in a flat bed without using bedrails?: A Lot Help  needed moving from lying on your back to sitting on the side of a flat bed without using bedrails?: A Lot Help needed moving to and from a bed to a chair (including a wheelchair)?: A Lot Help needed standing up from a chair using your arms (e.g., wheelchair or bedside chair)?: A Lot Help needed to walk in hospital room?: A Lot Help needed climbing 3-5 steps with a railing? : Total 6 Click Score: 11    End of Session Equipment Utilized During Treatment: Gait belt Activity Tolerance: Patient limited by fatigue;Other (comment)(limited by mental status) Patient left: in bed;with call bell/phone within reach;with nursing/sitter in room Nurse Communication: Mobility status PT Visit Diagnosis: Unsteadiness on feet (R26.81);Other abnormalities of gait and mobility (R26.89);Muscle weakness (generalized) (M62.81);Other symptoms and signs involving the nervous system [id number])    Time: [id number] PT Time Calculation (min) (ACUTE ONLY): 26 min   Charges:   PT Evaluation $PT Eval Moderate Complexity: 1 Mod PT Treatments $Therapeutic Exercise: 8-22 mins        Lieutenant Diego PT, DPT 3:15 PM,07/26/19 [phone]

## 2019-07-26 NOTE — Plan of Care (Signed)
  Problem: Education: Goal: Knowledge of secondary prevention will improve Outcome: Progressing Goal: Knowledge of patient specific risk factors addressed and post discharge goals established will improve Outcome: Progressing   Problem: Health Behavior/Discharge Planning: Goal: Ability to manage health-related needs will improve Outcome: Progressing   Problem: Education: Goal: Knowledge of General Education information will improve Description: Including pain rating scale, medication(s)/side effects and non-pharmacologic comfort measures Outcome: Progressing   Problem: Health Behavior/Discharge Planning: Goal: Ability to manage health-related needs will improve Outcome: Progressing   Problem: Clinical Measurements: Goal: Ability to maintain clinical measurements within normal limits will improve Outcome: Progressing Goal: Will remain free from infection Outcome: Progressing Goal: Diagnostic test results will improve Outcome: Progressing Goal: Respiratory complications will improve Outcome: Progressing Goal: Cardiovascular complication will be avoided Outcome: Progressing   Problem: Activity: Goal: Risk for activity intolerance will decrease Outcome: Progressing   Problem: Nutrition: Goal: Adequate nutrition will be maintained Outcome: Progressing   Problem: Coping: Goal: Level of anxiety will decrease Outcome: Progressing   Problem: Elimination: Goal: Will not experience complications related to bowel motility Outcome: Progressing Goal: Will not experience complications related to urinary retention Outcome: Progressing   Problem: Pain Managment: Goal: General experience of comfort will improve Outcome: Progressing   Problem: Safety: Goal: Ability to remain free from injury will improve Outcome: Progressing   Problem: Skin Integrity: Goal: Risk for impaired skin integrity will decrease Outcome: Progressing

## 2019-07-26 NOTE — Progress Notes (Signed)
*  PRELIMINARY RESULTS* Echocardiogram 2D Echocardiogram has been performed.  Gabrielle Crosby 07/26/2019, 11:19 AM

## 2019-07-26 NOTE — Progress Notes (Signed)
SLP Cancellation Note  Patient Details Name: Gabrielle Crosby MRN: [id number] DOB: 05/02/1933   Cancelled treatment:       Reason Eval/Treat Not Completed: Patient not medically ready(chart reviewed; consulted NSG). Per chart notes, Neurology stated: "Patient was last seen in clinic in July 2020 - "Mixed dementia with both Alzheimer's disease (cerebral atrophy and progressive verbal fluency impairment) + Vascular (evidenced by lacunar infarcts and chronic microvascular disease) - Increased combativeness, requires assistance with all activities of daily living." Seroquel was increased to 25 mg in the morning and 50 mg at night, per notes. Patient is being followed by Palliative Care services in the home; support for family. With this baseline and a new neurological event, recommend pt be followed for any change in her Cognitive status (from her baseline declined) once she is d/c'd to a home setting post this acute event - there is suspected decline in Dementia w/ any new events or illness. Recommend reducing distractions during engagement and speaking in simple sentences slowly; gestures if helpful. ST services will f/u w/ BSE tomorrow. Recommend oral care when awake; aspiration precautions. NSG agreed w/ above.       Orinda Kenner, [place], CCC-SLP Grayling Schranz 07/26/2019, 1:09 PM

## 2019-07-26 NOTE — Consult Note (Signed)
Neurology [place] at [facility]   Referring Physician: Nicholes Mango, MD  Chief Complaint/Reason for Consult:  I have been asked to see the patient in neurological consultation to render advice and opinion regarding stroke.  HPI: [name] is a 85 y.o.  female with a history significant for mixed dementia, HLD, HTN, reported seizure activity who presented with left facial droop. History is limited as patient has dementia. History was obtained from chart review.   Reportedly, family noted around dinner time that the patient had a left facial droop. It's not clear when this started. She presented to the ED as a stroke alert. It was noted that the patient had right gaze deviation and left sided weakness. NIHSS 17 at that time. CTA head/neck was performed given concerns for LVO. CTA imaging demonstrated no significant carotid stenosis and no LVO. CT perfusion was performed and it demonstrated ischemic changes in the right frontoparietal junction region. Patient was not a tPA candidate since LKN is not clear and as well as prior imaging demonstrating SAH.   MRI brain w/o confirmed an acute ischemic stroke in the right frontoparietal region. Of note, prior MRI back in 12/2017 demonstrated: "New small foci of remote peripheral/subarachnoid hemorrhage, question amyloid angiopathy."  Of note, patient is followed by Dr. Manuella Crosby with neurology. Patient was last seen in clinic in July 2020 - "Mixed dementia with both Alzheimer's disease (cerebral atrophy and progressive verbal fluency impairment) + Vascular (evidenced by lacunar infarcts and chronic microvascular disease) - Increased combativeness, requires assistance with all activities of daily living." At that time, her Seroquel was increased to 25 mg in the morning and 50 mg at night. She was advised to continue with her home ASA 81 mg daily, Namenda 10 mg BID and Celexa 20 mg QAM. In a prior note, there was discussion of  anticoagulation but it was advised against due to age and dementia.    Past Medical History:  Diagnosis Date  . Arthritis    hands  . Bladder cancer ([place])   . Brain bleed ([place]) 2019   d/t light strokes, high bp, seizures. brain has shrunk per dr. Manuella Crosby and mri  . Cancer ([place])    BLADDER X 2  . Dementia ([place]) 2019   dementia progressing  . Depression   . GERD (gastroesophageal reflux disease)    chokes easily even on own saliva and then has panic attack  . Hyperlipidemia   . Hypertension 2019   no medications.  no htn per daughter  . Seizures [facility])    family not aware of any seizures and no treatment  . Stroke [facility]) 2019   undiagnosed but per neuro, she has had them causing vascular dementia  . Wears dentures    partial upper and lower    Past Surgical History:  Procedure Laterality Date  . ABDOMINAL HYSTERECTOMY Bilateral    BILATERAL s&o  . BACK SURGERY    . BLADDER SURGERY N/A 2015   REMOVAL OF BLADDER CANCER X 2  . BOWEL RESECTION N/A 1999   SMALL BOWEL OBSTRUCTION  . CHOLECYSTECTOMY    . COLONOSCOPY N/A 11/06/2015   Procedure: COLONOSCOPY;  Surgeon: Gabrielle Luster, MD;  Location: [facility];  Service: Gastroenterology;  Laterality: N/A;  . CYSTOSCOPY WITH BIOPSY N/A 04/17/2015   Procedure: CYSTOSCOPY WITH BIOPSY;  Surgeon: Gabrielle Espy, MD;  Location: [facility];  Service: Urology;  Laterality: N/A;  . KYPHOPLASTY N/A 12/20/2017   Procedure: [id number];  Surgeon:  Gabrielle Knows, MD;  Location: [facility];  Service: Orthopedics;  Laterality: N/A;  . KYPHOPLASTY N/A 04/21/2018   Procedure: KYPHOPLASTY-L2,L3,L4,L5;  Surgeon: Gabrielle Knows, MD;  Location: [facility];  Service: Orthopedics;  Laterality: N/A;  . SACROPLASTY N/A 11/03/2018   Procedure: Dillard Cannon;  Surgeon: Gabrielle Knows, MD;  Location: [facility];  Service: Orthopedics;  Laterality: N/A;   Allergies  Allergen Reactions  . Atenolol Other (See Comments)    Bradycardia   . Donepezil Hcl Other (See  Comments)    Mental changes, "made me crazy"   Prior to Admission medications   Medication Sig Start Date End Date Taking? Authorizing Provider  acetaminophen (TYLENOL) 500 MG tablet Take 500 mg by mouth 2 (two) times daily.    Yes [provider]  aspirin EC 81 MG tablet Take 81 mg by mouth daily.   Yes [provider]  Cholecalciferol (VITAMIN D3) 50 MCG (2000 UT) capsule Take 2,000 Units by mouth daily.  05/10/19  Yes [provider]  citalopram (CELEXA) 20 MG tablet Take 20 mg by mouth daily.    Yes [provider]  docusate sodium (COLACE) 50 MG capsule Take 50 mg by mouth 2 (two) times daily as needed for mild constipation.   Yes [provider]  lovastatin (MEVACOR) 20 MG tablet Take 20 mg by mouth at bedtime.  05/27/15  Yes [provider]  Magnesium 100 MG CAPS Take 100 mg by mouth daily.    Yes [provider]  memantine (NAMENDA) 10 MG tablet Take 10 mg by mouth 2 (two) times daily.  11/05/16  Yes [provider]  QUEtiapine (SEROQUEL) 25 MG tablet Take 50 mg by mouth at bedtime.  05/10/19 05/09/20 Yes [provider]  sulfamethoxazole-trimethoprim (BACTRIM DS) 800-160 MG tablet Take 1 tablet by mouth 2 (two) times daily. 07/24/19  Yes Gabrielle Espy, MD  vitamin B-12 (CYANOCOBALAMIN) 1000 MCG tablet Take 1,000 mcg by mouth daily.   Yes [provider]   Family History  Family history unknown: Yes   Social History   Socioeconomic History  . Marital status: Married    Spouse name: Not on file  . Number of children: Not on file  . Years of education: Not on file  . Highest education level: Not on file  Occupational History  . Not on file  Social Needs  . Financial resource strain: Not on file  . Food insecurity    Worry: Not on file    Inability: Not on file  . Transportation needs    Medical: Not on file    Non-medical: Not on file  Tobacco Use  . Smoking status: Never Smoker  .  Smokeless tobacco: Never Used  Substance and Sexual Activity  . Alcohol use: Never    Alcohol/week: 0.0 standard drinks    Frequency: Never  . Drug use: No  . Sexual activity: Never  Lifestyle  . Physical activity    Days per week: Not on file    Minutes per session: Not on file  . Stress: Not on file  Relationships  . Social Herbalist on phone: Not on file    Gets together: Not on file    Attends religious service: Not on file    Active member of club or organization: Not on file    Attends meetings of clubs or organizations: Not on file    Relationship status: Not on file  . Intimate partner violence    Fear  of current or ex partner: Not on file    Emotionally abused: Not on file    Physically abused: Not on file    Forced sexual activity: Not on file  Other Topics Concern  . Not on file  Social History Narrative   Pt confused unable to answer questions    Continuous Infusions: Scheduled Meds:  . enoxaparin (LOVENOX) injection  40 mg Subcutaneous Q24H  . sodium chloride flush  3 mL Intravenous Once   PRN Meds: acetaminophen **OR** acetaminophen (TYLENOL) oral liquid 160 mg/5 mL **OR** acetaminophen   Review of Systems Review of systems not obtained due to patient factors.   Objective: Temp:  [97.6 F (36.4 C)-98.5 F (36.9 C)] 97.6 F (36.4 C) (08/26 0600) Pulse Rate:  [75-99] 80 (08/26 0600) Resp:  [13-22] 20 (08/26 0600) BP: (130-173)/(42-130) 152/80 (08/26 0600) SpO2:  [98 %-100 %] 99 % (08/26 0600) Weight:  [50 kg] 50 kg (08/25 2020) No intake or output data in the 24 hours ending 07/26/19 0816  Wt Readings from Last 3 Encounters:  07/25/19 50 kg  07/18/19 49 kg  11/03/18 48.1 kg      Physical Exam:  GENERAL:  No acute distress.  EYES:   Pupils: pupils equally round, reactive to light.  ENT:   Throat: oropharynx clear.  CARDIOVASCULAR: regular rate and rhythm.  RESPIRATORY:  normal work of breathing  GASTROINTESTINAL:   Abdomen: benign,  bowel sounds present.  SKIN:   Inspection: well perfused, no edema.   MENTAL STATUS EXAM:    Orientation: Drowsy but wakes up to vocal stimulation. Able to tell me her name. Does not know where she is or what year it is. Cooperative, follows one-step commands. Language: Speech is dyarthric and language comprehension is limited but appears to be intact.   CRANIAL NERVES:    CN 2 (Optic): Visual fields intact to confrontation CN 3,4,6 (EOM): Pupils equal and reactive to light. Full extraocular eye movement with oculocephalic maneuver. Does appear to have a slight right gaze preference  CN 5 (Trigeminal): Facial sensation is norma  CN 7 (Facial): LEFT lower facial weakness  CN 8 (Auditory): Auditory acuity grossly normal.  CN 9,10 (Glossophar): The uvula is midline, the palate elevates symmetrically.  CN 11 (spinal access): Normal sternocleidomastoid and trapezius strength.  CN 12 (Hypoglossal): The tongue is midline. No atrophy or fasciculations.Marland Kitchen   MOTOR:  Muscle Strength: Strength - Moving all four extremities at least anti-gravity. Drifts in all four extremities but likely due to not being able to follow complex commands.  Muscle Tone: Tone and muscle bulk are normal in the upper and lower extremities.   REFLEXES: DTRs - 1+ and symmetrical in all four extremities, plantar responses are muted bilaterally.   COORDINATION:   Unable to do finger-to-nose and heel-to-shin.    SENSATION: Withdraws to painful stimuli in all four extremities   GAIT: Deferred   Labs: Recent Labs  Lab 07/25/19 1922  WBC 6.3  HGB 13.6  HCT 42.1  PLT 260  MCV 95.5   LDL 79  HDL 65  Recent Labs  Lab 07/25/19 1922  NA 139  K 3.9  CL 105  CO2 25  BUN 14  CREATININE 0.84  CALCIUM 9.2      Significant Diagnostic Studies: All images independently visualized.    Impression: [name] is a 85 y.o.  female with the above history who presents with right gaze deviation and left sided  deficits and was found to  have a right frontoparietal infarct. CTA head/neck demonstrated no LVO or significant carotid stenosis. TTE is pending. Updated the patient's daughter on the results. Daughter was told that the patient was told to not be on any anticoagulation due to prior [facility] history. Patient is currently on ASA 81 mg daily at home and her daughter manages all of her medications. Of note, patient's daughter stated that for a while now, family has been planning on placing the patient in an assisted living facility but she is afraid that after this event, patient will most likely be in a nursing facility. I've informed her that our PT/OT team will evaluate the patient and at which point, we can discuss about what options she has upon discharge.   Principal Problem:   Facial droop Active Problems:   Mixed Alzheimer's and vascular dementia ([place])   Essential (primary) hypertension   HLD (hyperlipidemia)   GERD (gastroesophageal reflux disease)  Recommendations: - Continue with home ASA 81 mg daily  - Add low dose statin for LDL goal < 70 - Follow up onTTE  - Agree with getting PT/OT/ST - Continue with telemetry to look for underlying afib   It's been a pleasure participating in this patient's care. Please don't hesitate to call with any further questions or concerns.   Laurene Footman, MD Neurology

## 2019-07-26 NOTE — Evaluation (Signed)
Occupational Therapy Evaluation Patient Details Name: Gabrielle Crosby MRN: [id number] DOB: January 02, 1933 Today's Date: 07/26/2019    History of Present Illness Pt is 85 y/o F who presented to ED with her daughter d/t new onset facial droop (MRI confirmed acute infarct). PMH includes vascular dementia, alzheimer's, arthritis, HTN, HLD, and bladder CA.   Clinical Impression   Pt seen for OT evaluation this date. Prior to hospital admission, pt was requiring assist from dtr and son-in-law for transportation, assistance with some higher level BADLs such as bathing/dressing, and requiring b/l arm in arm assist to negotiate steps to enter the home.  Pt lives in single story home with her dtr and son-in-law.  Currently pt demonstrates impairments in strength (L>R UE), general activity tolerance, and decreased cognitive status with minimal eye opening and L inattention. Pt currently requiring MOD A for bed mobility (sup<>sit), MAX A for LB ADLs assist for, and minimal tolerance for EOB sitting d/t fatigue/lethargy. Pt would benefit from skilled OT to address noted impairments and functional limitations (see below for any additional details) in order to maximize safety and independence while minimizing falls risk and caregiver burden.  Upon hospital discharge, recommend pt discharge to SNF.     Follow Up Recommendations  SNF    Equipment Recommendations  (defer to next level of care)    Recommendations for Other Services       Precautions / Restrictions Precautions Precautions: Fall Restrictions Weight Bearing Restrictions: No      Mobility Bed Mobility Overal bed mobility: Needs Assistance Bed Mobility: Supine to Sit;Sit to Supine     Supine to sit: Mod assist Sit to supine: Mod assist   General bed mobility comments: to manage LEs and provide tactile/verbal cues. Pt demos good core control and sits up UB with MOD I.  Transfers                 General transfer comment:  transfers deferred at this time, pt too fatigued following PT assessment prior to OT eval.    Balance Overall balance assessment: Needs assistance Sitting-balance support: Single extremity supported Sitting balance-Leahy Scale: Fair Sitting balance - Comments: sitting EOB  pt demos G static sitting, F with reaching outside BOS requiring increased assistance.       Standing balance comment: standing not assessed at this time d/t fatigue and minimal eye opening                           ADL either performed or assessed with clinical judgement   ADL Overall ADL's : Needs assistance/impaired Eating/Feeding: Minimal assistance;Bed level Eating/Feeding Details (indicate cue type and reason): unable to formall assess as pt is NPO at this time, pt demos ability to complete hand to mouth, but would require setup d/t L inattention, decreased cognition, and decreased fine motor coordination/strength bilaterally Grooming: Wash/dry hands;Wash/dry face;Oral care;Minimal assistance   Upper Body Bathing: Minimal assistance   Lower Body Bathing: Moderate assistance;Maximal assistance;Bed level   Upper Body Dressing : Minimal assistance;Moderate assistance;Sitting   Lower Body Dressing: Maximal assistance;Sitting/lateral leans                       Vision Baseline Vision/History: Wears glasses Wears Glasses: At all times Patient Visual Report: No change from baseline Additional Comments: difficult to assess at this time as pt with minimal eye opening     Perception     Praxis  Pertinent Vitals/Pain Pain Assessment: Faces Faces Pain Scale: No hurt     Hand Dominance Right(per pt report, ?? historian)   Extremity/Trunk Assessment Upper Extremity Assessment Upper Extremity Assessment: RUE deficits/detail;LUE deficits/detail RUE Deficits / Details: 4-/5 shoulder/elbow flex/ext and grip LUE Deficits / Details: grossly 3-/5 shoulder flex/ext, 3+/5 elbow flex/ext and  grip << difficult to assess L side, pt with L inattention and requiring increased cueing to engage L UE.   Lower Extremity Assessment Lower Extremity Assessment: Defer to PT evaluation   Cervical / Trunk Assessment Cervical / Trunk Assessment: Kyphotic   Communication Communication Communication: No difficulties(while pt is able to answer basic yes/no questions, she is very slow to answer, demos minimal eye opening.)   Cognition Arousal/Alertness: Lethargic Behavior During Therapy: WFL for tasks assessed/performed Overall Cognitive Status: History of cognitive impairments - at baseline                                 General Comments: Pt with minimal eye opening throughout assessment, slow processing noted and slow to answer simple yes/no questions. Pt able to follow simple one step commands with MIN/MOD verbal/tactile cues.   General Comments       Exercises Other Exercises Other Exercises: OT facilitates education with pt, sitter and primarily RN who is present at end of session: pt curren fxl status and OT d/c recommendations with RN verbalizing understanding.   Shoulder Instructions      Home Living Family/patient expects to be discharged to:: Private residence Living Arrangements: Children Available Help at Discharge: Family Type of Home: House Home Access: Stairs to enter   Entrance Stairs-Rails: Right;Left(dtr and son-in-law typically assist with arm in arm on either side for entrance steps.) Home Layout: One level               Home Equipment: Other (comment)(unknown available home equipment, but pt was amb w/o AD w/ shuffling gait per dtr.)          Prior Functioning/Environment Level of Independence: Needs assistance  Gait / Transfers Assistance Needed: can complete fxl mobility on flat surface with no assist at baseline, but required arm in arm bilaterally to negotiate steps. ADL's / Homemaking Assistance Needed: family assists with  transportation, getting groceries and all IADLs, pt requires some assistance with BADLs of bathing/dressing. Communication / Swallowing Assistance Needed: Pt with difficulty holding conversationg at baseline, but family reports pt able to communicate needs.          OT Problem List: Decreased strength;Decreased range of motion;Decreased activity tolerance;Impaired balance (sitting and/or standing);Decreased coordination;Impaired vision/perception;Decreased cognition;Decreased safety awareness;Decreased knowledge of use of DME or AE;Decreased knowledge of precautions      OT Treatment/Interventions:      OT Goals(Current goals can be found in the care plan section) Acute Rehab OT Goals Patient Stated Goal: to get strong OT Goal Formulation: With patient Time For Goal Achievement: 08/09/19 Potential to Achieve Goals: Good  OT Frequency: Min 1X/week   Barriers to D/C:            Co-evaluation              AM-PAC OT "6 Clicks" Daily Activity     Outcome Measure Help from another person eating meals?: A Little Help from another person taking care of personal grooming?: A Little Help from another person toileting, which includes using toliet, bedpan, or urinal?: A Lot Help from another person bathing (including  washing, rinsing, drying)?: A Lot Help from another person to put on and taking off regular upper body clothing?: A Little Help from another person to put on and taking off regular lower body clothing?: A Lot 6 Click Score: 15   End of Session Nurse Communication: Mobility status  Activity Tolerance: Patient tolerated treatment well;Patient limited by fatigue Patient left: in bed;with call bell/phone within reach;with nursing/sitter in room  OT Visit Diagnosis: Unsteadiness on feet (R26.81);Muscle weakness (generalized) (M62.81)                Time: [id number] OT Time Calculation (min): 23 min Charges:  OT General Charges $OT Visit: 1 Visit OT Evaluation $OT Eval  Moderate Complexity: 1 Mod OT Treatments $Therapeutic Activity: 8-22 mins  Gerrianne Scale, MS, OTR/L ascom [phone] or [phone] 07/26/19, 1:30 PM

## 2019-07-27 DIAGNOSIS — R29810 Facial weakness: Secondary | ICD-10-CM

## 2019-07-27 LAB — NOVEL CORONAVIRUS, NAA (HOSP ORDER, SEND-OUT TO REF LAB; TAT 18-24 HRS): SARS-CoV-2, NAA: NOT DETECTED

## 2019-07-27 MED ORDER — LISINOPRIL 5 MG PO TABS
5.0000 mg | ORAL_TABLET | Freq: Every day | ORAL | Status: DC
  Administered 2019-07-27 – 2019-07-28 (×2): 5 mg via ORAL
  Filled 2019-07-27 (×2): qty 1

## 2019-07-27 MED ORDER — VITAMIN D 25 MCG (1000 UNIT) PO TABS
2000.0000 [IU] | ORAL_TABLET | Freq: Every day | ORAL | Status: DC
  Administered 2019-07-27 – 2019-07-28 (×2): 2000 [IU] via ORAL
  Filled 2019-07-27 (×2): qty 2

## 2019-07-27 MED ORDER — DOCUSATE SODIUM 50 MG/5ML PO LIQD
50.0000 mg | Freq: Two times a day (BID) | ORAL | Status: DC
  Administered 2019-07-28: 11:00:00 50 mg via ORAL
  Filled 2019-07-27 (×4): qty 10

## 2019-07-27 MED ORDER — VITAMIN B-12 1000 MCG PO TABS
1000.0000 ug | ORAL_TABLET | Freq: Every day | ORAL | Status: DC
  Administered 2019-07-27 – 2019-07-28 (×2): 1000 ug via ORAL
  Filled 2019-07-27 (×2): qty 1

## 2019-07-27 MED ORDER — CITALOPRAM HYDROBROMIDE 20 MG PO TABS
20.0000 mg | ORAL_TABLET | Freq: Every day | ORAL | Status: DC
  Administered 2019-07-27 – 2019-07-28 (×2): 20 mg via ORAL
  Filled 2019-07-27 (×2): qty 1

## 2019-07-27 MED ORDER — QUETIAPINE FUMARATE 25 MG PO TABS
25.0000 mg | ORAL_TABLET | Freq: Every day | ORAL | Status: DC
  Administered 2019-07-27: 22:00:00 25 mg via ORAL
  Filled 2019-07-27: qty 1

## 2019-07-27 MED ORDER — MEMANTINE HCL 5 MG PO TABS
10.0000 mg | ORAL_TABLET | Freq: Two times a day (BID) | ORAL | Status: DC
  Administered 2019-07-27 – 2019-07-28 (×2): 10 mg via ORAL
  Filled 2019-07-27 (×2): qty 2

## 2019-07-27 MED ORDER — SULFAMETHOXAZOLE-TRIMETHOPRIM 800-160 MG PO TABS
1.0000 | ORAL_TABLET | Freq: Two times a day (BID) | ORAL | Status: DC
  Administered 2019-07-27 – 2019-07-28 (×2): 1 via ORAL
  Filled 2019-07-27 (×4): qty 1

## 2019-07-27 MED ORDER — ASPIRIN EC 325 MG PO TBEC
325.0000 mg | DELAYED_RELEASE_TABLET | Freq: Every day | ORAL | Status: DC
  Administered 2019-07-28: 325 mg via ORAL
  Filled 2019-07-27 (×2): qty 1

## 2019-07-27 NOTE — Evaluation (Signed)
Clinical/Bedside Swallow Evaluation Patient Details  Name: Gabrielle Crosby MRN: [id number] Date of Birth: 03/24/1933  Today's Date: 07/27/2019 Time: SLP Start Time (ACUTE ONLY): 1010 SLP Stop Time (ACUTE ONLY): 1104 SLP Time Calculation (min) (ACUTE ONLY): 54 min  Past Medical History:  Past Medical History:  Diagnosis Date  . Arthritis    hands  . Bladder cancer ([place])   . Brain bleed ([place]) 2019   d/t light strokes, high bp, seizures. brain has shrunk per dr. Manuella Ghazi and mri  . Cancer ([place])    BLADDER X 2  . Dementia ([place]) 2019   dementia progressing  . Depression   . GERD (gastroesophageal reflux disease)    chokes easily even on own saliva and then has panic attack  . Hyperlipidemia   . Hypertension 2019   no medications.  no htn per daughter  . Seizures [facility])    family not aware of any seizures and no treatment  . Stroke [facility]) 2019   undiagnosed but per neuro, she has had them causing vascular dementia  . Wears dentures    partial upper and lower   Past Surgical History:  Past Surgical History:  Procedure Laterality Date  . ABDOMINAL HYSTERECTOMY Bilateral    BILATERAL s&o  . BACK SURGERY    . BLADDER SURGERY N/A 2015   REMOVAL OF BLADDER CANCER X 2  . BOWEL RESECTION N/A 1999   SMALL BOWEL OBSTRUCTION  . CHOLECYSTECTOMY    . COLONOSCOPY N/A 11/06/2015   Procedure: COLONOSCOPY;  Surgeon: Hulen Luster, MD;  Location: [place];  Service: Gastroenterology;  Laterality: N/A;  . CYSTOSCOPY WITH BIOPSY N/A 04/17/2015   Procedure: CYSTOSCOPY WITH BIOPSY;  Surgeon: Hollice Espy, MD;  Location: [facility];  Service: Urology;  Laterality: N/A;  . KYPHOPLASTY N/A 12/20/2017   Procedure: [id number];  Surgeon: Hessie Knows, MD;  Location: [facility];  Service: Orthopedics;  Laterality: N/A;  . KYPHOPLASTY N/A 04/21/2018   Procedure: KYPHOPLASTY-L2,L3,L4,L5;  Surgeon: Hessie Knows, MD;  Location: [facility];  Service: Orthopedics;  Laterality: N/A;  . SACROPLASTY N/A  11/03/2018   Procedure: Dillard Cannon;  Surgeon: Hessie Knows, MD;  Location: [facility];  Service: Orthopedics;  Laterality: N/A;   HPI:      Assessment / Plan / Recommendation Clinical Impression  Pt presents with mild to moderate dysphagia. Pt tolerated all boluses without s/s of aspiration. Oral structures and their functioning assessed through oral phase of swallow as Pt had some difficulty with directions. Noted anterior leakage with sips of thin liquid by cup and by straw. Pt tolerated the nectar thick liquids well with no leakage. Oral transit delay with solids as well as moderate oral residue that Pt was unaware of. Alternating liquids with solids eventually helped clear the residue. Vocal quality remained clear throughout assessment. Rec Dysphagia 1 with nectar thick liquids for now. ST to follow up with toleration of diet and advance as appropriate. SLP Visit Diagnosis: Dysphagia, oropharyngeal phase (R13.12)    Aspiration Risk  Moderate aspiration risk    Diet Recommendation Dysphagia 1 (Puree)   Medication Administration: Whole meds with puree Supervision: Full supervision/cueing for compensatory strategies Compensations: Slow rate;Small sips/bites;Minimize environmental distractions Postural Changes: Seated upright at 90 degrees;Remain upright for at least 30 minutes after po intake    Other  Recommendations Oral Care Recommendations: Oral care BID   Follow up Recommendations   Trials of thin liquid and chopped foods in hopes of diet upgrade     Frequency and Duration  min 3x week  1 week       Prognosis Prognosis for Safe Diet Advancement: Good Barriers to Reach Goals: Cognitive deficits      Swallow Study   General Type of Study: Bedside Swallow Evaluation Diet Prior to this Study: NPO Respiratory Status: Room air Behavior/Cognition: Alert;Pleasant mood;Cooperative;Confused Oral Cavity Assessment: Dry Oral Care Completed by SLP: Yes Oral Cavity - Dentition:  Poor condition;Missing dentition Vision: Impaired for self-feeding Self-Feeding Abilities: Needs assist Patient Positioning: Upright in bed Baseline Vocal Quality: Normal    Oral/Motor/Sensory Function Overall Oral Motor/Sensory Function: Mild impairment Lingual ROM: Reduced right;Reduced left Lingual Strength: Reduced   Ice Chips Ice chips: Within functional limits Presentation: Spoon   Thin Liquid Thin Liquid: Impaired Presentation: Cup Oral Phase Impairments: Reduced labial seal Oral Phase Functional Implications: Left anterior spillage Pharyngeal  Phase Impairments: Multiple swallows    Nectar Thick Nectar Thick Liquid: Within functional limits Presentation: Cup;Self Fed   Honey Thick     Puree Puree: Within functional limits Presentation: Spoon   Solid     Solid: Impaired Presentation: Self Fed Oral Phase Impairments: Reduced labial seal;Reduced lingual movement/coordination;Poor awareness of bolus;Impaired mastication Oral Phase Functional Implications: Prolonged oral transit;Impaired mastication;Oral residue      Lucila Maine 07/27/2019,11:04 AM

## 2019-07-27 NOTE — Progress Notes (Signed)
Neurology Follow-up Progress Note [name] at [facility]  Date of Admission: 07/25/2019   ASSESSMENT:  Gabrielle Crosby is a 85 y.o. year old female with past medical history significant for dementia, HLD, HTN, prior seizure-like history who presents with right gaze deviation and left sided deficits and was found to have a right frontoparietal infarct. CTA head/neck demonstrated no LVO or significant carotid stenosis. TTE with normal EF. At baseline, she is on ASA 81 mg daily at home.    PLAN: - Increase ASA to 325 mg daily. Continue with statin for secondary stroke prevention - No further neurological work-up at this time - Continue with PT/OT - Please call neurology with any further questions or concenrs    SUBJECTIVE:  No acute events overnight. No change in exam this morning. Patient denies any new weakness or numbness.    OBJECTIVE: Vital Signs Temp:  [97.7 F (36.5 C)-98.8 F (37.1 C)] 98.3 F (36.8 C) (08/27 0445) Pulse Rate:  [77-86] 82 (08/27 0445) Resp:  [16-20] 17 (08/27 0445) BP: (135-171)/(59-92) 160/71 (08/27 0445) SpO2:  [78 %-100 %] 98 % (08/27 0445)   Physical Exam:  GENERAL:  No acute distress.   MENTAL STATUS EXAM:    Orientation: Drowsy but wakes up to vocal stimulation. Able to tell me her name. Does not know where she is or what year it is. Cooperative, follows one-step commands. Language: Speech is dyarthric and language comprehension is limited but appears to be intact.   CRANIAL NERVES:    CN 2 (Optic): Visual fields intact to confrontation CN 3,4,6 (EOM): Pupils equal and reactive to light. Full extraocular eye movement with oculocephalic maneuver.   CN 5 (Trigeminal): Facial sensation is normal CN 7 (Facial): LEFT lower facial weakness  CN 8 (Auditory): Auditory acuity grossly normal.  CN 9,10 (Glossophar): The uvula is midline, the palate elevates symmetrically.  CN 11 (spinal access): Normal sternocleidomastoid and trapezius strength.   CN 12 (Hypoglossal): The tongue is midline. No atrophy or fasciculations.Marland Kitchen   MOTOR:  Muscle Strength: Strength - Moving all four extremities at least anti-gravity. Drifts consistently with left hemibody compared to the right hemibody   SENSATION: Intact to light touch throughout per report  Labs: I've reviewed the labs for today   Diagnostic Results:  MRI Brain wo: Acute infarct right posterior frontal cortex and adjacent white matter. Small area of acute infarct in the operculum on the right  CTA head/neck: no significant carotid stenosis and no LVO  TTE: normal EF

## 2019-07-27 NOTE — Progress Notes (Signed)
Please note, patient is currently followed by [facility] outpatient Palliative at home. Plan was for patient tot transition to [facility] family care home. CSW [place] Sample aware. Flo Shanks BSN, RN, [facility] [employer] [phone]

## 2019-07-27 NOTE — Progress Notes (Deleted)
Pt confused and attempting to get OOB. Pt not easily re-oriented and does not seem to understand safety measures in place. Safety sitter in place and maintaining safety at this time.

## 2019-07-27 NOTE — Progress Notes (Addendum)
Pt confused with multiple attempts to get OOB without assistance. Pt easily redirected for short periods of time. Safety measures in place and no injury has occurred at this time

## 2019-07-27 NOTE — Progress Notes (Signed)
PT Cancellation Note  Patient Details Name: Gabrielle Crosby MRN: [id number] DOB: 02-19-1933   Cancelled Treatment:    Reason Eval/Treat Not Completed: Other (comment)(PT with other rehab team members at this time. PT will follow up as able.)   Lieutenant Diego PT, DPT 10:33 AM,07/27/19 [phone]

## 2019-07-27 NOTE — Progress Notes (Signed)
[facility] [place] NAME: Gabrielle Crosby    MR#:  [id number]  DATE OF BIRTH:  Feb 10, 1933  SUBJECTIVE:  CHIEF COMPLAINT: Patient is arousable but not answering any questions Sitter to watch the patient for safety  REVIEW OF SYSTEMS:  Unobtainable in view of dementia  DRUG ALLERGIES:   Allergies  Allergen Reactions  . Atenolol Other (See Comments)    Bradycardia   . Donepezil Hcl Other (See Comments)    Mental changes, "made me crazy"    VITALS:  Blood pressure (!) 160/71, pulse 82, temperature 98.3 F (36.8 C), resp. rate 17, height 5\' 1"  (1.549 m), weight 50 kg, SpO2 98 %.  PHYSICAL EXAMINATION:  GENERAL:  85 y.o.-year-old patient lying in the bed with no acute distress.  EYES: Pupils equal, round, reactive to light and accommodation. No scleral icterus. Extraocular muscles intact.  HEENT: Head atraumatic, normocephalic. Oropharynx and nasopharynx clear.  NECK:  Supple, no jugular venous distention. No thyroid enlargement, no tenderness.  LUNGS: Normal breath sounds bilaterally, no wheezing, rales,rhonchi or crepitation. No use of accessory muscles of respiration.  CARDIOVASCULAR: S1, S2 normal. No murmurs, rubs, or gallops.  ABDOMEN: Soft, nontender, nondistended. Bowel sounds present.  EXTREMITIES: No pedal edema, cyanosis, or clubbing.  NEUROLOGIC:  disoriented  pSYCHIATRIC: The patient is arousable and disoriented SKIN: No obvious rash, lesion, or ulcer.    LABORATORY PANEL:   CBC Recent Labs  Lab 07/25/19 1922  WBC 6.3  HGB 13.6  HCT 42.1  PLT 260   ------------------------------------------------------------------------------------------------------------------  Chemistries  Recent Labs  Lab 07/25/19 1922  NA 139  K 3.9  CL 105  CO2 25  GLUCOSE 105*  BUN 14  CREATININE 0.84  CALCIUM 9.2  AST 19  ALT 12  ALKPHOS 71  BILITOT 0.6    ------------------------------------------------------------------------------------------------------------------  Cardiac Enzymes No results for input(s): TROPONINI in the last 168 hours. ------------------------------------------------------------------------------------------------------------------  RADIOLOGY:  Ct Angio Head W Or Wo Contrast  Result Date: 07/25/2019 CLINICAL DATA:  Vascular dementia and Alzheimer's dementia. Left facial droop. Difficulty swallowing. Symptoms began 1800 hours EXAM: CT ANGIOGRAPHY HEAD AND NECK CT PERFUSION BRAIN TECHNIQUE: Multidetector CT imaging of the head and neck was performed using the standard protocol during bolus administration of intravenous contrast. Multiplanar CT image reconstructions and MIPs were obtained to evaluate the vascular anatomy. Carotid stenosis measurements (when applicable) are obtained utilizing NASCET criteria, using the distal internal carotid diameter as the denominator. Multiphase CT imaging of the brain was performed following IV bolus contrast injection. Subsequent parametric perfusion maps were calculated using RAPID software. CONTRAST:  186mL OMNIPAQUE IOHEXOL 350 MG/ML SOLN COMPARISON:  Head CT earlier same day.  MRI 01/14/2018 FINDINGS: CTA NECK FINDINGS Aortic arch: Aortic atherosclerosis. No aneurysm or dissection. Branching pattern is normal. Right carotid system: Common carotid artery widely patent to the bifurcation region. Calcified plaque at the carotid bifurcation and ICA bulb but no stenosis. Cervical ICA widely patent to the upper cervical region. There is irregularity of the vessel at the C1 level consistent with fibromuscular change. Left carotid system: Common carotid artery widely patent to the bifurcation. Calcified plaque at the carotid bifurcation. No stenosis. Cervical ICA is widely patent, with some irregularity in the upper cervical region related to fibromuscular change. Vertebral arteries: No vertebral  origin stenosis. Both vertebral arteries appear widely patent through the cervical region to the C1 level, where there is mild irregularity consistent with fibromuscular disease. Skeleton: Ordinary cervical spondylosis. Other neck:  No mass or adenopathy. Upper chest: Mild emphysema and pulmonary scarring. Review of the MIP images confirms the above findings CTA HEAD FINDINGS Anterior circulation: Both internal carotid arteries are patent through the skull base and siphon regions. The anterior and middle cerebral vessels do not show proximal stenosis or occlusion. There is no M1 or M2 stenosis. I can not identify a missing M3 branch on either side. Posterior circulation: Both vertebral arteries are widely patent to the basilar. No basilar stenosis. Posterior circulation branch vessels are patent. Venous sinuses: Patent and normal. Anatomic variants: None significant. Review of the MIP images confirms the above findings CT Brain Perfusion Findings: ASPECTS: 10 CBF (<30%) Volume: 68mL Perfusion (Tmax>6.0s) volume: 64mL Mismatch Volume: 94mL Infarction Location:No infarction identified. Right frontoparietal junction region changes consistent with ischemia in a right M3 branch distribution. IMPRESSION: Ordinary atherosclerosis of the arch and neck vessels without stenosis. Irregularity of the internal carotid arteries and vertebral arteries in the upper neck consistent with fibromuscular disease but without evidence of flow limiting stenosis. No infarction demonstrated by perfusion study. Ischemic change in the right frontoparietal junction region consistent with right M3 branch vessel insult. I can not specifically identify that by CT angiography. Volume measures 8 cc. These results were called by telephone at the time of interpretation on 07/25/2019 at 8:31 pm to Dr. Jimmye Norman, who verbally acknowledged these results. Electronically Signed   By: Nelson Chimes M.D.   On: 07/25/2019 20:34   Ct Angio Neck W Or Wo  Contrast  Result Date: 07/25/2019 CLINICAL DATA:  Vascular dementia and Alzheimer's dementia. Left facial droop. Difficulty swallowing. Symptoms began 1800 hours EXAM: CT ANGIOGRAPHY HEAD AND NECK CT PERFUSION BRAIN TECHNIQUE: Multidetector CT imaging of the head and neck was performed using the standard protocol during bolus administration of intravenous contrast. Multiplanar CT image reconstructions and MIPs were obtained to evaluate the vascular anatomy. Carotid stenosis measurements (when applicable) are obtained utilizing NASCET criteria, using the distal internal carotid diameter as the denominator. Multiphase CT imaging of the brain was performed following IV bolus contrast injection. Subsequent parametric perfusion maps were calculated using RAPID software. CONTRAST:  143mL OMNIPAQUE IOHEXOL 350 MG/ML SOLN COMPARISON:  Head CT earlier same day.  MRI 01/14/2018 FINDINGS: CTA NECK FINDINGS Aortic arch: Aortic atherosclerosis. No aneurysm or dissection. Branching pattern is normal. Right carotid system: Common carotid artery widely patent to the bifurcation region. Calcified plaque at the carotid bifurcation and ICA bulb but no stenosis. Cervical ICA widely patent to the upper cervical region. There is irregularity of the vessel at the C1 level consistent with fibromuscular change. Left carotid system: Common carotid artery widely patent to the bifurcation. Calcified plaque at the carotid bifurcation. No stenosis. Cervical ICA is widely patent, with some irregularity in the upper cervical region related to fibromuscular change. Vertebral arteries: No vertebral origin stenosis. Both vertebral arteries appear widely patent through the cervical region to the C1 level, where there is mild irregularity consistent with fibromuscular disease. Skeleton: Ordinary cervical spondylosis. Other neck: No mass or adenopathy. Upper chest: Mild emphysema and pulmonary scarring. Review of the MIP images confirms the above  findings CTA HEAD FINDINGS Anterior circulation: Both internal carotid arteries are patent through the skull base and siphon regions. The anterior and middle cerebral vessels do not show proximal stenosis or occlusion. There is no M1 or M2 stenosis. I can not identify a missing M3 branch on either side. Posterior circulation: Both vertebral arteries are widely patent to the basilar. No basilar stenosis.  Posterior circulation branch vessels are patent. Venous sinuses: Patent and normal. Anatomic variants: None significant. Review of the MIP images confirms the above findings CT Brain Perfusion Findings: ASPECTS: 10 CBF (<30%) Volume: 35mL Perfusion (Tmax>6.0s) volume: 75mL Mismatch Volume: 38mL Infarction Location:No infarction identified. Right frontoparietal junction region changes consistent with ischemia in a right M3 branch distribution. IMPRESSION: Ordinary atherosclerosis of the arch and neck vessels without stenosis. Irregularity of the internal carotid arteries and vertebral arteries in the upper neck consistent with fibromuscular disease but without evidence of flow limiting stenosis. No infarction demonstrated by perfusion study. Ischemic change in the right frontoparietal junction region consistent with right M3 branch vessel insult. I can not specifically identify that by CT angiography. Volume measures 8 cc. These results were called by telephone at the time of interpretation on 07/25/2019 at 8:31 pm to Dr. Jimmye Norman, who verbally acknowledged these results. Electronically Signed   By: Nelson Chimes M.D.   On: 07/25/2019 20:34   Mr Brain Wo Contrast  Result Date: 07/26/2019 CLINICAL DATA:  Vascular dementia and Alzheimer's dementia. Stroke. Left facial droop. EXAM: MRI HEAD WITHOUT CONTRAST TECHNIQUE: Multiplanar, multiecho pulse sequences of the brain and surrounding structures were obtained without intravenous contrast. COMPARISON:  CTA and CT perfusion 07/25/2019.  MRI brain 01/14/2018 FINDINGS: Brain:  Acute infarct in the right posterior frontal cortex as well as a small acute infarct in the operculum on the right. This corresponds to the area of delayed perfusion ischemia on CT perfusion yesterday. Small acute infarct right frontal white matter. Moderate atrophy with ventricular enlargement, stable. Chronic infarct left occipital lobe. Chronic ischemic changes in the white matter have progressed since 01/14/2018. Progression of multiple areas of chronic microhemorrhage in the brain in the right frontal lobe and right convexity. There is chronic hemorrhage over the right convexity, question history of head injury and subarachnoid hemorrhage. Vascular: Normal arterial flow voids. Skull and upper cervical spine: Negative Sinuses/Orbits: Bilateral cataract surgery. Paranasal sinuses clear. Other: None IMPRESSION: 1. Acute infarct right posterior frontal cortex and adjacent white matter. Small area of acute infarct in the operculum on the right. 2. Moderate atrophy. Progressive chronic ischemic changes since 01/14/2018. Progression of multiple areas of chronic microhemorrhage in the right cerebral hemisphere as well as chronic subarachnoid hemorrhage over the convexity in the right. Question interval head injury. Electronically Signed   By: Franchot Gallo M.D.   On: 07/26/2019 10:38   US Carotid Bilateral (at [facility] Only)  Result Date: 07/26/2019 CLINICAL DATA:  Facial droop.  Hypertension, hyperlipidemia. EXAM: BILATERAL CAROTID DUPLEX ULTRASOUND TECHNIQUE: Pearline Cables scale imaging, color Doppler and duplex ultrasound were performed of bilateral carotid and vertebral arteries in the neck. COMPARISON:  CT 07/25/2019 FINDINGS: Criteria: Quantification of carotid stenosis is based on velocity parameters that correlate the residual internal carotid diameter with NASCET-based stenosis levels, using the diameter of the distal internal carotid lumen as the denominator for stenosis measurement. The following velocity  measurements were obtained: RIGHT ICA: 85/13 cm/sec CCA: [id number] cm/sec SYSTOLIC ICA/CCA RATIO:  1.3 ECA: 71 cm/sec LEFT ICA: 97/17 cm/sec CCA: [id number] cm/sec SYSTOLIC ICA/CCA RATIO:  1.6 ECA: 77 cm/sec RIGHT CAROTID ARTERY: Mild tortuosity. Intimal thickening through the common carotid artery. Partially calcified plaque at the carotid bifurcation and ICA origin without high-grade stenosis. Normal waveforms and color Doppler signal. RIGHT VERTEBRAL ARTERY:  Normal flow direction and waveform. LEFT CAROTID ARTERY: Intimal thickening through the common carotid artery. Mild plaque in the proximal internal and external carotid arteries without  high-grade stenosis. Normal waveforms and color Doppler signal. LEFT VERTEBRAL ARTERY:  Normal flow direction and waveform. IMPRESSION: 1. Bilateral carotid bifurcation plaque resulting in less than 50% diameter ICA stenosis. 2. Antegrade bilateral vertebral arterial flow. Electronically Signed   By: Lucrezia Europe M.D.   On: 07/26/2019 10:37   Ct Cerebral Perfusion W Contrast  Result Date: 07/25/2019 CLINICAL DATA:  Vascular dementia and Alzheimer's dementia. Left facial droop. Difficulty swallowing. Symptoms began 1800 hours EXAM: CT ANGIOGRAPHY HEAD AND NECK CT PERFUSION BRAIN TECHNIQUE: Multidetector CT imaging of the head and neck was performed using the standard protocol during bolus administration of intravenous contrast. Multiplanar CT image reconstructions and MIPs were obtained to evaluate the vascular anatomy. Carotid stenosis measurements (when applicable) are obtained utilizing NASCET criteria, using the distal internal carotid diameter as the denominator. Multiphase CT imaging of the brain was performed following IV bolus contrast injection. Subsequent parametric perfusion maps were calculated using RAPID software. CONTRAST:  132mL OMNIPAQUE IOHEXOL 350 MG/ML SOLN COMPARISON:  Head CT earlier same day.  MRI 01/14/2018 FINDINGS: CTA NECK FINDINGS Aortic arch: Aortic  atherosclerosis. No aneurysm or dissection. Branching pattern is normal. Right carotid system: Common carotid artery widely patent to the bifurcation region. Calcified plaque at the carotid bifurcation and ICA bulb but no stenosis. Cervical ICA widely patent to the upper cervical region. There is irregularity of the vessel at the C1 level consistent with fibromuscular change. Left carotid system: Common carotid artery widely patent to the bifurcation. Calcified plaque at the carotid bifurcation. No stenosis. Cervical ICA is widely patent, with some irregularity in the upper cervical region related to fibromuscular change. Vertebral arteries: No vertebral origin stenosis. Both vertebral arteries appear widely patent through the cervical region to the C1 level, where there is mild irregularity consistent with fibromuscular disease. Skeleton: Ordinary cervical spondylosis. Other neck: No mass or adenopathy. Upper chest: Mild emphysema and pulmonary scarring. Review of the MIP images confirms the above findings CTA HEAD FINDINGS Anterior circulation: Both internal carotid arteries are patent through the skull base and siphon regions. The anterior and middle cerebral vessels do not show proximal stenosis or occlusion. There is no M1 or M2 stenosis. I can not identify a missing M3 branch on either side. Posterior circulation: Both vertebral arteries are widely patent to the basilar. No basilar stenosis. Posterior circulation branch vessels are patent. Venous sinuses: Patent and normal. Anatomic variants: None significant. Review of the MIP images confirms the above findings CT Brain Perfusion Findings: ASPECTS: 10 CBF (<30%) Volume: 52mL Perfusion (Tmax>6.0s) volume: 44mL Mismatch Volume: 80mL Infarction Location:No infarction identified. Right frontoparietal junction region changes consistent with ischemia in a right M3 branch distribution. IMPRESSION: Ordinary atherosclerosis of the arch and neck vessels without stenosis.  Irregularity of the internal carotid arteries and vertebral arteries in the upper neck consistent with fibromuscular disease but without evidence of flow limiting stenosis. No infarction demonstrated by perfusion study. Ischemic change in the right frontoparietal junction region consistent with right M3 branch vessel insult. I can not specifically identify that by CT angiography. Volume measures 8 cc. These results were called by telephone at the time of interpretation on 07/25/2019 at 8:31 pm to Dr. Jimmye Norman, who verbally acknowledged these results. Electronically Signed   By: Nelson Chimes M.D.   On: 07/25/2019 20:34   Ct Head Code Stroke Wo Contrast  Result Date: 07/25/2019 CLINICAL DATA:  Code stroke. Vascular dementia and Alzheimer's dementia. Left facial droop and speech disturbance beginning after 1700 hours EXAM: CT  HEAD WITHOUT CONTRAST TECHNIQUE: Contiguous axial images were obtained from the base of the skull through the vertex without intravenous contrast. COMPARISON:  01/14/2018.  12/13/2014. FINDINGS: Brain: Brainstem and cerebellum are unremarkable. Cerebral hemispheres show advanced generalized atrophy with temporal predominance. Chronic small-vessel ischemic changes throughout the cerebral hemispheric deep and subcortical white matter. Old left parieto-occipital cortical infarction. Small old left frontal infarction. No sign of acute infarction, mass lesion, hemorrhage, hydrocephalus or extra-axial collection. Vascular: There is atherosclerotic calcification of the major vessels at the base of the brain. Skull: Negative Sinuses/Orbits: Clear/normal Other: None ASPECTS ([place] Stroke Program Early CT Score) - Ganglionic level infarction (caudate, lentiform nuclei, internal capsule, insula, M1-M3 cortex): 7 - Supraganglionic infarction (M4-M6 cortex): 3 Total score (0-10 with 10 being normal): 10 IMPRESSION: 1. No acute finding by CT. Brain atrophy with temporal lobe predominance. Extensive chronic  small-vessel ischemic changes throughout the cerebral hemispheres. Old left frontal and parietooccipital infarctions. 2. ASPECTS is 10. 3. These results were called by telephone at the time of interpretation on 07/25/2019 at 7:16 pm to Dr. Lenise Arena , who verbally acknowledged these results. Electronically Signed   By: Nelson Chimes M.D.   On: 07/25/2019 19:19    EKG:   Orders placed or performed during the hospital encounter of 07/25/19  . ED EKG  . ED EKG  . EKG 12-Lead  . EKG 12-Lead    ASSESSMENT AND PLAN:     # acute right frontal infarct CT head negative MRI positive acute right frontal infarct Aspirin 81 mg enteric-coated daily at home changed to aspirin 325 mg TEE -ejection fraction of left ventricular 60 to 65% Seen by neurology appreciate Dr. Hart [place] recommendations PT is recommending SNF Monitor patient on telemetry -Dysphagia 1 diet  #Essential hypertension Lisinopril is added to the regimen for better control of the blood pressure  #Hyperlipidemia-Lipitor 40 mg   #Alzheimer's and vascular dementia Resume home medications after patient passes bedside swallow evaluation  #GERD-PPI  #Recent UTI with Proteus mirabilis Patient is on p.o. antibiotic Bactrim will continue the same until the course is completed for a total of 5 days  Disposition SNF.  Daughter is agreeable  currently patient is under observation by the sitter for safety  All the records are reviewed and case discussed with Care Management/Social Workerr. Management plans discussed with the patient started by neurology  CODE STATUS:  TOTAL TIME TAKING CARE OF THIS PATIENT: 35  minutes.   POSSIBLE D/C IN 1-2  DAYS, DEPENDING ON CLINICAL CONDITION.  Note: This dictation was prepared with Dragon dictation along with smaller phrase technology. Any transcriptional errors that result from this process are unintentional.   Nicholes Mango M.D on 07/27/2019 at 3:54 PM  Between 7am to 6pm - Pager -  [phone] After 6pm go to [email or web address] - password [email or web address] [facility]  Office  [phone]  CC: Primary care physician; Toni Arthurs, NP

## 2019-07-27 NOTE — TOC Progression Note (Signed)
Transition of Care [facility]) - Progression Note    Patient Details  Name: Gabrielle Crosby MRN: [id number] Date of Birth: Jul 22, 1933  [facility] CM/SW Contact  Rabon Scholle, Lenice Llamas Phone Number: [phone]  07/27/2019, 5:48 PM  Clinical Narrative: Per Claiborne Billings admissions coordinator at [facility] authorization has been received. Patient will need to be 24 hours without a sitter before going to [facility]. Patient will need another negative covid test, her last negative test was 8/25. RN and MD aware of above.      Expected Discharge Plan: [place] Barriers to Discharge: Continued Medical Work up  Expected Discharge Plan and Services Expected Discharge Plan: [place] In-house Referral: Clinical Social Work Discharge Planning Services: CM Consult Post Acute Care Choice: [place] Living arrangements for the past 2 months: Single Family Home                 DME Arranged: N/A         HH Arranged: NA           Social Determinants of Health (SDOH) Interventions    Readmission Risk Interventions No flowsheet data found.

## 2019-07-27 NOTE — TOC Initial Note (Signed)
Transition of Care [facility]) - Initial/Assessment Note    Patient Details  Name: Gabrielle Crosby MRN: [id number] Date of Birth: June 21, 1933  Transition of Care [facility]) CM/SW Contact:    Versie Fleener, [place], [place] Phone Number: [phone]  07/27/2019, 9:22 AM  Clinical Narrative: PT is recommending SNF. Per chart patient is confused. Clinical Social Worker (CSW) contacted patient's daughter [name] to complete assessment. Per daughter patient lives with her and her husband Delfino Lovett in [place]. Per daughter patient has lived with her for 3 years and moved in shortly after patient's husband passed away. Per daughter she is HPOA and patient has 1 other son Dominica Severin. Per daughter she is patient's primary caregiver and also takes care of her husband. Per daughter her husband has bladder cancer and a bad heart. CSW provided emotional support. Per daughter patient walks without an assistive device at baseline. Daughter reported that she made arrangements for patient to move into [facility] [place] prior to hospitalization. Per daughter she signed the paper work and paid the deposit to hold the room at the family care home. CSW explained that PT is recommenidng SNF and explained SNF process. CSW explained that [facility] will have to approve SNF. CSW explained that after patient completes rehab she may be able to transition to the [facility]. Daughter verbalized her understanding and is agreeable to SNF search in [place]. FL2 complete and faxed out.   CSW presented SNF bed offers to daughter via telephone. Daughter chose [facility]. Daughter understands that patient can't have in person visitors at the facility due to covid-19. Per [facility] admissions coordinator at [facility] she will start [facility] SNF authorization today. CSW will continue to follow and assist as needed.                 Expected Discharge Plan: Skilled Nursing Facility Barriers to Discharge: Continued Medical Work  up   Patient Goals and CMS Choice   CMS Medicare.gov Compare Post Acute Care list provided to:: Patient Represenative (must comment)(Patient's daughter [name]) Choice offered to / list presented to : Adult Children  Expected Discharge Plan and Services Expected Discharge Plan: [place] In-house Referral: Clinical Social Work Discharge Planning Services: CM Consult Post Acute Care Choice: [place] arrangements for the past 2 months: Single Family Home                 DME Arranged: N/A         HH Arranged: NA          Prior Living Arrangements/Services Living arrangements for the past 2 months: Single Family Home Lives with:: Adult Children Patient language and need for interpreter reviewed:: No Do you feel safe going back to the place where you live?: Yes      Need for Family Participation in Patient Care: Yes (Comment) Care giver support system in place?: Yes (comment)   Criminal Activity/Legal Involvement Pertinent to Current Situation/Hospitalization: No - Comment as needed  Activities of Daily Living      Permission Sought/Granted Permission sought to share information with : Chartered certified accountant granted to share information with : Yes, Verbal Permission Granted              Emotional Assessment Appearance:: Appears stated age     Orientation: : Oriented to Self, Fluctuating Orientation (Suspected and/or reported Sundowners) Alcohol / Substance Use: Not Applicable Psych Involvement: No (comment)  Admission diagnosis:  Facial droop [R29.810]  Cerebrovascular accident (CVA), unspecified mechanism ([place]) [I63.9] Patient Active Problem List   Diagnosis Date Noted  . HLD (hyperlipidemia) 07/25/2019  . GERD (gastroesophageal reflux disease) 07/25/2019  . Facial droop 07/25/2019  . Loss of memory 07/26/2016  . Anxiety 06/26/2016  . Bradycardia 01/02/2015  . H/O deep venous thrombosis 01/02/2015  .  MI (mitral incompetence) 01/02/2015  . Near syncope 01/02/2015  . Malignant neoplasm of urinary bladder ([place]) 08/22/2014  . Mixed Alzheimer's and vascular dementia ([place]) 08/22/2014  . Essential (primary) hypertension 08/22/2014  . Adaptive colitis 08/22/2014  . Lacunar infarction ([place]) 08/22/2014  . Combined fat and carbohydrate induced hyperlipemia 08/22/2014  . Allergic rhinitis, seasonal 08/22/2014   PCP:  Toni Arthurs, NP Pharmacy:   [id number] - [place], [address] [place] [address] Phone: [phone] Fax: [phone]     Social Determinants of Health (SDOH) Interventions    Readmission Risk Interventions No flowsheet data found.

## 2019-07-27 NOTE — Progress Notes (Signed)
Physical Therapy Treatment Patient Details Name: Gabrielle Crosby MRN: [id number] DOB: 19-Jan-1933 Today's Date: 07/27/2019    History of Present Illness Pt is 85 yo female with PMH of bladder cancer, brain bleeds, dementia, depression, HLD, HTN, seizures, stroke, presented to ED for facial droop. MRI showed acute infarct right posterior frontal cortex and adjacent white matter. Small area of acute infarct in the operculum on the right.    PT Comments    Patient in bed with speech at bedside, much more alert and conversant than previous session. Pt able to keep her eyes open with verbal cues throughout most of time with PT, glasses donned. Pt initially performed bed mobility minA to assist with pt care, and then transferred to EOB with extensive use of UE and supervision. Pt sat EOB with fair balance. Sit <> stand with CGA-handheld assist due to initial unsteadiness and to complete weight shift. Pt ambulated ~42ft with handheld assist and CGA, attempted to utilize RW pt unable to understand proper use. Pt exhibited very slow, shuffled steps, or quick unsteady stepping, 1 instance of minA for PT to correct. Overall the patient demonstrated improvement in mobility, still presented with deficits in balance, safety awareness, and L inattention (though improved, able to LUE against gravity and attend to L with mod cueing). Current plan remains appropriate to maximize safety, mobility, and functional abilities.     Follow Up Recommendations  SNF     Equipment Recommendations  Rolling walker with 5" wheels    Recommendations for Other Services       Precautions / Restrictions Precautions Precautions: Fall Restrictions Weight Bearing Restrictions: No    Mobility  Bed Mobility Overal bed mobility: Needs Assistance Bed Mobility: Supine to Sit;Rolling Rolling: Min assist   Supine to sit: Min guard        Transfers Overall transfer level: Needs assistance Equipment used: None;1 person  hand held assist Transfers: Sit to/from Stand Sit to Stand: Min guard         General transfer comment: pt prefers handheld assist to transfer, able to sit in recliner with UE support on chair arms, CGA  Ambulation/Gait Ambulation/Gait assistance: Min guard Gait Distance (Feet): 80 Feet Assistive device: None;1 person hand held assist       General Gait Details: Pt able to ambulate without handheld assist, though unsteadiness increases as wel as anterior lean, handheld assist provided for safety. Constant redirecting needed as well as verbal/tactile cues   Stairs             Wheelchair Mobility    Modified Rankin (Stroke Patients Only)       Balance Overall balance assessment: Needs assistance Sitting-balance support: No upper extremity supported;Feet unsupported Sitting balance-Leahy Scale: Fair Sitting balance - Comments: able to sit without UE or LE support     Standing balance-Leahy Scale: Fair Standing balance comment: Pt able to stand without UE support, but prefers at least unilateral UE support                            Cognition Arousal/Alertness: Awake/alert Behavior During Therapy: WFL for tasks assessed/performed Overall Cognitive Status: History of cognitive impairments - at baseline                                 General Comments: Patient with improved mentation this session, able to verbalize and participate more fully  Exercises      General Comments        Pertinent Vitals/Pain Pain Assessment: No/denies pain    Home Living                      Prior Function            PT Goals (current goals can now be found in the care plan section) Progress towards PT goals: Progressing toward goals    Frequency    7X/week      PT Plan Current plan remains appropriate    Co-evaluation              AM-PAC PT "6 Clicks" Mobility   Outcome Measure  Help needed turning from your back  to your side while in a flat bed without using bedrails?: A Little Help needed moving from lying on your back to sitting on the side of a flat bed without using bedrails?: A Little Help needed moving to and from a bed to a chair (including a wheelchair)?: A Little Help needed standing up from a chair using your arms (e.g., wheelchair or bedside chair)?: A Little Help needed to walk in hospital room?: A Little Help needed climbing 3-5 steps with a railing? : A Little 6 Click Score: 18    End of Session Equipment Utilized During Treatment: Gait belt Activity Tolerance: Patient tolerated treatment well Patient left: in chair;with chair alarm set;with nursing/sitter in room;with call bell/phone within reach Nurse Communication: Mobility status PT Visit Diagnosis: Unsteadiness on feet (R26.81);Other abnormalities of gait and mobility (R26.89);Muscle weakness (generalized) (M62.81);Other symptoms and signs involving the nervous system (R29.898)     Time: [id number] PT Time Calculation (min) (ACUTE ONLY): 26 min  Charges:  $Therapeutic Exercise: 23-37 mins                     Lieutenant Diego PT, DPT 12:12 PM,07/27/19 [phone]

## 2019-07-28 LAB — SARS CORONAVIRUS 2 BY RT PCR (HOSPITAL ORDER, PERFORMED IN ~~LOC~~ HOSPITAL LAB): SARS Coronavirus 2: NEGATIVE

## 2019-07-28 MED ORDER — ATORVASTATIN CALCIUM 40 MG PO TABS: 40.0000 mg | ORAL_TABLET | Freq: Every day | ORAL | Status: DC

## 2019-07-28 MED ORDER — SULFAMETHOXAZOLE-TRIMETHOPRIM 800-160 MG PO TABS: 1.0000 | ORAL_TABLET | Freq: Two times a day (BID) | ORAL | 0 refills | Status: DC

## 2019-07-28 MED ORDER — LISINOPRIL 5 MG PO TABS: 5.0000 mg | ORAL_TABLET | Freq: Every day | ORAL | Status: AC

## 2019-07-28 MED ORDER — ASPIRIN 325 MG PO TBEC: 325.0000 mg | DELAYED_RELEASE_TABLET | Freq: Every day | ORAL | 0 refills | Status: DC

## 2019-07-28 NOTE — TOC Transition Note (Signed)
Transition of Care [facility]) - CM/SW Discharge Note   Patient Details  Name: Gabrielle Crosby MRN: [id number] Date of Birth: 11-29-1933  Transition of Care [facility]) CM/SW Contact:  Danijela Vessey, Lenice Llamas Phone Number: [phone]  07/28/2019, 3:20 PM   Clinical Narrative: Patient is medically stable for D/C to [facility] today. Per [facility] admissions coordinator at [facility] patient can come today to room 31-B. Claiborne Billings is aware that patient's last covid test was negative on 8/27. Patient will be sitter free for 24 hours at 3 pm today. RN will call report and arrange EMS for transport. Clinical Education officer, museum (CSW) sent D/C orders to [facility] via [place]. CSW contacted patient's daughter [name] and made her aware of above. Please reconsult if future social work needs arise. CSW signing off.      Final next level of care: Skilled Nursing Facility Barriers to Discharge: Barriers Resolved   Patient Goals and CMS Choice   CMS Medicare.gov Compare Post Acute Care list provided to:: Patient Represenative (must comment)(Patient's daughter [name].) Choice offered to / list presented to : Adult Children  Discharge Placement PASRR number recieved: 07/26/19            Patient [facility] bed at: [facility] Patient to be transferred to facility by: [facility] Name of family member notified: Patient's daughter [name] is aware of D/C today. Patient and family notified of of transfer: 07/28/19  Discharge Plan and Services In-house Referral: Clinical Social Work Discharge Planning Services: CM Consult Post Acute Care Choice: [place]          DME Arranged: N/A         HH Arranged: NA          Social Determinants of Health (SDOH) Interventions     Readmission Risk Interventions No flowsheet data found.

## 2019-07-28 NOTE — Discharge Instructions (Signed)
Follow-up with primary care physician at the facility in 3 days Follow-up with neurology in 2 -4 weeks

## 2019-07-28 NOTE — Discharge Summary (Signed)
[facility] NAME: Gabrielle Crosby    MR#:  [id number]  DATE OF BIRTH:  [birth year]  DATE OF ADMISSION:  07/25/2019 ADMITTING PHYSICIAN: Lance Coon, MD  DATE OF DISCHARGE:  07/28/19  PRIMARY CARE PHYSICIAN: Toni Arthurs, NP    ADMISSION DIAGNOSIS:  Facial droop [R29.810] Cerebrovascular accident (CVA), unspecified mechanism ([place]) [I63.9]  DISCHARGE DIAGNOSIS:  Principal Problem:   Facial droop Active Problems:   Mixed Alzheimer's and vascular dementia ([place])   Essential (primary) hypertension   HLD (hyperlipidemia)   GERD (gastroesophageal reflux disease)   SECONDARY DIAGNOSIS:   Past Medical History:  Diagnosis Date  . Arthritis    hands  . Bladder cancer ([place])   . Brain bleed ([place]) 2019   d/t light strokes, high bp, seizures. brain has shrunk per dr. Manuella Ghazi and mri  . Cancer ([place])    BLADDER X 2  . Dementia ([place]) 2019   dementia progressing  . Depression   . GERD (gastroesophageal reflux disease)    chokes easily even on own saliva and then has panic attack  . Hyperlipidemia   . Hypertension 2019   no medications.  no htn per daughter  . Seizures [facility])    family not aware of any seizures and no treatment  . Stroke [facility]) 2019   undiagnosed but per neuro, she has had them causing vascular dementia  . Wears dentures    partial upper and lower    HOSPITAL COURSE:   HPI  Gabrielle Crosby  is a 85 y.o. female who presents with chief complaint as above.  Patient presents to the ED after she was noticed by family to have developed left facial droop.  She has dementia at baseline and does not contribute to the HPI.  Her daughter and caretaker is at bedside and provides the story.  She states that she noticed this around dinnertime, but does not know exactly the patient first developed it.  Work-up in the ED is suspicious for stroke, but largely within normal limits.  Telemetry neurology recommended admission with  MRI and neurology consult.  Hospitalist were called for the same  # acute right frontal infarct CT head negative MRI positive acute right frontal infarct Aspirin 81 mg enteric-coated daily at home changed to aspirin 325 mg TEE -ejection fraction of left ventricular 60 to 65% Seen by neurology appreciate Dr. Hart [place] recommendations PT is recommending SNF discharge patient to SNF Monitor patient on telemetry -Dysphagia 1 diet COVID  test is negative  #Essential hypertension Lisinopril is added to the regimen for better control of the blood pressure  #Hyperlipidemia-Lipitor 40 mg   #Alzheimer's and vascular dementia Resume home medications after patient passes bedside swallow evaluation  #GERD-PPI  #Recent UTI with Proteus mirabilis Patient is on p.o. antibiotic Bactrim will continue the same until the course is completed for a total of 5 days  Disposition SNF.  Daughter is agreeable    DISCHARGE CONDITIONS:   fair  CONSULTS OBTAINED:     PROCEDURES  None   DRUG ALLERGIES:   Allergies  Allergen Reactions  . Atenolol Other (See Comments)    Bradycardia   . Donepezil Hcl Other (See Comments)    Mental changes, "made me crazy"    DISCHARGE MEDICATIONS:   Allergies as of 07/28/2019      Reactions   Atenolol Other (See Comments)   Bradycardia    Donepezil Hcl Other (See Comments)   Mental changes, "made me  crazy"      Medication List    STOP taking these medications   lovastatin 20 MG tablet Commonly known as: MEVACOR     TAKE these medications   acetaminophen 500 MG tablet Commonly known as: TYLENOL Take 500 mg by mouth 2 (two) times daily.   aspirin 325 MG EC tablet Take 1 tablet (325 mg total) by mouth daily. Start taking on: July 29, 2019 What changed:   medication strength  how much to take   atorvastatin 40 MG tablet Commonly known as: LIPITOR Take 1 tablet (40 mg total) by mouth daily at 6 PM.   citalopram 20 MG tablet Commonly  known as: CELEXA Take 20 mg by mouth daily.   docusate sodium 50 MG capsule Commonly known as: COLACE Take 50 mg by mouth 2 (two) times daily as needed for mild constipation.   lisinopril 5 MG tablet Commonly known as: ZESTRIL Take 1 tablet (5 mg total) by mouth daily. Start taking on: July 29, 2019   Magnesium 100 MG Caps Take 100 mg by mouth daily.   memantine 10 MG tablet Commonly known as: NAMENDA Take 10 mg by mouth 2 (two) times daily.   QUEtiapine 25 MG tablet Commonly known as: SEROQUEL Take 50 mg by mouth at bedtime.   sulfamethoxazole-trimethoprim 800-160 MG tablet Commonly known as: BACTRIM DS Take 1 tablet by mouth every 12 (twelve) hours. What changed: when to take this   vitamin B-12 1000 MCG tablet Commonly known as: CYANOCOBALAMIN Take 1,000 mcg by mouth daily.   Vitamin D3 50 MCG (2000 UT) capsule Take 2,000 Units by mouth daily.        DISCHARGE INSTRUCTIONS:  Follow-up with primary care physician at the facility in 3 days Follow-up with neurology in 2 -4 weeks   DIET:  Dysphagia 1 diet  DISCHARGE CONDITION:  Fair  ACTIVITY:  Activity as tolerated  OXYGEN:  Home Oxygen: No.   Oxygen Delivery: room air  DISCHARGE LOCATION:  nursing home   If you experience worsening of your admission symptoms, develop shortness of breath, life threatening emergency, suicidal or homicidal thoughts you must seek medical attention immediately by calling 911 or calling your MD immediately  if symptoms less severe.  You Must read complete instructions/literature along with all the possible adverse reactions/side effects for all the Medicines you take and that have been prescribed to you. Take any new Medicines after you have completely understood and accpet all the possible adverse reactions/side effects.   Please note  You were cared for by a hospitalist during your hospital stay. If you have any questions about your discharge medications or the care  you received while you were in the hospital after you are discharged, you can call the unit and asked to speak with the hospitalist on call if the hospitalist that took care of you is not available. Once you are discharged, your primary care physician will handle any further medical issues. Please note that NO REFILLS for any discharge medications will be authorized once you are discharged, as it is imperative that you return to your primary care physician (or establish a relationship with a primary care physician if you do not have one) for your aftercare needs so that they can reassess your need for medications and monitor your lab values.     Today  Chief Complaint  Patient presents with  . Code Stroke   Patient is awake and more alert today.  Agreeable to go to nursing home  ROS: Limited from underlying dementia CONSTITUTIONAL: Denies fevers, chills. Denies any fatigue, weakness.  EYES: Denies blurry vision, double vision, eye pain. EARS, NOSE, THROAT: Denies tinnitus, ear pain, hearing loss. RESPIRATORY: Denies cough, wheeze, shortness of breath.  CARDIOVASCULAR: Denies chest pain, palpitations, edema.  GASTROINTESTINAL: Denies nausea, vomiting, diarrhea, abdominal pain. Denies bright red blood per rectum. SKIN: Denies rash or lesion. MUSCULOSKELETAL: Denies pain in neck, back, shoulder, knees, hips or arthritic symptoms.  NEUROLOGIC: More awake and alert and oriented x1-2 PSYCHIATRIC: Flat affect   VITAL SIGNS:  Blood pressure 119/70, pulse 90, temperature 98 F (36.7 C), temperature source Oral, resp. rate 16, height 5\' 1"  (1.549 m), weight 50 kg, SpO2 99 %.  I/O:    Intake/Output Summary (Last 24 hours) at 07/28/2019 1043 Last data filed at 07/28/2019 1018 Gross per 24 hour  Intake 360 ml  Output 50 ml  Net 310 ml    PHYSICAL EXAMINATION:  GENERAL:  85 y.o.-year-old patient lying in the bed with no acute distress.  EYES: Pupils equal, round, reactive to light and  accommodation. No scleral icterus. Extraocular muscles intact.  HEENT: Head atraumatic, normocephalic. Oropharynx and nasopharynx clear.  NECK:  Supple, no jugular venous distention. No thyroid enlargement, no tenderness.  LUNGS: Normal breath sounds bilaterally, no wheezing, rales,rhonchi or crepitation. No use of accessory muscles of respiration.  CARDIOVASCULAR: S1, S2 normal. No murmurs, rubs, or gallops.  ABDOMEN: Soft, non-tender, non-distended. Bowel sounds present. No organomegaly or mass.  EXTREMITIES: No pedal edema, cyanosis, or clubbing.  NEUROLOGIC: Awake and alert. Muscle strength global weakness in all extremities. Sensation intact. Gait not checked.  PSYCHIATRIC: The patient is alert and oriented x 1.  SKIN: No obvious rash, lesion, or ulcer.   DATA REVIEW:   CBC Recent Labs  Lab 07/25/19 1922  WBC 6.3  HGB 13.6  HCT 42.1  PLT 260    Chemistries  Recent Labs  Lab 07/25/19 1922  NA 139  K 3.9  CL 105  CO2 25  GLUCOSE 105*  BUN 14  CREATININE 0.84  CALCIUM 9.2  AST 19  ALT 12  ALKPHOS 71  BILITOT 0.6    Cardiac Enzymes No results for input(s): TROPONINI in the last 168 hours.  Microbiology Results  Results for orders placed or performed during the hospital encounter of 07/25/19  Novel Coronavirus, NAA (Hosp order, Send-out to Ref Lab; TAT 18-24 hrs     Status: None   Collection Time: 07/25/19  9:04 PM   Specimen: Nasopharyngeal Swab; Respiratory  Result Value Ref Range Status   SARS-CoV-2, NAA NOT DETECTED NOT DETECTED Final    Comment: (NOTE) This test was developed and its performance characteristics determined by [employer]. This test has not been FDA cleared or approved. This test has been authorized by FDA under an Emergency Use Authorization (EUA). This test is only authorized for the duration of time the declaration that circumstances exist justifying the authorization of the emergency use of in vitro diagnostic tests for  detection of SARS-CoV-2 virus and/or diagnosis of COVID-19 infection under section 564(b)(1) of the Act, 21 U.S.C. KA:123727), unless the authorization is terminated or revoked sooner. When diagnostic testing is negative, the possibility of a false negative result should be considered in the context of a patient's recent exposures and the presence of clinical signs and symptoms consistent with COVID-19. An individual without symptoms of COVID-19 and who is not shedding SARS-CoV-2 virus would expect to have a negative (not detected) result in this assay.  Performed  At: [facility] [address] [place], [place] [id number] Rush Farmer MD [id number]    [place]  Final    Comment: Performed at [facility], [place], [place] [id number]    RADIOLOGY:  Ct Angio Head W Or Wo Contrast  Result Date: 07/25/2019 CLINICAL DATA:  Vascular dementia and Alzheimer's dementia. Left facial droop. Difficulty swallowing. Symptoms began 1800 hours EXAM: CT ANGIOGRAPHY HEAD AND NECK CT PERFUSION BRAIN TECHNIQUE: Multidetector CT imaging of the head and neck was performed using the standard protocol during bolus administration of intravenous contrast. Multiplanar CT image reconstructions and MIPs were obtained to evaluate the vascular anatomy. Carotid stenosis measurements (when applicable) are obtained utilizing NASCET criteria, using the distal internal carotid diameter as the denominator. Multiphase CT imaging of the brain was performed following IV bolus contrast injection. Subsequent parametric perfusion maps were calculated using RAPID software. CONTRAST:  117mL OMNIPAQUE IOHEXOL 350 MG/ML SOLN COMPARISON:  Head CT earlier same day.  MRI 01/14/2018 FINDINGS: CTA NECK FINDINGS Aortic arch: Aortic atherosclerosis. No aneurysm or dissection. Branching pattern is normal. Right carotid system: Common carotid artery widely patent to the bifurcation  region. Calcified plaque at the carotid bifurcation and ICA bulb but no stenosis. Cervical ICA widely patent to the upper cervical region. There is irregularity of the vessel at the C1 level consistent with fibromuscular change. Left carotid system: Common carotid artery widely patent to the bifurcation. Calcified plaque at the carotid bifurcation. No stenosis. Cervical ICA is widely patent, with some irregularity in the upper cervical region related to fibromuscular change. Vertebral arteries: No vertebral origin stenosis. Both vertebral arteries appear widely patent through the cervical region to the C1 level, where there is mild irregularity consistent with fibromuscular disease. Skeleton: Ordinary cervical spondylosis. Other neck: No mass or adenopathy. Upper chest: Mild emphysema and pulmonary scarring. Review of the MIP images confirms the above findings CTA HEAD FINDINGS Anterior circulation: Both internal carotid arteries are patent through the skull base and siphon regions. The anterior and middle cerebral vessels do not show proximal stenosis or occlusion. There is no M1 or M2 stenosis. I can not identify a missing M3 branch on either side. Posterior circulation: Both vertebral arteries are widely patent to the basilar. No basilar stenosis. Posterior circulation branch vessels are patent. Venous sinuses: Patent and normal. Anatomic variants: None significant. Review of the MIP images confirms the above findings CT Brain Perfusion Findings: ASPECTS: 10 CBF (<30%) Volume: 43mL Perfusion (Tmax>6.0s) volume: 4mL Mismatch Volume: 62mL Infarction Location:No infarction identified. Right frontoparietal junction region changes consistent with ischemia in a right M3 branch distribution. IMPRESSION: Ordinary atherosclerosis of the arch and neck vessels without stenosis. Irregularity of the internal carotid arteries and vertebral arteries in the upper neck consistent with fibromuscular disease but without evidence of  flow limiting stenosis. No infarction demonstrated by perfusion study. Ischemic change in the right frontoparietal junction region consistent with right M3 branch vessel insult. I can not specifically identify that by CT angiography. Volume measures 8 cc. These results were called by telephone at the time of interpretation on 07/25/2019 at 8:31 pm to Dr. Jimmye Norman, who verbally acknowledged these results. Electronically Signed   By: Nelson Chimes M.D.   On: 07/25/2019 20:34   Ct Angio Neck W Or Wo Contrast  Result Date: 07/25/2019 CLINICAL DATA:  Vascular dementia and Alzheimer's dementia. Left facial droop. Difficulty swallowing. Symptoms began 1800 hours EXAM: CT ANGIOGRAPHY HEAD AND NECK CT PERFUSION BRAIN TECHNIQUE:  Multidetector CT imaging of the head and neck was performed using the standard protocol during bolus administration of intravenous contrast. Multiplanar CT image reconstructions and MIPs were obtained to evaluate the vascular anatomy. Carotid stenosis measurements (when applicable) are obtained utilizing NASCET criteria, using the distal internal carotid diameter as the denominator. Multiphase CT imaging of the brain was performed following IV bolus contrast injection. Subsequent parametric perfusion maps were calculated using RAPID software. CONTRAST:  12mL OMNIPAQUE IOHEXOL 350 MG/ML SOLN COMPARISON:  Head CT earlier same day.  MRI 01/14/2018 FINDINGS: CTA NECK FINDINGS Aortic arch: Aortic atherosclerosis. No aneurysm or dissection. Branching pattern is normal. Right carotid system: Common carotid artery widely patent to the bifurcation region. Calcified plaque at the carotid bifurcation and ICA bulb but no stenosis. Cervical ICA widely patent to the upper cervical region. There is irregularity of the vessel at the C1 level consistent with fibromuscular change. Left carotid system: Common carotid artery widely patent to the bifurcation. Calcified plaque at the carotid bifurcation. No stenosis.  Cervical ICA is widely patent, with some irregularity in the upper cervical region related to fibromuscular change. Vertebral arteries: No vertebral origin stenosis. Both vertebral arteries appear widely patent through the cervical region to the C1 level, where there is mild irregularity consistent with fibromuscular disease. Skeleton: Ordinary cervical spondylosis. Other neck: No mass or adenopathy. Upper chest: Mild emphysema and pulmonary scarring. Review of the MIP images confirms the above findings CTA HEAD FINDINGS Anterior circulation: Both internal carotid arteries are patent through the skull base and siphon regions. The anterior and middle cerebral vessels do not show proximal stenosis or occlusion. There is no M1 or M2 stenosis. I can not identify a missing M3 branch on either side. Posterior circulation: Both vertebral arteries are widely patent to the basilar. No basilar stenosis. Posterior circulation branch vessels are patent. Venous sinuses: Patent and normal. Anatomic variants: None significant. Review of the MIP images confirms the above findings CT Brain Perfusion Findings: ASPECTS: 10 CBF (<30%) Volume: 49mL Perfusion (Tmax>6.0s) volume: 58mL Mismatch Volume: 46mL Infarction Location:No infarction identified. Right frontoparietal junction region changes consistent with ischemia in a right M3 branch distribution. IMPRESSION: Ordinary atherosclerosis of the arch and neck vessels without stenosis. Irregularity of the internal carotid arteries and vertebral arteries in the upper neck consistent with fibromuscular disease but without evidence of flow limiting stenosis. No infarction demonstrated by perfusion study. Ischemic change in the right frontoparietal junction region consistent with right M3 branch vessel insult. I can not specifically identify that by CT angiography. Volume measures 8 cc. These results were called by telephone at the time of interpretation on 07/25/2019 at 8:31 pm to Dr. Jimmye Norman,  who verbally acknowledged these results. Electronically Signed   By: Nelson Chimes M.D.   On: 07/25/2019 20:34   Mr Brain Wo Contrast  Result Date: 07/26/2019 CLINICAL DATA:  Vascular dementia and Alzheimer's dementia. Stroke. Left facial droop. EXAM: MRI HEAD WITHOUT CONTRAST TECHNIQUE: Multiplanar, multiecho pulse sequences of the brain and surrounding structures were obtained without intravenous contrast. COMPARISON:  CTA and CT perfusion 07/25/2019.  MRI brain 01/14/2018 FINDINGS: Brain: Acute infarct in the right posterior frontal cortex as well as a small acute infarct in the operculum on the right. This corresponds to the area of delayed perfusion ischemia on CT perfusion yesterday. Small acute infarct right frontal white matter. Moderate atrophy with ventricular enlargement, stable. Chronic infarct left occipital lobe. Chronic ischemic changes in the white matter have progressed since 01/14/2018. Progression of multiple areas  of chronic microhemorrhage in the brain in the right frontal lobe and right convexity. There is chronic hemorrhage over the right convexity, question history of head injury and subarachnoid hemorrhage. Vascular: Normal arterial flow voids. Skull and upper cervical spine: Negative Sinuses/Orbits: Bilateral cataract surgery. Paranasal sinuses clear. Other: None IMPRESSION: 1. Acute infarct right posterior frontal cortex and adjacent white matter. Small area of acute infarct in the operculum on the right. 2. Moderate atrophy. Progressive chronic ischemic changes since 01/14/2018. Progression of multiple areas of chronic microhemorrhage in the right cerebral hemisphere as well as chronic subarachnoid hemorrhage over the convexity in the right. Question interval head injury. Electronically Signed   By: Franchot Gallo M.D.   On: 07/26/2019 10:38   US Carotid Bilateral (at [facility] And [facility] Only)  Result Date: 07/26/2019 CLINICAL DATA:  Facial droop.  Hypertension, hyperlipidemia. EXAM:  BILATERAL CAROTID DUPLEX ULTRASOUND TECHNIQUE: Pearline Cables scale imaging, color Doppler and duplex ultrasound were performed of bilateral carotid and vertebral arteries in the neck. COMPARISON:  CT 07/25/2019 FINDINGS: Criteria: Quantification of carotid stenosis is based on velocity parameters that correlate the residual internal carotid diameter with NASCET-based stenosis levels, using the diameter of the distal internal carotid lumen as the denominator for stenosis measurement. The following velocity measurements were obtained: RIGHT ICA: 85/13 cm/sec CCA: [id number] cm/sec SYSTOLIC ICA/CCA RATIO:  1.3 ECA: 71 cm/sec LEFT ICA: 97/17 cm/sec CCA: [id number] cm/sec SYSTOLIC ICA/CCA RATIO:  1.6 ECA: 77 cm/sec RIGHT CAROTID ARTERY: Mild tortuosity. Intimal thickening through the common carotid artery. Partially calcified plaque at the carotid bifurcation and ICA origin without high-grade stenosis. Normal waveforms and color Doppler signal. RIGHT VERTEBRAL ARTERY:  Normal flow direction and waveform. LEFT CAROTID ARTERY: Intimal thickening through the common carotid artery. Mild plaque in the proximal internal and external carotid arteries without high-grade stenosis. Normal waveforms and color Doppler signal. LEFT VERTEBRAL ARTERY:  Normal flow direction and waveform. IMPRESSION: 1. Bilateral carotid bifurcation plaque resulting in less than 50% diameter ICA stenosis. 2. Antegrade bilateral vertebral arterial flow. Electronically Signed   By: Lucrezia Europe M.D.   On: 07/26/2019 10:37   Ct Cerebral Perfusion W Contrast  Result Date: 07/25/2019 CLINICAL DATA:  Vascular dementia and Alzheimer's dementia. Left facial droop. Difficulty swallowing. Symptoms began 1800 hours EXAM: CT ANGIOGRAPHY HEAD AND NECK CT PERFUSION BRAIN TECHNIQUE: Multidetector CT imaging of the head and neck was performed using the standard protocol during bolus administration of intravenous contrast. Multiplanar CT image reconstructions and MIPs were obtained to  evaluate the vascular anatomy. Carotid stenosis measurements (when applicable) are obtained utilizing NASCET criteria, using the distal internal carotid diameter as the denominator. Multiphase CT imaging of the brain was performed following IV bolus contrast injection. Subsequent parametric perfusion maps were calculated using RAPID software. CONTRAST:  167mL OMNIPAQUE IOHEXOL 350 MG/ML SOLN COMPARISON:  Head CT earlier same day.  MRI 01/14/2018 FINDINGS: CTA NECK FINDINGS Aortic arch: Aortic atherosclerosis. No aneurysm or dissection. Branching pattern is normal. Right carotid system: Common carotid artery widely patent to the bifurcation region. Calcified plaque at the carotid bifurcation and ICA bulb but no stenosis. Cervical ICA widely patent to the upper cervical region. There is irregularity of the vessel at the C1 level consistent with fibromuscular change. Left carotid system: Common carotid artery widely patent to the bifurcation. Calcified plaque at the carotid bifurcation. No stenosis. Cervical ICA is widely patent, with some irregularity in the upper cervical region related to fibromuscular change. Vertebral arteries: No vertebral origin stenosis. Both vertebral arteries  appear widely patent through the cervical region to the C1 level, where there is mild irregularity consistent with fibromuscular disease. Skeleton: Ordinary cervical spondylosis. Other neck: No mass or adenopathy. Upper chest: Mild emphysema and pulmonary scarring. Review of the MIP images confirms the above findings CTA HEAD FINDINGS Anterior circulation: Both internal carotid arteries are patent through the skull base and siphon regions. The anterior and middle cerebral vessels do not show proximal stenosis or occlusion. There is no M1 or M2 stenosis. I can not identify a missing M3 branch on either side. Posterior circulation: Both vertebral arteries are widely patent to the basilar. No basilar stenosis. Posterior circulation branch  vessels are patent. Venous sinuses: Patent and normal. Anatomic variants: None significant. Review of the MIP images confirms the above findings CT Brain Perfusion Findings: ASPECTS: 10 CBF (<30%) Volume: 45mL Perfusion (Tmax>6.0s) volume: 8mL Mismatch Volume: 59mL Infarction Location:No infarction identified. Right frontoparietal junction region changes consistent with ischemia in a right M3 branch distribution. IMPRESSION: Ordinary atherosclerosis of the arch and neck vessels without stenosis. Irregularity of the internal carotid arteries and vertebral arteries in the upper neck consistent with fibromuscular disease but without evidence of flow limiting stenosis. No infarction demonstrated by perfusion study. Ischemic change in the right frontoparietal junction region consistent with right M3 branch vessel insult. I can not specifically identify that by CT angiography. Volume measures 8 cc. These results were called by telephone at the time of interpretation on 07/25/2019 at 8:31 pm to Dr. Jimmye Norman, who verbally acknowledged these results. Electronically Signed   By: Nelson Chimes M.D.   On: 07/25/2019 20:34   Ct Head Code Stroke Wo Contrast  Result Date: 07/25/2019 CLINICAL DATA:  Code stroke. Vascular dementia and Alzheimer's dementia. Left facial droop and speech disturbance beginning after 1700 hours EXAM: CT HEAD WITHOUT CONTRAST TECHNIQUE: Contiguous axial images were obtained from the base of the skull through the vertex without intravenous contrast. COMPARISON:  01/14/2018.  12/13/2014. FINDINGS: Brain: Brainstem and cerebellum are unremarkable. Cerebral hemispheres show advanced generalized atrophy with temporal predominance. Chronic small-vessel ischemic changes throughout the cerebral hemispheric deep and subcortical white matter. Old left parieto-occipital cortical infarction. Small old left frontal infarction. No sign of acute infarction, mass lesion, hemorrhage, hydrocephalus or extra-axial  collection. Vascular: There is atherosclerotic calcification of the major vessels at the base of the brain. Skull: Negative Sinuses/Orbits: Clear/normal Other: None ASPECTS ([place] Stroke Program Early CT Score) - Ganglionic level infarction (caudate, lentiform nuclei, internal capsule, insula, M1-M3 cortex): 7 - Supraganglionic infarction (M4-M6 cortex): 3 Total score (0-10 with 10 being normal): 10 IMPRESSION: 1. No acute finding by CT. Brain atrophy with temporal lobe predominance. Extensive chronic small-vessel ischemic changes throughout the cerebral hemispheres. Old left frontal and parietooccipital infarctions. 2. ASPECTS is 10. 3. These results were called by telephone at the time of interpretation on 07/25/2019 at 7:16 pm to Dr. Lenise Arena , who verbally acknowledged these results. Electronically Signed   By: Nelson Chimes M.D.   On: 07/25/2019 19:19    EKG:   Orders placed or performed during the hospital encounter of 07/25/19  . ED EKG  . ED EKG  . EKG 12-Lead  . EKG 12-Lead      Management plans discussed with the patient, family and they are in agreement.  CODE STATUS:     Code Status Orders  (From admission, onward)         Start     Ordered   07/25/19 2359  Full code  Continuous     07/25/19 2358        Code Status History    Date Active Date Inactive Code Status Order ID Comments User Context   11/03/2018 1553 11/03/2018 1941 Full Code [id number]  Hessie Knows, MD Inpatient   04/21/2018 1346 04/21/2018 1652 Full Code [id number]  Hessie Knows, MD Inpatient   12/20/2017 1307 12/20/2017 1644 Full Code [id number]  Hessie Knows, MD Inpatient   Advance Care Planning Activity    Advance Directive Documentation     Most Recent Value  Type of Advance Directive  Healthcare Power of Attorney  Pre-existing out of facility DNR order (yellow form or pink MOST form)  -  "MOST" Form in Place?  -      TOTAL TIME TAKING CARE OF THIS PATIENT: 45 minutes.   Note: This  dictation was prepared with Dragon dictation along with smaller phrase technology. Any transcriptional errors that result from this process are unintentional.   @MEC @  on 07/28/2019 at 10:43 AM  Between 7am to 6pm - Pager - [phone]  After 6pm go to [email or web address] - password [email or web address] [facility]  Office  [phone]  CC: Primary care physician; Toni Arthurs, NP

## 2019-07-28 NOTE — Progress Notes (Signed)
OT Cancellation Note  Patient Details Name: KAIRI ARCHILLA MRN: [id number] DOB: Oct 21, 1933   Cancelled Treatment:    Reason Eval/Treat Not Completed: Other (comment). PT working with pt upon attempt. Will re-attempt OT tx at later date/time as pt is available and medically appropriate.   Jeni Salles, MPH, MS, OTR/L ascom [phone] 07/28/19, 9:28 AM

## 2019-07-28 NOTE — Progress Notes (Signed)
Physical Therapy Treatment Patient Details Name: Gabrielle Crosby MRN: [id number] DOB: 06/28/1933 Today's Date: 07/28/2019    History of Present Illness Pt is 84 yo female with PMH of bladder cancer, brain bleeds, dementia, depression, HLD, HTN, seizures, stroke, presented to ED for facial droop. MRI showed acute infarct right posterior frontal cortex and adjacent white matter. Small area of acute infarct in the operculum on the right.    PT Comments    Pt is making good progress towards goals with improved functional independence with L hemibody. Does fatigue with exertional activity and quality of there-ex decreases with increased reps. Follows commands generally well. Does well with tactile cues. Sat in recliner, however has decreased safety awareness, RN came in and preferred pt to be placed in bed. Returned to bed, alarm turned on. Will continue to progress as tolerated.  Follow Up Recommendations  SNF     Equipment Recommendations  Rolling walker with 5" wheels    Recommendations for Other Services       Precautions / Restrictions Precautions Precautions: Fall Restrictions Weight Bearing Restrictions: No    Mobility  Bed Mobility Overal bed mobility: Needs Assistance Bed Mobility: Supine to Sit;Rolling     Supine to sit: Min guard     General bed mobility comments: pt somewhat impulsive and tries to get OOB with rails up. Does respond well when told to wait until rails are down. Once seated at EOB, able to maintain position with supervision  Transfers Overall transfer level: Needs assistance Equipment used: 1 person hand held assist Transfers: Sit to/from Stand Sit to Stand: Min guard         General transfer comment: safe technique with upright posture  Ambulation/Gait Ambulation/Gait assistance: Min guard Gait Distance (Feet): 100 Feet Assistive device: 1 person hand held assist Gait Pattern/deviations: Step-through pattern     General Gait Details:  short reciprocal gait pattern, able to increase step size with cues, however inconsistent. Has difficulty with directional changes, most likely to lose balance during turns. Needs constant cga ->min assist for ambulation.    Stairs             Wheelchair Mobility    Modified Rankin (Stroke Patients Only)       Balance Overall balance assessment: Needs assistance Sitting-balance support: No upper extremity supported;Feet unsupported Sitting balance-Leahy Scale: Fair     Standing balance support: Single extremity supported Standing balance-Leahy Scale: Fair                              Cognition Arousal/Alertness: Awake/alert Behavior During Therapy: WFL for tasks assessed/performed Overall Cognitive Status: History of cognitive impairments - at baseline                                        Exercises Other Exercises Other Exercises: able to perform seated ther-ex including B LE alt. marching, LAQ, SLRs, and hip abd/add. All ther-ex performed x 10 reps with min assist for performance    General Comments        Pertinent Vitals/Pain Pain Assessment: No/denies pain    Home Living                      Prior Function            PT Goals (current goals can now be  found in the care plan section) Acute Rehab PT Goals Patient Stated Goal: to get strong PT Goal Formulation: Patient unable to participate in goal setting Time For Goal Achievement: 08/09/19 Potential to Achieve Goals: Fair Progress towards PT goals: Progressing toward goals    Frequency    7X/week      PT Plan Current plan remains appropriate    Co-evaluation              AM-PAC PT "6 Clicks" Mobility   Outcome Measure  Help needed turning from your back to your side while in a flat bed without using bedrails?: A Little Help needed moving from lying on your back to sitting on the side of a flat bed without using bedrails?: A Little Help needed  moving to and from a bed to a chair (including a wheelchair)?: A Little Help needed standing up from a chair using your arms (e.g., wheelchair or bedside chair)?: A Little Help needed to walk in hospital room?: A Little Help needed climbing 3-5 steps with a railing? : A Little 6 Click Score: 18    End of Session Equipment Utilized During Treatment: Gait belt Activity Tolerance: Patient tolerated treatment well Patient left: in bed;with bed alarm set Nurse Communication: Mobility status PT Visit Diagnosis: Unsteadiness on feet (R26.81);Other abnormalities of gait and mobility (R26.89);Muscle weakness (generalized) (M62.81);Other symptoms and signs involving the nervous system [id number])     Time: [id number] PT Time Calculation (min) (ACUTE ONLY): 23 min  Charges:  $Gait Training: 8-22 mins $Therapeutic Exercise: 8-22 mins                     Greggory Stallion, PT, DPT [phone]    Gabrielle Crosby 07/28/2019, 10:33 AM

## 2019-07-28 NOTE — Care Management Important Message (Signed)
Important Message  Patient Details  Name: Gabrielle Crosby MRN: [id number] Date of Birth: Sep 12, 1933   Medicare Important Message Given:  Yes     Juliann Pulse A Zeenat Jeanbaptiste 07/28/2019, 11:00 AM

## 2019-07-28 NOTE — Progress Notes (Signed)
Patient discharged to [facility] per MD order. Report called to [place] at facility. EMS called for transport

## 2019-08-01 ENCOUNTER — Telehealth: Payer: Self-pay | Admitting: Primary Care

## 2019-08-01 NOTE — Telephone Encounter (Signed)
T/c to daughter [name] who stated patient was lined up to go to [facility] memory care, but had a CVA on 8/26. She is currently in [facility] for rehab.  I will reach out to see if they'd like a palliative consultation at [facility]. They still plan to send her to [facility] once finished with rehab.

## 2019-08-02 ENCOUNTER — Other Ambulatory Visit: Payer: Self-pay

## 2019-08-02 ENCOUNTER — Encounter: Payer: Self-pay | Admitting: Nurse Practitioner

## 2019-08-02 ENCOUNTER — Non-Acute Institutional Stay: Payer: Medicare Other | Admitting: Nurse Practitioner

## 2019-08-02 VITALS — BP 129/54 | HR 89 | Temp 98.0°F | Resp 18 | Wt 102.9 lb

## 2019-08-02 DIAGNOSIS — Z515 Encounter for palliative care: Secondary | ICD-10-CM

## 2019-08-02 NOTE — Progress Notes (Signed)
[place] Consult Note Telephone: [phone]  Fax: [phone]  PATIENT NAME: Gabrielle Crosby DOB: [birth year] MRN: [id number]  PRIMARY CARE PROVIDER:   Toni Arthurs, NP  REFERRING PROVIDER:  Dr Hodges/[place]:   [name] daughter [phone]  I was asked by Dr Nyra Capes to see Gabrielle Crosby for Palliative care consult for goals of care.  RECOMMENDATIONS and PLAN:  1. ACP: Full code; aggressive interventions. Call pending to daughter for further discussion of goc, code status  2. Generalized weakness  secondary to continue with therapy as able. Encourage energy conservation and rest times.  3. Palliative care encounter Palliative medicine team will continue to support patient, patient's family, and medical team. Visit consisted of counseling and education dealing with the complex and emotionally intense issues of symptom management and palliative care in the setting of serious and potentially life-threatening illness  I spent 60 minutes providing this consultation,  from 10:30 to 11:30am. More than 50% of the time in this consultation was spent coordinating communication.   HISTORY OF PRESENT ILLNESS:  Gabrielle Crosby is a 85 y.o. year old female with multiple medical problems including Stroke, seizure disorder, Alzheimer's with  vascular dementia, bladder cancer, arthritis, hypertension, hyperlipidemia, GERD, depression. She was hospitalized 8 / 25 / 2020 to 8 / 28 / 2020 for left facial droop. Workup significant for acute right frontal infarct with positive MRI and CT had negative. She was started on aspirin. She had a recent urinary tract infection and treated with antibiotic therapy for what she continues to take. She was discharged for Short-Term Rehab. Gabrielle Crosby is currently residing at [place] for Short-Term Rehab at [facility]. She does  require assistance for transfers, adl's. She feeds herself after tray setup. At present she is sitting in the wheelchair in her room. She is oriented to self otherwise appears confused. No visitors present. I visited and observed Gabrielle Crosby. Talked about purpose for palliative care visit but it was difficult for her to understand. She denied symptoms of pain although her roommate endorses that she's been complaining of a headache. When asked specifically about a headache she replied that her head does not hurt. We talked about her appetite and she said that she's eating. She does not remember what she ate today. Asked if she was walking and she had difficulty answering that question. She was cooperative with assessment. Emotional support provided. I have attempted to contact her daughter, will continue for discussion of goc, code status. Palliative Care was asked to help address goals of care.   CODE STATUS: Full code  PPS: 40% HOSPICE ELIGIBILITY/DIAGNOSIS: TBD  PAST MEDICAL HISTORY:  Past Medical History:  Diagnosis Date   Arthritis    hands   Bladder cancer ([place])    Brain bleed ([place]) 2019   d/t light strokes, high bp, seizures. brain has shrunk per dr. Manuella Ghazi and mri   Cancer [facility])    BLADDER X 2   Dementia ([place]) 2019   dementia progressing   Depression    GERD (gastroesophageal reflux disease)    chokes easily even on own saliva and then has panic attack   Hyperlipidemia    Hypertension 2019   no medications.  no htn per daughter   Seizures [facility])    family not aware of any seizures and no treatment   Stroke [facility]) 2019   undiagnosed but per neuro, she has had  them causing vascular dementia   Wears dentures    partial upper and lower    SOCIAL HX:  Social History   Tobacco Use   Smoking status: Never Smoker   Smokeless tobacco: Never Used  Substance Use Topics   Alcohol use: Never    Alcohol/week: 0.0 standard drinks    Frequency: Never    ALLERGIES:    Allergies  Allergen Reactions   Atenolol Other (See Comments)    Bradycardia    Donepezil Hcl Other (See Comments)    Mental changes, "made me crazy"     PERTINENT MEDICATIONS:  Outpatient Encounter Medications as of 08/02/2019  Medication Sig   acetaminophen (TYLENOL) 500 MG tablet Take 500 mg by mouth 2 (two) times daily.    aspirin EC 325 MG EC tablet Take 1 tablet (325 mg total) by mouth daily.   atorvastatin (LIPITOR) 40 MG tablet Take 1 tablet (40 mg total) by mouth daily at 6 PM.   Cholecalciferol (VITAMIN D3) 50 MCG (2000 UT) capsule Take 2,000 Units by mouth daily.    citalopram (CELEXA) 20 MG tablet Take 20 mg by mouth daily.    docusate sodium (COLACE) 50 MG capsule Take 50 mg by mouth 2 (two) times daily as needed for mild constipation.   lisinopril (ZESTRIL) 5 MG tablet Take 1 tablet (5 mg total) by mouth daily.   Magnesium 100 MG CAPS Take 100 mg by mouth daily.    memantine (NAMENDA) 10 MG tablet Take 10 mg by mouth 2 (two) times daily.    QUEtiapine (SEROQUEL) 25 MG tablet Take 50 mg by mouth at bedtime.    sulfamethoxazole-trimethoprim (BACTRIM DS) 800-160 MG tablet Take 1 tablet by mouth every 12 (twelve) hours.   vitamin B-12 (CYANOCOBALAMIN) 1000 MCG tablet Take 1,000 mcg by mouth daily.   No facility-administered encounter medications on file as of 08/02/2019.     PHYSICAL EXAM:   General: NAD, frail appearing, thin, elderly confused female Cardiovascular: regular rate and rhythm Pulmonary: clear ant fields Abdomen: soft, nontender, + bowel sounds Extremities: no edema, no joint deformities Neurological: Weakness but otherwise nonfocal  Marlene Beidler Ihor Gully, NP

## 2019-08-04 ENCOUNTER — Encounter: Payer: Self-pay | Admitting: Nurse Practitioner

## 2019-08-04 ENCOUNTER — Non-Acute Institutional Stay: Payer: Medicare Other | Admitting: Nurse Practitioner

## 2019-08-04 ENCOUNTER — Other Ambulatory Visit: Payer: Self-pay

## 2019-08-04 VITALS — BP 129/54 | HR 89 | Temp 96.8°F | Resp 18 | Wt 102.9 lb

## 2019-08-04 DIAGNOSIS — Z515 Encounter for palliative care: Secondary | ICD-10-CM

## 2019-08-04 NOTE — Progress Notes (Signed)
[place] Consult Note Telephone: [phone]  Fax: [phone]  PATIENT NAME: Gabrielle Crosby DOB: 1933/06/16 MRN: [id number]  PRIMARY CARE PROVIDER:   Toni Arthurs, NP  REFERRING PROVIDER:  Dr Hodges/[place]:   Gabrielle Crosby daughter [phone]  RECOMMENDATIONS and PLAN:  1. ACP: Full code; aggressive interventions. Call pending to daughter for further discussion of goc, code status  2. Generalized weakness secondary to continue with therapy as able. Encourage energy conservation and rest times.  3. Palliative care encounter Palliative medicine team will continue to support patient, patient's family, and medical team. Visit consisted of counseling and education dealing with the complex and emotionally intense issues of symptom management and palliative care in the setting of serious and potentially life-threatening illness  I spent 45 minutes providing this consultation,  from 9:45am to 10:30am. More than 50% of the time in this consultation was spent coordinating communication.   HISTORY OF PRESENT ILLNESS:  Gabrielle Crosby is a 85 y.o. year old female with multiple medical problems including Stroke, seizure disorder, Alzheimer's with vascular dementia, bladder cancer, arthritis, hypertension, hyperlipidemia, GERD, depression. She was hospitalized 8 / 25 / 2020 to 8 / 28 / 2020 for left facial droop. Workup significant for acute right frontal infarct with positive MRI and CT had negative. She was started on aspirin. She had a recent urinary tract infection and treated with antibiotic therapy for what she continues to take. She was discharged for Short-Term Rehab. [name] continues to reside at [place] for Short-Term Rehab at [facility]. She is requiring assistance for mobility, transferring. She is ambulatory with a walker and physical therapy. Requires  assistance for adl's. She feeds herself an appetite has continued to be declined. Staff endorses that she does have some difficulty sleeping at night. Staff and door says she sits at the nurses station and tries to help the staff. At present she is sitting at the nurses station. She appears thin, comfortable, smiling and interactive though confused. No visitors present. I visited and observed [name]. No meaningful discussion with cognitive impairment. She was cooperative with assessment. She does appear to be improving with her strength, ambulating. I did call her daughter Gabrielle Crosby. We talked about purpose of palliative care visit and Gabrielle Crosby in agreement. We talked about the last time Gabrielle Crosby resided at home independently. We talked about past medical history in the same chronic disease of Alzheimer's with vascular dementia. We talked about the progression of the disease. We talked about Gabrielle Crosby's husband, Gabrielle's father's passing. Since that time about 2 years ago Ms. Crosby has been residing with Gabrielle Crosby and her family. We talked about her functional ability ambulating with a walker. We talked about requiring assistance for adl's. Gabrielle Crosby endorces she was making arrangements to have her placed in Assisted Living facility and then she ended up hospitalized and transitioning to short-term rehab at [facility]. Gabrielle Crosby endorses the plan is if she is able to go to Assisted Living when Short-Term Rehab is completed that's what her hope is. We talked about her current functional level. ambulatory and she seems to be increasing in her strength that maybe a good transition. We talked about medical goals of care including aggressive versus conservative versus comfort care. Gabrielle Crosby endorses that she would not want her on a ventilator. Currently she is a full code. We talked about code status and Gabrielle Crosby wishes for her  to be a do not resuscitate. [place] at a facility form completed and given to  staff, also placed in Epic/Vynca. We talked about wants to transition to Assisted Living palliative care can continue to follow. We talked about role of palliative care and plan of care. Gabrielle Crosby in agreement. Discuss follow-up palliative care visit in one week if needed or sooner should she declined. Gabrielle Crosby endorses that she herself after kidney stone surgery and has been having a hard time with that. Therapeutic listening and emotional support provided. Questions answered dissatisfaction. Contact information provided. I updated nursing staff in addition to giving unit manager DNR [place] form.Palliative Care was asked to help to continue to address goals of care.   CODE STATUS: DNR  PPS: 40% HOSPICE ELIGIBILITY/DIAGNOSIS: TBD  PAST MEDICAL HISTORY:  Past Medical History:  Diagnosis Date   Arthritis    hands   Bladder cancer ([place])    Brain bleed ([place]) 2019   d/t light strokes, high bp, seizures. brain has shrunk per dr. Manuella Ghazi and mri   Cancer [facility])    BLADDER X 2   Dementia ([place]) 2019   dementia progressing   Depression    GERD (gastroesophageal reflux disease)    chokes easily even on own saliva and then has panic attack   Hyperlipidemia    Hypertension 2019   no medications.  no htn per daughter   Seizures [facility])    family not aware of any seizures and no treatment   Stroke [facility]) 2019   undiagnosed but per neuro, she has had them causing vascular dementia   Wears dentures    partial upper and lower    SOCIAL HX:  Social History   Tobacco Use   Smoking status: Never Smoker   Smokeless tobacco: Never Used  Substance Use Topics   Alcohol use: Never    Alcohol/week: 0.0 standard drinks    Frequency: Never    ALLERGIES:  Allergies  Allergen Reactions   Atenolol Other (See Comments)    Bradycardia    Donepezil Hcl Other (See Comments)    Mental changes, "made me crazy"     PERTINENT MEDICATIONS:  Outpatient Encounter Medications as of 08/04/2019    Medication Sig   acetaminophen (TYLENOL) 500 MG tablet Take 500 mg by mouth 2 (two) times daily.    aspirin EC 325 MG EC tablet Take 1 tablet (325 mg total) by mouth daily.   atorvastatin (LIPITOR) 40 MG tablet Take 1 tablet (40 mg total) by mouth daily at 6 PM.   Cholecalciferol (VITAMIN D3) 50 MCG (2000 UT) capsule Take 2,000 Units by mouth daily.    citalopram (CELEXA) 20 MG tablet Take 20 mg by mouth daily.    docusate sodium (COLACE) 50 MG capsule Take 50 mg by mouth 2 (two) times daily as needed for mild constipation.   lisinopril (ZESTRIL) 5 MG tablet Take 1 tablet (5 mg total) by mouth daily.   Magnesium 100 MG CAPS Take 100 mg by mouth daily.    memantine (NAMENDA) 10 MG tablet Take 10 mg by mouth 2 (two) times daily.    QUEtiapine (SEROQUEL) 25 MG tablet Take 50 mg by mouth at bedtime.    sulfamethoxazole-trimethoprim (BACTRIM DS) 800-160 MG tablet Take 1 tablet by mouth every 12 (twelve) hours.   vitamin B-12 (CYANOCOBALAMIN) 1000 MCG tablet Take 1,000 mcg by mouth daily.   No facility-administered encounter medications on file as of 08/04/2019.     PHYSICAL EXAM:   General: NAD, frail  appearing, thin, confused, elderly female Cardiovascular: regular rate and rhythm Pulmonary: clear ant fields Abdomen: soft, nontender, + bowel sounds Extremities: no edema, no joint deformities Neurological: Weakness but otherwise nonfocal/ambulatory with walker  Austine Wiedeman Ihor Gully, NP

## 2019-08-16 ENCOUNTER — Encounter: Payer: Self-pay | Admitting: Nurse Practitioner

## 2019-08-16 ENCOUNTER — Non-Acute Institutional Stay: Payer: Medicare Other | Admitting: Nurse Practitioner

## 2019-08-16 ENCOUNTER — Other Ambulatory Visit: Payer: Self-pay

## 2019-08-16 VITALS — BP 129/54 | HR 89 | Temp 97.9°F | Resp 18 | Wt 101.4 lb

## 2019-08-16 DIAGNOSIS — Z515 Encounter for palliative care: Secondary | ICD-10-CM

## 2019-08-16 NOTE — Progress Notes (Signed)
[facility] Consult Note Telephone: [phone]  Fax: [phone]  PATIENT NAME: Gabrielle Crosby DOB: January 31, 1933 MRN: [id number]  PRIMARY CARE PROVIDER:   Toni Arthurs, NP  REFERRING PROVIDER:  Dr Hodges/[place] Ewa Gentry:   [name] daughter [id number]  RECOMMENDATIONS and PLAN: 1.ACP: DNR;   2.Generalized weaknessimprovingsecondary to continue with therapy as able. Encourage energy conservation and rest times.  3.Palliative care encounter Palliative medicine team will continue to support patient, patient's family, and medical team. Visit consisted of counseling and education dealing with the complex and emotionally intense issues of symptom management and palliative care in the setting of serious and potentially life-threatening illness I spent 30 minutes providing this consultation,  from 1:00pm to 1:30pm,. More than 50% of the time in this consultation was spent coordinating communication.   HISTORY OF PRESENT ILLNESS:  Gabrielle Crosby is a 85 y.o. year old female with multiple medical problems including Stroke, seizure disorder, Alzheimer's withvascular dementia, bladder cancer, arthritis, hypertension, hyperlipidemia, GERD, depression. She was hospitalized 8 / 25 / 2020 to 8 / 28 / 2020 for left facial droop. Workup significant for acute right frontal infarct with positive MRI and CT had negative. She was started on aspirin. Ms. Fazzone continues to reside at [place] at [facility]. She does ambulate with supervision from staff. She does require assistance with ADLs. She has episodes of incontinence. She is oriented to self but otherwise confuse. No recent seizure activity, falls or rehospitalization. Ms Pepple does remain a do not resuscitate. 9 / 15 / 2020 care plan meeting was held with daughter with no changes to current plan of care. At present Ms  Matchette is lying in bed. She appears pleasantly confused. No visitors present. I visited and observed Ms Silverio. We talked about purpose for palliative care visit well she made eye contact. We talked about how she was feeling today. She verbalize that she is doing well. We talked about her appetite. We talked about foods that she likes. Limited verbal discussion with cognitive impairment. She was cooperative with assessment. She does continue to appear stable at present time. We'll continue to follow a monitor with palliative care and next visit in 4 weeks if needed or sooner should she declined. I have attempted to contact her daughter for updated on palliative care visit and will follow up with social worker about plans for either transitioning to long-term care or Assisted Living from previous palliative care family discussion. I have updated nursing staff. Palliative Care was asked to help to continue to address goals of care.   CODE STATUS: DNR  PPS: 40% HOSPICE ELIGIBILITY/DIAGNOSIS: TBD  PAST MEDICAL HISTORY:  Past Medical History:  Diagnosis Date   Arthritis    hands   Bladder cancer ([place])    Brain bleed ([place]) 2019   d/t light strokes, high bp, seizures. brain has shrunk per dr. Manuella Ghazi and mri   Cancer [facility])    BLADDER X 2   Dementia ([place]) 2019   dementia progressing   Depression    GERD (gastroesophageal reflux disease)    chokes easily even on own saliva and then has panic attack   Hyperlipidemia    Hypertension 2019   no medications.  no htn per daughter   Seizures [facility])    family not aware of any seizures and no treatment   Stroke [facility]) 2019   undiagnosed but per neuro, she has had them  causing vascular dementia   Wears dentures    partial upper and lower    SOCIAL HX:  Social History   Tobacco Use   Smoking status: Never Smoker   Smokeless tobacco: Never Used  Substance Use Topics   Alcohol use: Never    Alcohol/week: 0.0 standard drinks     Frequency: Never    ALLERGIES:  Allergies  Allergen Reactions   Atenolol Other (See Comments)    Bradycardia    Donepezil Hcl Other (See Comments)    Mental changes, "made me crazy"     PERTINENT MEDICATIONS:  Outpatient Encounter Medications as of 08/16/2019  Medication Sig   acetaminophen (TYLENOL) 500 MG tablet Take 500 mg by mouth 2 (two) times daily.    aspirin EC 325 MG EC tablet Take 1 tablet (325 mg total) by mouth daily.   atorvastatin (LIPITOR) 40 MG tablet Take 1 tablet (40 mg total) by mouth daily at 6 PM.   Cholecalciferol (VITAMIN D3) 50 MCG (2000 UT) capsule Take 2,000 Units by mouth daily.    citalopram (CELEXA) 20 MG tablet Take 20 mg by mouth daily.    docusate sodium (COLACE) 50 MG capsule Take 50 mg by mouth 2 (two) times daily as needed for mild constipation.   lisinopril (ZESTRIL) 5 MG tablet Take 1 tablet (5 mg total) by mouth daily.   Magnesium 100 MG CAPS Take 100 mg by mouth daily.    memantine (NAMENDA) 10 MG tablet Take 10 mg by mouth 2 (two) times daily.    QUEtiapine (SEROQUEL) 25 MG tablet Take 50 mg by mouth at bedtime.    sulfamethoxazole-trimethoprim (BACTRIM DS) 800-160 MG tablet Take 1 tablet by mouth every 12 (twelve) hours.   vitamin B-12 (CYANOCOBALAMIN) 1000 MCG tablet Take 1,000 mcg by mouth daily.   No facility-administered encounter medications on file as of 08/16/2019.     PHYSICAL EXAM:   General: NAD, frail appearing, thin, confused female Cardiovascular: regular rate and rhythm Pulmonary: clear ant fields Abdomen: soft, nontender, + bowel sounds Extremities: no edema, no joint deformities Neurological: no weakness  Dejah Droessler Ihor Gully, NP

## 2019-10-19 ENCOUNTER — Inpatient Hospital Stay
Admission: EM | Admit: 2019-10-19 | Discharge: 2019-10-25 | DRG: 388 | Disposition: A | Discharge status: Skilled Nursing Facility | Payer: Medicare Other | Attending: Internal Medicine | Admitting: Internal Medicine

## 2019-10-19 ENCOUNTER — Other Ambulatory Visit: Payer: Self-pay

## 2019-10-19 ENCOUNTER — Emergency Department: Payer: Medicare Other

## 2019-10-19 ENCOUNTER — Encounter: Payer: Self-pay | Admitting: Emergency Medicine

## 2019-10-19 DIAGNOSIS — M19042 Primary osteoarthritis, left hand: Secondary | ICD-10-CM | POA: Diagnosis present

## 2019-10-19 DIAGNOSIS — K838 Other specified diseases of biliary tract: Secondary | ICD-10-CM | POA: Diagnosis present

## 2019-10-19 DIAGNOSIS — F0280 Dementia in other diseases classified elsewhere without behavioral disturbance: Secondary | ICD-10-CM | POA: Diagnosis present

## 2019-10-19 DIAGNOSIS — Z8551 Personal history of malignant neoplasm of bladder: Secondary | ICD-10-CM

## 2019-10-19 DIAGNOSIS — Z20828 Contact with and (suspected) exposure to other viral communicable diseases: Secondary | ICD-10-CM | POA: Diagnosis present

## 2019-10-19 DIAGNOSIS — R1110 Vomiting, unspecified: Secondary | ICD-10-CM | POA: Diagnosis not present

## 2019-10-19 DIAGNOSIS — Z888 Allergy status to other drugs, medicaments and biological substances status: Secondary | ICD-10-CM

## 2019-10-19 DIAGNOSIS — Z8673 Personal history of transient ischemic attack (TIA), and cerebral infarction without residual deficits: Secondary | ICD-10-CM

## 2019-10-19 DIAGNOSIS — K219 Gastro-esophageal reflux disease without esophagitis: Secondary | ICD-10-CM | POA: Diagnosis present

## 2019-10-19 DIAGNOSIS — J90 Pleural effusion, not elsewhere classified: Secondary | ICD-10-CM | POA: Diagnosis present

## 2019-10-19 DIAGNOSIS — E876 Hypokalemia: Secondary | ICD-10-CM | POA: Diagnosis present

## 2019-10-19 DIAGNOSIS — C679 Malignant neoplasm of bladder, unspecified: Secondary | ICD-10-CM | POA: Diagnosis present

## 2019-10-19 DIAGNOSIS — Z66 Do not resuscitate: Secondary | ICD-10-CM | POA: Diagnosis present

## 2019-10-19 DIAGNOSIS — Z7982 Long term (current) use of aspirin: Secondary | ICD-10-CM

## 2019-10-19 DIAGNOSIS — Z79899 Other long term (current) drug therapy: Secondary | ICD-10-CM

## 2019-10-19 DIAGNOSIS — K5641 Fecal impaction: Principal | ICD-10-CM | POA: Diagnosis present

## 2019-10-19 DIAGNOSIS — F329 Major depressive disorder, single episode, unspecified: Secondary | ICD-10-CM | POA: Diagnosis present

## 2019-10-19 DIAGNOSIS — F0151 Vascular dementia with behavioral disturbance: Secondary | ICD-10-CM | POA: Diagnosis present

## 2019-10-19 DIAGNOSIS — Z90710 Acquired absence of both cervix and uterus: Secondary | ICD-10-CM

## 2019-10-19 DIAGNOSIS — G309 Alzheimer's disease, unspecified: Secondary | ICD-10-CM | POA: Diagnosis present

## 2019-10-19 DIAGNOSIS — R198 Other specified symptoms and signs involving the digestive system and abdomen: Secondary | ICD-10-CM

## 2019-10-19 DIAGNOSIS — N3289 Other specified disorders of bladder: Secondary | ICD-10-CM | POA: Diagnosis present

## 2019-10-19 DIAGNOSIS — Z9049 Acquired absence of other specified parts of digestive tract: Secondary | ICD-10-CM

## 2019-10-19 DIAGNOSIS — R112 Nausea with vomiting, unspecified: Secondary | ICD-10-CM

## 2019-10-19 DIAGNOSIS — T17908A Unspecified foreign body in respiratory tract, part unspecified causing other injury, initial encounter: Secondary | ICD-10-CM

## 2019-10-19 DIAGNOSIS — N1330 Unspecified hydronephrosis: Secondary | ICD-10-CM | POA: Diagnosis present

## 2019-10-19 DIAGNOSIS — I10 Essential (primary) hypertension: Secondary | ICD-10-CM | POA: Diagnosis present

## 2019-10-19 DIAGNOSIS — E785 Hyperlipidemia, unspecified: Secondary | ICD-10-CM | POA: Diagnosis present

## 2019-10-19 DIAGNOSIS — D649 Anemia, unspecified: Secondary | ICD-10-CM | POA: Diagnosis present

## 2019-10-19 DIAGNOSIS — J690 Pneumonitis due to inhalation of food and vomit: Secondary | ICD-10-CM | POA: Diagnosis present

## 2019-10-19 DIAGNOSIS — M19041 Primary osteoarthritis, right hand: Secondary | ICD-10-CM | POA: Diagnosis present

## 2019-10-19 DIAGNOSIS — F0150 Vascular dementia without behavioral disturbance: Secondary | ICD-10-CM | POA: Diagnosis present

## 2019-10-19 DIAGNOSIS — R778 Other specified abnormalities of plasma proteins: Secondary | ICD-10-CM | POA: Diagnosis present

## 2019-10-19 LAB — URINALYSIS, COMPLETE (UACMP) WITH MICROSCOPIC
Bacteria, UA: NONE SEEN
Bilirubin Urine: NEGATIVE
Glucose, UA: 150 mg/dL — AB
Hgb urine dipstick: NEGATIVE
Ketones, ur: 20 mg/dL — AB
Leukocytes,Ua: NEGATIVE
Nitrite: NEGATIVE
Protein, ur: NEGATIVE mg/dL
Specific Gravity, Urine: 1.018 (ref 1.005–1.030)
Squamous Epithelial / HPF: NONE SEEN (ref 0–5)
pH: 7 (ref 5.0–8.0)

## 2019-10-19 LAB — CBC
HCT: 33.4 % — ABNORMAL LOW (ref 36.0–46.0)
Hemoglobin: 11.2 g/dL — ABNORMAL LOW (ref 12.0–15.0)
MCH: 30.6 pg (ref 26.0–34.0)
MCHC: 33.5 g/dL (ref 30.0–36.0)
MCV: 91.3 fL (ref 80.0–100.0)
Platelets: 459 10*3/uL — ABNORMAL HIGH (ref 150–400)
RBC: 3.66 MIL/uL — ABNORMAL LOW (ref 3.87–5.11)
RDW: 13 % (ref 11.5–15.5)
WBC: 9.3 10*3/uL (ref 4.0–10.5)
nRBC: 0 % (ref 0.0–0.2)

## 2019-10-19 LAB — MAGNESIUM: Magnesium: 1.8 mg/dL (ref 1.7–2.4)

## 2019-10-19 LAB — COMPREHENSIVE METABOLIC PANEL
ALT: 13 U/L (ref 0–44)
AST: 23 U/L (ref 15–41)
Albumin: 3.6 g/dL (ref 3.5–5.0)
Alkaline Phosphatase: 64 U/L (ref 38–126)
Anion gap: 13 (ref 5–15)
BUN: 11 mg/dL (ref 8–23)
CO2: 22 mmol/L (ref 22–32)
Calcium: 8.7 mg/dL — ABNORMAL LOW (ref 8.9–10.3)
Chloride: 104 mmol/L (ref 98–111)
Creatinine, Ser: 0.59 mg/dL (ref 0.44–1.00)
GFR calc Af Amer: >60 mL/min (ref >60)
GFR calc non Af Amer: >60 mL/min (ref >60)
Glucose, Bld: 155 mg/dL — ABNORMAL HIGH (ref 70–99)
Potassium: 3 mmol/L — ABNORMAL LOW (ref 3.5–5.1)
Sodium: 139 mmol/L (ref 135–145)
Total Bilirubin: 0.7 mg/dL (ref 0.3–1.2)
Total Protein: 7.1 g/dL (ref 6.5–8.1)

## 2019-10-19 LAB — LIPASE, BLOOD: Lipase: 24 U/L (ref 11–51)

## 2019-10-19 LAB — TROPONIN I (HIGH SENSITIVITY): Troponin I (High Sensitivity): 15 ng/L (ref <18)

## 2019-10-19 MED ORDER — ONDANSETRON HCL 4 MG/2ML IJ SOLN
4.0000 mg | Freq: Once | INTRAMUSCULAR | Status: AC | PRN
  Administered 2019-10-19: 4 mg via INTRAVENOUS
  Filled 2019-10-19: qty 2

## 2019-10-19 MED ORDER — IOHEXOL 300 MG/ML  SOLN
75.0000 mL | Freq: Once | INTRAMUSCULAR | Status: AC | PRN
  Administered 2019-10-19: 75 mL via INTRAVENOUS

## 2019-10-19 MED ORDER — ONDANSETRON HCL 4 MG PO TABS: 4.0000 mg | ORAL_TABLET | Freq: Every day | ORAL | 0 refills | Status: DC | PRN

## 2019-10-19 MED ORDER — POLYETHYLENE GLYCOL 3350 17 G PO PACK: 17.0000 g | PACK | Freq: Every day | ORAL | 0 refills | Status: DC

## 2019-10-19 MED ORDER — SODIUM CHLORIDE 0.9 % IV SOLN
Freq: Once | INTRAVENOUS | Status: AC
  Administered 2019-10-19: 16:00:00 via INTRAVENOUS

## 2019-10-19 MED ORDER — POTASSIUM CHLORIDE 10 MEQ/100ML IV SOLN
10.0000 meq | INTRAVENOUS | Status: AC
  Administered 2019-10-19 (×2): 10 meq via INTRAVENOUS
  Filled 2019-10-19 (×2): qty 100

## 2019-10-19 MED ORDER — SODIUM CHLORIDE 0.9% FLUSH: 3.0000 mL | Freq: Once | INTRAVENOUS | Status: DC

## 2019-10-19 MED ORDER — ONDANSETRON HCL 4 MG/2ML IJ SOLN
4.0000 mg | Freq: Once | INTRAMUSCULAR | Status: AC
  Administered 2019-10-19: 4 mg via INTRAVENOUS
  Filled 2019-10-19: qty 2

## 2019-10-19 NOTE — ED Triage Notes (Signed)
Arrives with daughter for c/o vomiting.  Patient lives at [facility] assisted living.  Per daughter, emesis started this morning at around 0930.  Initially vomiting dark green and now yellow bilious emesis.

## 2019-10-19 NOTE — ED Notes (Signed)
White splint noted on patient's right wrist. Per family, patient injured her wrist last week.

## 2019-10-19 NOTE — ED Notes (Signed)
Pt cleaned, dried, and repositioned at this time by this RN and Health visitor.

## 2019-10-19 NOTE — ED Notes (Signed)
Pt had small multiple pieces of formed stool.

## 2019-10-19 NOTE — ED Notes (Signed)
Bladder scan detected approx 430 mL of urine residual in bladder post void.

## 2019-10-19 NOTE — ED Triage Notes (Signed)
First RN Note: Pt presents to ED via POV with c/o vomiting. Pt actively vomiting upon arrival to ED.

## 2019-10-19 NOTE — ED Notes (Signed)
Dr Jari Pigg performed digital disimpaction. Pt tolerated procedure well. Moderate quantity of formed brown stool removed. Hygiene and peri care performed. Pt placed in clean brief and purewick replaced with new. Gown and bed linens changed. Pt returned to comfortable position and resting comfortably.

## 2019-10-19 NOTE — ED Provider Notes (Addendum)
[facility] Emergency Department Provider Note  ____________________________________________   First MD Initiated Contact with Patient 10/19/19 1654     (approximate)  I have reviewed the triage vital signs and the nursing notes.   HISTORY  Chief Complaint Emesis    HPI Gabrielle Crosby is a 85 y.o. female with prior brain bleed, bladder cancer, hypertension hyperlipidemia who presents with vomiting.  [facility] resides at [facility].  The emesis started at 9:30 AM.  It has been intermittent, occasional green bilious, nothing makes it better, nothing makes it worse.  Associate with some abdominal pain.  Has had a history of prior abdominal resection after what sounds like an obstruction.          Past Medical History:  Diagnosis Date  . Arthritis    hands  . Bladder cancer ([place])   . Brain bleed ([place]) 2019   d/t light strokes, high bp, seizures. brain has shrunk per dr. Manuella Ghazi and mri  . Cancer ([place])    BLADDER X 2  . Dementia ([place]) 2019   dementia progressing  . Depression   . GERD (gastroesophageal reflux disease)    chokes easily even on own saliva and then has panic attack  . Hyperlipidemia   . Hypertension 2019   no medications.  no htn per daughter  . Seizures [facility])    family not aware of any seizures and no treatment  . Stroke [facility]) 2019   undiagnosed but per neuro, she has had them causing vascular dementia  . Wears dentures    partial upper and lower    Patient Active Problem List   Diagnosis Date Noted  . HLD (hyperlipidemia) 07/25/2019  . GERD (gastroesophageal reflux disease) 07/25/2019  . Facial droop 07/25/2019  . Loss of memory 07/26/2016  . Anxiety 06/26/2016  . Bradycardia 01/02/2015  . H/O deep venous thrombosis 01/02/2015  . MI (mitral incompetence) 01/02/2015  . Near syncope 01/02/2015  . Malignant neoplasm of urinary bladder ([place]) 08/22/2014  . Mixed Alzheimer's and vascular dementia ([place])  08/22/2014  . Essential (primary) hypertension 08/22/2014  . Adaptive colitis 08/22/2014  . Lacunar infarction ([place]) 08/22/2014  . Combined fat and carbohydrate induced hyperlipemia 08/22/2014  . Allergic rhinitis, seasonal 08/22/2014    Past Surgical History:  Procedure Laterality Date  . ABDOMINAL HYSTERECTOMY Bilateral    BILATERAL s&o  . BACK SURGERY    . BLADDER SURGERY N/A 2015   REMOVAL OF BLADDER CANCER X 2  . BOWEL RESECTION N/A 1999   SMALL BOWEL OBSTRUCTION  . CHOLECYSTECTOMY    . COLONOSCOPY N/A 11/06/2015   Procedure: COLONOSCOPY;  Surgeon: Hulen Luster, MD;  Location: [place];  Service: Gastroenterology;  Laterality: N/A;  . CYSTOSCOPY WITH BIOPSY N/A 04/17/2015   Procedure: CYSTOSCOPY WITH BIOPSY;  Surgeon: Hollice Espy, MD;  Location: [facility];  Service: Urology;  Laterality: N/A;  . KYPHOPLASTY N/A 12/20/2017   Procedure: [id number];  Surgeon: Hessie Knows, MD;  Location: [facility];  Service: Orthopedics;  Laterality: N/A;  . KYPHOPLASTY N/A 04/21/2018   Procedure: KYPHOPLASTY-L2,L3,L4,L5;  Surgeon: Hessie Knows, MD;  Location: [facility];  Service: Orthopedics;  Laterality: N/A;  . SACROPLASTY N/A 11/03/2018   Procedure: Dillard Cannon;  Surgeon: Hessie Knows, MD;  Location: [facility];  Service: Orthopedics;  Laterality: N/A;    Prior to Admission medications   Medication Sig Start Date End Date Taking? Authorizing Provider  acetaminophen (TYLENOL) 500 MG tablet Take 500 mg by mouth  2 (two) times daily.     [provider]  aspirin EC 325 MG EC tablet Take 1 tablet (325 mg total) by mouth daily. 07/29/19   Nicholes Mango, MD  atorvastatin (LIPITOR) 40 MG tablet Take 1 tablet (40 mg total) by mouth daily at 6 PM. 07/28/19   Gouru, Aruna, MD  Cholecalciferol (VITAMIN D3) 50 MCG (2000 UT) capsule Take 2,000 Units by mouth daily.  05/10/19   [provider]  citalopram (CELEXA) 20 MG tablet Take 20 mg by mouth daily.     [provider]   docusate sodium (COLACE) 50 MG capsule Take 50 mg by mouth 2 (two) times daily as needed for mild constipation.    [provider]  lisinopril (ZESTRIL) 5 MG tablet Take 1 tablet (5 mg total) by mouth daily. 07/29/19   Nicholes Mango, MD  Magnesium 100 MG CAPS Take 100 mg by mouth daily.     [provider]  memantine (NAMENDA) 10 MG tablet Take 10 mg by mouth 2 (two) times daily.  11/05/16   [provider]  QUEtiapine (SEROQUEL) 25 MG tablet Take 50 mg by mouth at bedtime.  05/10/19 05/09/20  [provider]  sulfamethoxazole-trimethoprim (BACTRIM DS) 800-160 MG tablet Take 1 tablet by mouth every 12 (twelve) hours. 07/28/19   Nicholes Mango, MD  vitamin B-12 (CYANOCOBALAMIN) 1000 MCG tablet Take 1,000 mcg by mouth daily.    [provider]    Allergies Atenolol and Donepezil hcl  Family History  Family history unknown: Yes    Social History Social History   Tobacco Use  . Smoking status: Never Smoker  . Smokeless tobacco: Never Used  Substance Use Topics  . Alcohol use: Never    Alcohol/week: 0.0 standard drinks    Frequency: Never  . Drug use: No      Review of Systems Constitutional: No fever/chills Eyes: No visual changes. ENT: No sore throat. Cardiovascular: Denies chest pain. Respiratory: Denies shortness of breath. Gastrointestinal: Positive abdominal pain and vomiting Genitourinary: Negative for dysuria. Musculoskeletal: Negative for back pain. Skin: Negative for rash. Neurological: Negative for headaches, focal weakness or numbness. All other ROS negative ____________________________________________   PHYSICAL EXAM:  VITAL SIGNS: ED Triage Vitals  Enc Vitals Group     BP 10/19/19 1523 (!) 165/69     Pulse Rate 10/19/19 1523 72     Resp 10/19/19 1523 16     Temp 10/19/19 1523 97.6 F (36.4 C)     Temp Source 10/19/19 1523 Oral     SpO2 10/19/19 1523 96 %     Weight 10/19/19 1520 101 lb 6.6 oz (46 kg)     Height  10/19/19 1520 5' (1.524 m)     Head Circumference --      Peak Flow --      Pain Score 10/19/19 1520 0     Pain Loc --      Pain Edu? --      Excl. in [place]? --     Constitutional: Alert. Elderly female Eyes: Conjunctivae are normal. EOMI. Head: Atraumatic. Nose: No congestion/rhinnorhea. Mouth/Throat: Mucous membranes are moist.   Neck: No stridor. Trachea Midline. FROM Cardiovascular: Normal rate, regular rhythm. Grossly normal heart sounds.  Good peripheral circulation. Respiratory: Normal respiratory effort.  No retractions. Lungs CTAB. Gastrointestinal: Soft not distended but does appear to be tender. Musculoskeletal: No lower extremity tenderness nor edema.  No joint effusions. Neurologic: Baseline dementia but no gross focal neurologic deficits are appreciated.  Skin:  Skin is warm, dry and intact. No rash noted. Psychiatric: Unable to fully assess due to baseline dementia. GU: Deferred   ____________________________________________   LABS (all labs ordered are listed, but only abnormal results are displayed)  Labs Reviewed  COMPREHENSIVE METABOLIC PANEL - Abnormal; Notable for the following components:      Result Value   Potassium 3.0 (*)    Glucose, Bld 155 (*)    Calcium 8.7 (*)    All other components within normal limits  CBC - Abnormal; Notable for the following components:   RBC 3.66 (*)    Hemoglobin 11.2 (*)    HCT 33.4 (*)    Platelets 459 (*)    All other components within normal limits  URINALYSIS, COMPLETE (UACMP) WITH MICROSCOPIC - Abnormal; Notable for the following components:   Color, Urine STRAW (*)    APPearance CLEAR (*)    Glucose, UA 150 (*)    Ketones, ur 20 (*)    All other components within normal limits  LIPASE, BLOOD  MAGNESIUM  TROPONIN I (HIGH SENSITIVITY)  TROPONIN I (HIGH SENSITIVITY)   ____________________________________________   ED ECG REPORT I, Vanessa Medley, the attending physician, personally viewed and interpreted  this ECG.  EKG is normal sinus rate of 81, no ST elevation, no T wave inversion, normal intervals ____________________________________________  RADIOLOGY   Official radiology report(s): Ct Head Wo Contrast  Result Date: 10/19/2019 CLINICAL DATA:  Unexplained altered level of consciousness. Vomiting. EXAM: CT HEAD WITHOUT CONTRAST TECHNIQUE: Contiguous axial images were obtained from the base of the skull through the vertex without intravenous contrast. COMPARISON:  MRI 07/26/2019 FINDINGS: Brain: No sign of acute infarction by CT. No brainstem or cerebellar abnormality is seen. Old left occipital infarction. Old right frontal infarction which was acute in August of this year. Small old left frontal cortical infarction. Chronic small-vessel ischemic changes of the white matter as seen previously. No mass lesion, hemorrhage, hydrocephalus or extra-axial collection. Vascular: There is atherosclerotic calcification of the major vessels at the base of the brain. Skull: Negative Sinuses/Orbits: Clear/normal Other: None IMPRESSION: No acute finding by CT. Atrophy and chronic small-vessel ischemic changes. 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 Small old left frontal infarction as well. Electronically Signed   By: Nelson Chimes M.D.   On: 10/19/2019 18:02   Ct Abdomen Pelvis W Contrast  Result Date: 10/19/2019 CLINICAL DATA:  Abdomen distension with vomiting EXAM: CT ABDOMEN AND PELVIS WITH CONTRAST TECHNIQUE: Multidetector CT imaging of the abdomen and pelvis was performed using the standard protocol following bolus administration of intravenous contrast. CONTRAST:  29mL OMNIPAQUE IOHEXOL 300 MG/ML  SOLN COMPARISON:  CT lumbar spine 10/15/2018 FINDINGS: Lower chest: Small right greater than left pleural effusions at the lung bases. Partial consolidation and adjacent ground-glass density in the right greater than left lower lobes. Heart size within normal limits. Coronary vascular calcification.  Moderate hiatal hernia. Hepatobiliary: Status post cholecystectomy. Mild intra hepatic and moderate extrahepatic biliary dilatation with common bile duct measuring up to 15 mm. Pancreas: Unremarkable. No pancreatic ductal dilatation or surrounding inflammatory changes. Spleen: Normal in size without focal abnormality. Adrenals/Urinary Tract: Adrenal glands are normal. Cysts in the right kidney. Mild bilateral hydronephrosis and prominence of the ureters. Very distended urinary bladder Stomach/Bowel: Stomach is within normal limits. Large amount of stool in the colon and rectum. Sigmoid colon diverticular disease without acute inflammatory change no evidence of bowel wall thickening, distention, or inflammatory changes. Vascular/Lymphatic: Moderate aortic atherosclerosis without  aneurysm. No significantly enlarged lymph nodes Reproductive: Status post hysterectomy. No adnexal masses. Other: Negative for free air or free fluid. Musculoskeletal: No acute osseous abnormality. Prior bilateral sacral plasty. Vertebral augmentation L1 through L5 with chronic appearing fracture deformities. No definite acute osseous abnormality IMPRESSION: 1. No CT evidence for acute intra-abdominal or pelvic abnormality. 2. Small right greater than left pleural effusions with partial consolidations and adjacent ground-glass density in the lower lobes either reflecting atelectasis or pneumonia 3. Status post cholecystectomy. Intra and extrahepatic biliary dilatation with common duct measuring up to 15 mm. Recommend correlation with LFTs with follow-up MRCP if clinically indicated 4. Slight hydronephrosis bilaterally, which may be secondary to urinary bladder distention 5. Sigmoid colon diverticular disease without acute inflammatory process. Large volume of stool in the colon and rectum Electronically Signed   By: Donavan Foil M.D.   On: 10/19/2019 18:11    ____________________________________________   PROCEDURES  Procedure(s)  performed (including Critical Care):  Procedures  ------------------------------------------------------------------------------------------------------------------- Fecal Disimpaction Procedure Note:  Performed by me:  Patient placed in the lateral recumbent position with knees drawn towards chest. Nurse present for patient support. Large amount of hard brown stool removed. No complications during procedure.   ------------------------------------------------------------------------------------------------------------------   ____________________________________________   INITIAL IMPRESSION / ASSESSMENT AND PLAN / ED COURSE  Gabrielle Crosby was evaluated in Emergency Department on 10/19/2019 for the symptoms described in the history of present illness. She was evaluated in the context of the global COVID-19 pandemic, which necessitated consideration that the patient might be at risk for infection with the SARS-CoV-2 virus that causes COVID-19. Institutional protocols and algorithms that pertain to the evaluation of patients at risk for COVID-19 are in a state of rapid change based on information released by regulatory bodies including the CDC and federal and state organizations. These policies and algorithms were followed during the patient's care in the ED.    Patient is an 85 year old who presents with vomiting.  Will get labs to evaluate for electrolyte abnormalities, AKI.  Will get CT head to evaluate for intracranial hemorrhage.  Will get CT abdomen to evaluate for obstruction.  UA without evidence of UTI  Potassium slightly low at 3.0.  Will give 20 of IV K.  Hemoglobin is slightly lower than baseline at 11.2.  Troponin is 15 therefore unlikely secondary to ACS  CT head is negative  CT abdomen question possible pneumonia versus atelectasis.  There is also question of dilation of her common bile duct at 15 mm.  But she has no LFT elevation therefore I think it is unlikely  secondary to retained stone.  No right upper quadrant tenderness  Slight hydronephrosis most likely secondary to urinary bladder distention.  We will do a post void bladder scan.  Patient was slightly retaining we discussed catheter but did not want to proceed with that given that they think she would just pull it out.  Given her kidney function is normal and that is reasonable to hold off.  There is a large volume of stool in the rectum.  He did rectal disimpaction.  Patient had no longer vomiting since being here.  Will discharge patient back to facility at this time given negative CT scan, reassuring abdominal exam and no continued vomiting.  On attempt at discharging patient she started to have bilious vomiting again.  Given patient unable to tolerate p.o. due to bilious vomiting will admit for bowel regimen and observation.    ____________________________________________   FINAL CLINICAL IMPRESSION(S) / ED  DIAGNOSES   Final diagnoses:  Nausea and vomiting, intractability of vomiting not specified, unspecified vomiting type  Fecal impaction ([place])      MEDICATIONS GIVEN DURING THIS VISIT:  Medications  sodium chloride flush (NS) 0.9 % injection 3 mL (3 mLs Intravenous Not Given 10/19/19 1702)  ondansetron (ZOFRAN) injection 4 mg (4 mg Intravenous Given 10/19/19 1533)  0.9 %  sodium chloride infusion ( Intravenous Stopped 10/19/19 1807)  ondansetron (ZOFRAN) injection 4 mg (4 mg Intravenous Given 10/19/19 1741)  potassium chloride 10 mEq in 100 mL IVPB (0 mEq Intravenous Stopped 10/19/19 2147)  iohexol (OMNIPAQUE) 300 MG/ML solution 75 mL (75 mLs Intravenous Contrast Given 10/19/19 1743)     ED Discharge Orders         Ordered    polyethylene glycol (MIRALAX) 17 g packet  Daily     10/19/19 2252    ondansetron (ZOFRAN) 4 MG tablet  Daily PRN     10/19/19 2252           Note:  This document was prepared using Dragon voice recognition software and may include  unintentional dictation errors.   Vanessa Hackberry, MD 10/19/19 2252    Vanessa Guy, MD 10/20/19 0040

## 2019-10-19 NOTE — ED Notes (Signed)
Patient transported to CT 

## 2019-10-20 ENCOUNTER — Inpatient Hospital Stay: Payer: Medicare Other

## 2019-10-20 DIAGNOSIS — Z90710 Acquired absence of both cervix and uterus: Secondary | ICD-10-CM | POA: Diagnosis not present

## 2019-10-20 DIAGNOSIS — E876 Hypokalemia: Secondary | ICD-10-CM | POA: Diagnosis present

## 2019-10-20 DIAGNOSIS — J90 Pleural effusion, not elsewhere classified: Secondary | ICD-10-CM | POA: Diagnosis present

## 2019-10-20 DIAGNOSIS — J690 Pneumonitis due to inhalation of food and vomit: Secondary | ICD-10-CM | POA: Diagnosis not present

## 2019-10-20 DIAGNOSIS — F0151 Vascular dementia with behavioral disturbance: Secondary | ICD-10-CM | POA: Diagnosis present

## 2019-10-20 DIAGNOSIS — D649 Anemia, unspecified: Secondary | ICD-10-CM | POA: Diagnosis present

## 2019-10-20 DIAGNOSIS — Z888 Allergy status to other drugs, medicaments and biological substances status: Secondary | ICD-10-CM | POA: Diagnosis not present

## 2019-10-20 DIAGNOSIS — I10 Essential (primary) hypertension: Secondary | ICD-10-CM

## 2019-10-20 DIAGNOSIS — Z66 Do not resuscitate: Secondary | ICD-10-CM | POA: Diagnosis present

## 2019-10-20 DIAGNOSIS — K5909 Other constipation: Secondary | ICD-10-CM | POA: Diagnosis not present

## 2019-10-20 DIAGNOSIS — F329 Major depressive disorder, single episode, unspecified: Secondary | ICD-10-CM | POA: Diagnosis present

## 2019-10-20 DIAGNOSIS — N3289 Other specified disorders of bladder: Secondary | ICD-10-CM | POA: Diagnosis present

## 2019-10-20 DIAGNOSIS — Z8551 Personal history of malignant neoplasm of bladder: Secondary | ICD-10-CM | POA: Diagnosis not present

## 2019-10-20 DIAGNOSIS — R112 Nausea with vomiting, unspecified: Secondary | ICD-10-CM | POA: Diagnosis present

## 2019-10-20 DIAGNOSIS — M19042 Primary osteoarthritis, left hand: Secondary | ICD-10-CM | POA: Diagnosis present

## 2019-10-20 DIAGNOSIS — R778 Other specified abnormalities of plasma proteins: Secondary | ICD-10-CM

## 2019-10-20 DIAGNOSIS — R198 Other specified symptoms and signs involving the digestive system and abdomen: Secondary | ICD-10-CM | POA: Diagnosis not present

## 2019-10-20 DIAGNOSIS — Z20828 Contact with and (suspected) exposure to other viral communicable diseases: Secondary | ICD-10-CM | POA: Diagnosis present

## 2019-10-20 DIAGNOSIS — G309 Alzheimer's disease, unspecified: Secondary | ICD-10-CM | POA: Diagnosis present

## 2019-10-20 DIAGNOSIS — Z8673 Personal history of transient ischemic attack (TIA), and cerebral infarction without residual deficits: Secondary | ICD-10-CM | POA: Diagnosis not present

## 2019-10-20 DIAGNOSIS — Z9049 Acquired absence of other specified parts of digestive tract: Secondary | ICD-10-CM | POA: Diagnosis not present

## 2019-10-20 DIAGNOSIS — E785 Hyperlipidemia, unspecified: Secondary | ICD-10-CM | POA: Diagnosis present

## 2019-10-20 DIAGNOSIS — K5641 Fecal impaction: Secondary | ICD-10-CM | POA: Diagnosis not present

## 2019-10-20 DIAGNOSIS — M19041 Primary osteoarthritis, right hand: Secondary | ICD-10-CM | POA: Diagnosis present

## 2019-10-20 DIAGNOSIS — K219 Gastro-esophageal reflux disease without esophagitis: Secondary | ICD-10-CM

## 2019-10-20 DIAGNOSIS — N1330 Unspecified hydronephrosis: Secondary | ICD-10-CM | POA: Diagnosis not present

## 2019-10-20 DIAGNOSIS — K838 Other specified diseases of biliary tract: Secondary | ICD-10-CM | POA: Diagnosis not present

## 2019-10-20 DIAGNOSIS — R1110 Vomiting, unspecified: Secondary | ICD-10-CM | POA: Diagnosis present

## 2019-10-20 LAB — TROPONIN I (HIGH SENSITIVITY)
Troponin I (High Sensitivity): 24 ng/L — ABNORMAL HIGH (ref <18)
Troponin I (High Sensitivity): 71 ng/L — ABNORMAL HIGH (ref <18)
Troponin I (High Sensitivity): 80 ng/L — ABNORMAL HIGH (ref <18)

## 2019-10-20 LAB — BASIC METABOLIC PANEL
Anion gap: 12 (ref 5–15)
BUN: 9 mg/dL (ref 8–23)
CO2: 22 mmol/L (ref 22–32)
Calcium: 8.6 mg/dL — ABNORMAL LOW (ref 8.9–10.3)
Chloride: 104 mmol/L (ref 98–111)
Creatinine, Ser: 0.52 mg/dL (ref 0.44–1.00)
GFR calc Af Amer: >60 mL/min (ref >60)
GFR calc non Af Amer: >60 mL/min (ref >60)
Glucose, Bld: 165 mg/dL — ABNORMAL HIGH (ref 70–99)
Potassium: 3.3 mmol/L — ABNORMAL LOW (ref 3.5–5.1)
Sodium: 138 mmol/L (ref 135–145)

## 2019-10-20 LAB — SARS CORONAVIRUS 2 (TAT 6-24 HRS): SARS Coronavirus 2: NEGATIVE

## 2019-10-20 LAB — CBC
HCT: 34.5 % — ABNORMAL LOW (ref 36.0–46.0)
Hemoglobin: 11.5 g/dL — ABNORMAL LOW (ref 12.0–15.0)
MCH: 30.9 pg (ref 26.0–34.0)
MCHC: 33.3 g/dL (ref 30.0–36.0)
MCV: 92.7 fL (ref 80.0–100.0)
Platelets: 506 10*3/uL — ABNORMAL HIGH (ref 150–400)
RBC: 3.72 MIL/uL — ABNORMAL LOW (ref 3.87–5.11)
RDW: 12.9 % (ref 11.5–15.5)
WBC: 17.7 10*3/uL — ABNORMAL HIGH (ref 4.0–10.5)
nRBC: 0 % (ref 0.0–0.2)

## 2019-10-20 MED ORDER — CITALOPRAM HYDROBROMIDE 20 MG PO TABS
20.0000 mg | ORAL_TABLET | Freq: Every day | ORAL | Status: DC
  Administered 2019-10-20 – 2019-10-25 (×6): 20 mg via ORAL
  Filled 2019-10-20 (×6): qty 1

## 2019-10-20 MED ORDER — QUETIAPINE FUMARATE 25 MG PO TABS
25.0000 mg | ORAL_TABLET | Freq: Every morning | ORAL | Status: DC
  Administered 2019-10-21 – 2019-10-25 (×5): 25 mg via ORAL
  Filled 2019-10-20 (×7): qty 1

## 2019-10-20 MED ORDER — ONDANSETRON HCL 4 MG PO TABS: 4.0000 mg | ORAL_TABLET | Freq: Four times a day (QID) | ORAL | Status: DC | PRN

## 2019-10-20 MED ORDER — VITAMIN B-12 1000 MCG PO TABS
1000.0000 ug | ORAL_TABLET | Freq: Every day | ORAL | Status: DC
  Administered 2019-10-20 – 2019-10-25 (×6): 1000 ug via ORAL
  Filled 2019-10-20 (×6): qty 1

## 2019-10-20 MED ORDER — SODIUM CHLORIDE 0.9 % IV SOLN
3.0000 g | Freq: Four times a day (QID) | INTRAVENOUS | Status: DC
  Administered 2019-10-20 – 2019-10-21 (×5): 3 g via INTRAVENOUS
  Filled 2019-10-20: qty 8
  Filled 2019-10-20: qty 3
  Filled 2019-10-20 (×3): qty 8
  Filled 2019-10-20 (×2): qty 3

## 2019-10-20 MED ORDER — HYDRALAZINE HCL 20 MG/ML IJ SOLN
5.0000 mg | INTRAMUSCULAR | Status: DC | PRN
  Administered 2019-10-20 – 2019-10-23 (×2): 5 mg via INTRAVENOUS
  Filled 2019-10-20 (×2): qty 1

## 2019-10-20 MED ORDER — ONDANSETRON HCL 4 MG/2ML IJ SOLN: 4.0000 mg | Freq: Four times a day (QID) | INTRAMUSCULAR | Status: DC | PRN

## 2019-10-20 MED ORDER — TRAZODONE HCL 50 MG PO TABS: 25.0000 mg | ORAL_TABLET | Freq: Every evening | ORAL | Status: DC | PRN

## 2019-10-20 MED ORDER — VITAMIN D 25 MCG (1000 UNIT) PO TABS
2000.0000 [IU] | ORAL_TABLET | Freq: Every day | ORAL | Status: DC
  Administered 2019-10-20 – 2019-10-25 (×6): 2000 [IU] via ORAL
  Filled 2019-10-20 (×6): qty 2

## 2019-10-20 MED ORDER — MAGNESIUM OXIDE 400 (241.3 MG) MG PO TABS
200.0000 mg | ORAL_TABLET | Freq: Every day | ORAL | Status: DC
  Administered 2019-10-20 – 2019-10-25 (×6): 200 mg via ORAL
  Filled 2019-10-20 (×5): qty 1

## 2019-10-20 MED ORDER — HEPARIN SODIUM (PORCINE) 5000 UNIT/ML IJ SOLN
5000.0000 [IU] | Freq: Three times a day (TID) | INTRAMUSCULAR | Status: DC
  Administered 2019-10-20 – 2019-10-25 (×16): 5000 [IU] via SUBCUTANEOUS
  Filled 2019-10-20 (×16): qty 1

## 2019-10-20 MED ORDER — CHLORHEXIDINE GLUCONATE CLOTH 2 % EX PADS
6.0000 | MEDICATED_PAD | Freq: Every day | CUTANEOUS | Status: DC
  Administered 2019-10-20 – 2019-10-24 (×5): 6 via TOPICAL

## 2019-10-20 MED ORDER — MEMANTINE HCL 5 MG PO TABS
10.0000 mg | ORAL_TABLET | Freq: Two times a day (BID) | ORAL | Status: DC
  Administered 2019-10-20 – 2019-10-25 (×10): 10 mg via ORAL
  Filled 2019-10-20: qty 1
  Filled 2019-10-20 (×2): qty 2
  Filled 2019-10-20: qty 1
  Filled 2019-10-20 (×7): qty 2

## 2019-10-20 MED ORDER — LORAZEPAM 2 MG/ML IJ SOLN: 1.0000 mg | INTRAMUSCULAR | Status: DC | PRN

## 2019-10-20 MED ORDER — PRAVASTATIN SODIUM 20 MG PO TABS
20.0000 mg | ORAL_TABLET | Freq: Every day | ORAL | Status: DC
  Administered 2019-10-21 – 2019-10-24 (×4): 20 mg via ORAL
  Filled 2019-10-20 (×5): qty 1

## 2019-10-20 MED ORDER — QUETIAPINE FUMARATE 25 MG PO TABS
50.0000 mg | ORAL_TABLET | Freq: Every day | ORAL | Status: DC
  Administered 2019-10-21 – 2019-10-24 (×4): 50 mg via ORAL
  Filled 2019-10-20 (×4): qty 2

## 2019-10-20 MED ORDER — POTASSIUM CHLORIDE IN NACL 40-0.9 MEQ/L-% IV SOLN
INTRAVENOUS | Status: DC
  Administered 2019-10-20 (×2): 100 mL/h via INTRAVENOUS
  Filled 2019-10-20 (×6): qty 1000

## 2019-10-20 MED ORDER — ACETAMINOPHEN 325 MG PO TABS
650.0000 mg | ORAL_TABLET | Freq: Four times a day (QID) | ORAL | Status: DC | PRN
  Administered 2019-10-22: 650 mg via ORAL
  Filled 2019-10-20: qty 2

## 2019-10-20 MED ORDER — ACETAMINOPHEN 650 MG RE SUPP: 650.0000 mg | Freq: Four times a day (QID) | RECTAL | Status: DC | PRN

## 2019-10-20 MED ORDER — DOCUSATE SODIUM 100 MG PO CAPS: 100.0000 mg | ORAL_CAPSULE | Freq: Every day | ORAL | Status: DC | PRN

## 2019-10-20 MED ORDER — ASPIRIN EC 81 MG PO TBEC
81.0000 mg | DELAYED_RELEASE_TABLET | Freq: Every day | ORAL | Status: DC
  Filled 2019-10-20: qty 1

## 2019-10-20 MED ORDER — FLEET ENEMA 7-19 GM/118ML RE ENEM: 1.0000 | ENEMA | Freq: Once | RECTAL | Status: DC | PRN

## 2019-10-20 MED ORDER — TRAZODONE HCL 50 MG PO TABS
50.0000 mg | ORAL_TABLET | Freq: Every day | ORAL | Status: DC
  Administered 2019-10-21 – 2019-10-24 (×4): 50 mg via ORAL
  Filled 2019-10-20 (×4): qty 1

## 2019-10-20 MED ORDER — GADOBUTROL 1 MMOL/ML IV SOLN
4.0000 mL | Freq: Once | INTRAVENOUS | Status: AC | PRN
  Administered 2019-10-20: 4 mL via INTRAVENOUS
  Filled 2019-10-20: qty 4

## 2019-10-20 MED ORDER — MAGNESIUM HYDROXIDE 400 MG/5ML PO SUSP: 30.0000 mL | Freq: Every day | ORAL | Status: DC | PRN

## 2019-10-20 MED ORDER — LISINOPRIL 10 MG PO TABS
5.0000 mg | ORAL_TABLET | Freq: Every day | ORAL | Status: DC
  Administered 2019-10-20 – 2019-10-25 (×6): 5 mg via ORAL
  Filled 2019-10-20 (×6): qty 1

## 2019-10-20 MED ORDER — ENOXAPARIN SODIUM 40 MG/0.4ML ~~LOC~~ SOLN
40.0000 mg | SUBCUTANEOUS | Status: DC
  Administered 2019-10-20: 40 mg via SUBCUTANEOUS
  Filled 2019-10-20: qty 0.4

## 2019-10-20 MED ORDER — POTASSIUM CHLORIDE 20 MEQ PO PACK
40.0000 meq | PACK | Freq: Once | ORAL | Status: AC
  Administered 2019-10-20: 40 meq via ORAL

## 2019-10-20 MED ORDER — GUAIFENESIN ER 600 MG PO TB12
600.0000 mg | ORAL_TABLET | Freq: Two times a day (BID) | ORAL | Status: DC
  Administered 2019-10-20: 600 mg via ORAL
  Filled 2019-10-20 (×2): qty 1

## 2019-10-20 MED ORDER — BISACODYL 10 MG RE SUPP
10.0000 mg | Freq: Every day | RECTAL | Status: DC | PRN
  Filled 2019-10-20: qty 1

## 2019-10-20 MED ORDER — ATORVASTATIN CALCIUM 20 MG PO TABS: 40.0000 mg | ORAL_TABLET | Freq: Every day | ORAL | Status: DC

## 2019-10-20 MED ORDER — HYDRALAZINE HCL 20 MG/ML IJ SOLN
10.0000 mg | Freq: Once | INTRAMUSCULAR | Status: AC
  Administered 2019-10-20: 10 mg via INTRAVENOUS
  Filled 2019-10-20: qty 1

## 2019-10-20 MED ORDER — SODIUM CHLORIDE 0.9 % IV SOLN
500.0000 mg | INTRAVENOUS | Status: DC
  Administered 2019-10-20: 500 mg via INTRAVENOUS
  Filled 2019-10-20 (×4): qty 500

## 2019-10-20 MED ORDER — LORAZEPAM 2 MG/ML IJ SOLN
1.0000 mg | Freq: Once | INTRAMUSCULAR | Status: AC
  Administered 2019-10-20: 1 mg via INTRAVENOUS
  Filled 2019-10-20: qty 1

## 2019-10-20 MED ORDER — QUETIAPINE FUMARATE 25 MG PO TABS: 50.0000 mg | ORAL_TABLET | Freq: Two times a day (BID) | ORAL | Status: DC

## 2019-10-20 NOTE — ED Notes (Signed)
Called MRI and explained that Dr wanted to attempt MRI again after pt had some medication

## 2019-10-20 NOTE — ED Notes (Signed)
Pt transported to MRI 

## 2019-10-20 NOTE — Progress Notes (Signed)
PROGRESS NOTE    Gabrielle Crosby  [id number] DOB: 02/09/1933 DOA: 10/19/2019 PCP: Toni Arthurs, NP    Brief Narrative:   Gabrielle Crosby  is a 85 y.o. Caucasian female with a known history of multiple medical problems that will be mentioned below, presented to the emergency room with acute onset of bilious vomitus as well as constipation with stool impaction which have been going on over the last couple days.  She denied any fever or chills.  She has been having abdominal discomfort with constipation.  No chest pain or dyspnea or palpitations.  No melena or bright red blood per rectum or hematemesis or jaundice.  No dysuria, oliguria or hematuria or flank pain.  Upon presentation to the emergency room, blood pressure was 155/71 and later 160/58 with otherwise normal vital signs.  Labs revealed hypokalemia of 3 and magnesium level came back 1.8 with lipase levels 24 and mild anemia.  High-sensitivity troponin I was 15.  Urinalysis showed 130 glucose and 20 ketones.  Abdominal pelvic CT scan showed her cholecystectomy and intrahepatic and extrahepatic biliary dilatation with common duct measuring up to 15 mm with recommendation for MRCP, slight bilateral hydronephrosis that may be due to the urinary bladder distention and sigmoid diverticulosis with large volume of stool in the colon and rectum and small right greater than left pleural effusions with partial consolidation and adjacent groundglass density in the lower lobes reflecting atelectasis or pneumonia with no acute abdominal or pelvic abnormalities.  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  EKG showed normal sinus rhythm with a rate of 81 with left atrial enlargement.  The patient was given 1 L bolus of IV normal saline as well as 4 mg of IV Zofran twice and 10 leucovorin IV potassium chloride.  She was  disimpacted by the ER physician.  She will be admitted to medical monitored bed for further evaluation and management.   Interim History: -11/20: K 3.3 -->repleted - Pending MRCP   Subjective: Pt has dementia, only gives limited information.  Patient reports some abdominal pain and nausea vomiting.  Denies chest pain.  No fever or chills.  Assessment & Plan:   Principal Problem:   Intractable nausea and vomiting Active Problems:   Malignant neoplasm of urinary bladder (HCC)   Mixed Alzheimer's and vascular dementia ([place])   Essential (primary) hypertension   HLD (hyperlipidemia)   GERD (gastroesophageal reflux disease)   Aspiration pneumonia (HCC)   Bilateral hydronephrosis   Elevated troponin  Intractable nausea and vomiting and common bile duct dilatation with intra and extrahepatic biliary dilatation:  pt has constipation and fecal impaction. CT scan showed cholecystectomy and intrahepatic and extrahepatic biliary dilatation with common duct measuring up to 15 mm with recommendation for MRCP  -will continue hydration with IV normal saline. - Give laxatives  - GI, Dr. Allen Norris was consulted - pending MRCP - prn zofran  Suspected aspiration pneumonia with bilateral pleural effusion: -f/u Bx - IV Unasyn and Zithromax.   - sputum Gram stain culture -SLP  Hypokalemia: -Potassium will be replaced and magnesium level will be optimized.  Depression.   -Celexa.  Uncontrolled hypertension.   -We will continue Zestril and place the patient on as needed IV labetalol.  Dementia with behavioral changes.   -Continue Namenda and Seroquel.  Dyslipidemia.   -Statin  Elevated trop: trop 15 --> No CP, possible  demand ischemia. -trend trop -ASA and statin   bilateral hydronephrosis and hx of malignant neoplasm of urinary bladder: CT scan showed slight bilateral hydronephrosis that may be due to the urinary bladder distention and sigmoid diverticulosis with large volume of  stool in the colon and rectum. Cre normal 0.52 and BUN 9 -will place Foley in   DVT prophylaxis: sq Heparin Code Status: Full Family Communication: none, not at bedside Disposition Plan: Not ready for discharge.  Pt has multiple complicated presentations, particularly intractable nausea, vomiting with common bile duct dilation, which needs further work-up by GI   Consultants:  GI  Procedures:  Pending MRCP  Antimicrobials: Anti-infectives (From admission, onward)   Start     Dose/Rate Route Frequency Ordered Stop   10/20/19 0645  Ampicillin-Sulbactam (UNASYN) 3 g in sodium chloride 0.9 % 100 mL IVPB     3 g 200 mL/hr over 30 Minutes Intravenous Every 6 hours 10/20/19 0639     10/20/19 0645  azithromycin (ZITHROMAX) 500 mg in sodium chloride 0.9 % 250 mL IVPB     500 mg 250 mL/hr over 60 Minutes Intravenous Every 24 hours 10/20/19 0639            Objective: Vitals:   10/20/19 0432 10/20/19 0511 10/20/19 0630 10/20/19 0814  BP: (!) 175/78 (!) 183/78 (!) 162/61 (!) 169/66  Pulse: 94  (!) 105 (!) 106  Resp: (!) 21  (!) 25 (!) 22  Temp:      TempSrc:      SpO2: 100%  94% 100%  Weight:      Height:        Intake/Output Summary (Last 24 hours) at 10/20/2019 1433 Last data filed at 10/20/2019 0932 Gross per 24 hour  Intake 300 ml  Output -  Net 300 ml   Filed Weights   10/19/19 1520  Weight: 46 kg    Examination:  Physical Exam:  General: Not in acute distress HEENT: PERRL, EOMI, no scleral icterus, No JVD or bruit Cardiac: S1/S2, RRR, No murmurs, gallops or rubs Pulm:  No rales, wheezing, rhonchi or rubs. Abd: Soft, nondistended, has tenderness in RUQ and epigastric area, no rebound pain, no organomegaly, BS present Ext: No edema. 2+DP/PT pulse bilaterally Musculoskeletal: No joint deformities, erythema, or stiffness, ROM full Skin: No rashes.  Neuro: Alert, cranial nerves II-XII grossly intact, moves all extremeties. Psych: Patient is not psychotic, no  suicidal or hemocidal ideation.    Data Reviewed: I have personally reviewed following labs and imaging studies  CBC: Recent Labs  Lab 10/19/19 1538 10/20/19 0548  WBC 9.3 17.7*  HGB 11.2* 11.5*  HCT 33.4* 34.5*  MCV 91.3 92.7  PLT 459* [id number]*   Basic Metabolic Panel: Recent Labs  Lab 10/19/19 1538 10/19/19 1716 10/20/19 0548  NA 139  --  138  K 3.0*  --  3.3*  CL 104  --  104  CO2 22  --  22  GLUCOSE 155*  --  165*  BUN 11  --  9  CREATININE 0.59  --  0.52  CALCIUM 8.7*  --  8.6*  MG  --  1.8  --    GFR: Estimated Creatinine Clearance: 38.3 mL/min (by C-G formula based on SCr of 0.52 mg/dL). Liver Function Tests: Recent Labs  Lab 10/19/19 1538  AST 23  ALT 13  ALKPHOS 64  BILITOT 0.7  PROT 7.1  ALBUMIN 3.6   Recent Labs  Lab 10/19/19 1538  LIPASE 24   No  results for input(s): AMMONIA in the last 168 hours. Coagulation Profile: No results for input(s): INR, PROTIME in the last 168 hours. Cardiac Enzymes: No results for input(s): CKTOTAL, CKMB, CKMBINDEX, TROPONINI in the last 168 hours. BNP (last 3 results) No results for input(s): PROBNP in the last 8760 hours. HbA1C: No results for input(s): HGBA1C in the last 72 hours. CBG: No results for input(s): GLUCAP in the last 168 hours. Lipid Profile: No results for input(s): CHOL, HDL, LDLCALC, TRIG, CHOLHDL, LDLDIRECT in the last 72 hours. Thyroid Function Tests: No results for input(s): TSH, T4TOTAL, FREET4, T3FREE, THYROIDAB in the last 72 hours. Anemia Panel: No results for input(s): VITAMINB12, FOLATE, FERRITIN, TIBC, IRON, RETICCTPCT in the last 72 hours. Sepsis Labs: No results for input(s): PROCALCITON, LATICACIDVEN in the last 168 hours.  Recent Results (from the past 240 hour(s))  SARS CORONAVIRUS 2 (TAT 6-24 HRS) Nasopharyngeal Nasopharyngeal Swab     Status: None   Collection Time: 10/20/19  1:43 AM   Specimen: Nasopharyngeal Swab  Result Value Ref Range Status   SARS Coronavirus 2  NEGATIVE NEGATIVE Final    Comment: (NOTE) SARS-CoV-2 target nucleic acids are NOT DETECTED. The SARS-CoV-2 RNA is generally detectable in upper and lower respiratory specimens during the acute phase of infection. Negative results do not preclude SARS-CoV-2 infection, do not rule out co-infections with other pathogens, and should not be used as the sole basis for treatment or other patient management decisions. Negative results must be combined with clinical observations, patient history, and epidemiological information. The expected result is Negative. Fact Sheet for Patients: SugarRoll.be Fact Sheet for Healthcare Providers: https://www.woods-mathews.com/ This test is not yet approved or cleared by the [place] FDA and  has been authorized for detection and/or diagnosis of SARS-CoV-2 by FDA under an Emergency Use Authorization (EUA). This EUA will remain  in effect (meaning this test can be used) for the duration of the COVID-19 declaration under Section 56 4(b)(1) of the Act, 21 U.S.C. section 360bbb-3(b)(1), unless the authorization is terminated or revoked sooner. Performed at [facility], [place] [address], [place], [place] [address]      RN Pressure Injury Documentation:       Radiology Studies: Ct Head Wo Contrast  Result Date: 10/19/2019 CLINICAL DATA:  Unexplained altered level of consciousness. Vomiting. EXAM: CT HEAD WITHOUT CONTRAST TECHNIQUE: Contiguous axial images were obtained from the base of the skull through the vertex without intravenous contrast. COMPARISON:  MRI 07/26/2019 FINDINGS: Brain: No sign of acute infarction by CT. No brainstem or cerebellar abnormality is seen. Old left occipital infarction. Old right frontal infarction which was acute in August of this year. Small old left frontal cortical infarction. Chronic small-vessel ischemic changes of the white matter as seen previously. No mass lesion,  hemorrhage, hydrocephalus or extra-axial collection. Vascular: There is atherosclerotic calcification of the major vessels at the base of the brain. Skull: Negative Sinuses/Orbits: Clear/normal Other: None IMPRESSION: No acute finding by CT. Atrophy and chronic small-vessel ischemic changes. 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 Small old left frontal infarction as well. Electronically Signed   By: Nelson Chimes M.D.   On: 10/19/2019 18:02   Ct Abdomen Pelvis W Contrast  Result Date: 10/19/2019 CLINICAL DATA:  Abdomen distension with vomiting EXAM: CT ABDOMEN AND PELVIS WITH CONTRAST TECHNIQUE: Multidetector CT imaging of the abdomen and pelvis was performed using the standard protocol following bolus administration of intravenous contrast. CONTRAST:  17mL OMNIPAQUE IOHEXOL 300 MG/ML  SOLN COMPARISON:  CT lumbar spine 10/15/2018 FINDINGS: Lower chest: Small right greater than left pleural effusions at the lung bases. Partial consolidation and adjacent ground-glass density in the right greater than left lower lobes. Heart size within normal limits. Coronary vascular calcification. Moderate hiatal hernia. Hepatobiliary: Status post cholecystectomy. Mild intra hepatic and moderate extrahepatic biliary dilatation with common bile duct measuring up to 15 mm. Pancreas: Unremarkable. No pancreatic ductal dilatation or surrounding inflammatory changes. Spleen: Normal in size without focal abnormality. Adrenals/Urinary Tract: Adrenal glands are normal. Cysts in the right kidney. Mild bilateral hydronephrosis and prominence of the ureters. Very distended urinary bladder Stomach/Bowel: Stomach is within normal limits. Large amount of stool in the colon and rectum. Sigmoid colon diverticular disease without acute inflammatory change no evidence of bowel wall thickening, distention, or inflammatory changes. Vascular/Lymphatic: Moderate aortic atherosclerosis without aneurysm. No significantly enlarged lymph nodes  Reproductive: Status post hysterectomy. No adnexal masses. Other: Negative for free air or free fluid. Musculoskeletal: No acute osseous abnormality. Prior bilateral sacral plasty. Vertebral augmentation L1 through L5 with chronic appearing fracture deformities. No definite acute osseous abnormality IMPRESSION: 1. No CT evidence for acute intra-abdominal or pelvic abnormality. 2. Small right greater than left pleural effusions with partial consolidations and adjacent ground-glass density in the lower lobes either reflecting atelectasis or pneumonia 3. Status post cholecystectomy. Intra and extrahepatic biliary dilatation with common duct measuring up to 15 mm. Recommend correlation with LFTs with follow-up MRCP if clinically indicated 4. Slight hydronephrosis bilaterally, which may be secondary to urinary bladder distention 5. Sigmoid colon diverticular disease without acute inflammatory process. Large volume of stool in the colon and rectum Electronically Signed   By: Donavan Foil M.D.   On: 10/19/2019 18:11   Mr 3d Recon At Scanner  Result Date: 10/20/2019 CLINICAL DATA:  Unintentional weight loss. Nonlocalized abdominal pain. Biliary dilatation on abdominal CT. EXAM: MRI ABDOMEN WITHOUT AND WITH CONTRAST (INCLUDING MRCP) TECHNIQUE: Multiplanar multisequence MR imaging of the abdomen was performed both before and after the administration of intravenous contrast. Heavily T2-weighted images of the biliary and pancreatic ducts were obtained, and three-dimensional MRCP images were rendered by post processing. CONTRAST:  34mL GADAVIST GADOBUTROL 1 MMOL/ML IV SOLN COMPARISON:  Abdominopelvic CT 10/19/2019. Lumbar spine CT 10/15/2018. FINDINGS: Despite efforts by the technologist and patient, mild motion artifact is present on today's exam and could not be eliminated. This reduces exam sensitivity and specificity. Several sequences were repeated. Lower chest: There are dependent bilateral pleural effusions with  associated atelectasis at both lung bases. Hepatobiliary: The liver is normal in signal without focal abnormality or abnormal enhancement. Patient is status post cholecystectomy. As seen on recent CT, there is moderate intrahepatic and extrahepatic biliary dilatation. The common hepatic duct measures up to 13 mm in diameter. Although detail is mildly limited by the motion, there is no evidence of choledocholithiasis. The biliary dilatation appears similar to the prior lumbar spine CT and is likely physiologic in this patient without elevated liver function studies on testing done yesterday. Pancreas: The pancreatic duct is mildly dilated to 4 mm. No evidence of pancreatic mass, pancreas divisum or surrounding inflammation. Spleen: Normal in size without focal abnormality. Adrenals/Urinary Tract: Both adrenal glands appear normal. There are small bilateral renal cysts. The collecting system dilatation seen on yesterday's CT is improved, likely due to improved bladder distention. The bladder remains mildly distended on the coronal images. Stomach/Bowel: No evidence of bowel wall thickening, distention or surrounding inflammatory change. Vascular/Lymphatic: There are no enlarged abdominal lymph nodes.  Diffuse aortic and branch vessel atherosclerosis. Other: No ascites or focal extraluminal fluid collection. Musculoskeletal: Grossly stable compression deformities throughout the lumbar spine status post multilevel spinal augmentation. There is T2 hyperintensity within the sacral ala bilaterally status post sacroplasty. There is a left-sided disc protrusion at T10-11 which causes mild cord flattening, calcified on CT. IMPRESSION: 1. Moderate biliary dilatation status post cholecystectomy. No evidence of choledocholithiasis. Given similar appearance on previous lumbar CT and normal liver function studies, this is probably physiologic. 2. Mild pancreatic ductal dilatation without evidence of pancreatic mass or pancreas  divisum. 3. Bilateral pleural effusions with associated atelectasis at both lung bases. 4. Calcified left-sided disc protrusion at T10-11. Electronically Signed   By: Richardean Sale M.D.   On: 10/20/2019 13:55   Mr Abdomen Mrcp Moise Boring Contast  Result Date: 10/20/2019 CLINICAL DATA:  Unintentional weight loss. Nonlocalized abdominal pain. Biliary dilatation on abdominal CT. EXAM: MRI ABDOMEN WITHOUT AND WITH CONTRAST (INCLUDING MRCP) TECHNIQUE: Multiplanar multisequence MR imaging of the abdomen was performed both before and after the administration of intravenous contrast. Heavily T2-weighted images of the biliary and pancreatic ducts were obtained, and three-dimensional MRCP images were rendered by post processing. CONTRAST:  18mL GADAVIST GADOBUTROL 1 MMOL/ML IV SOLN COMPARISON:  Abdominopelvic CT 10/19/2019. Lumbar spine CT 10/15/2018. FINDINGS: Despite efforts by the technologist and patient, mild motion artifact is present on today's exam and could not be eliminated. This reduces exam sensitivity and specificity. Several sequences were repeated. Lower chest: There are dependent bilateral pleural effusions with associated atelectasis at both lung bases. Hepatobiliary: The liver is normal in signal without focal abnormality or abnormal enhancement. Patient is status post cholecystectomy. As seen on recent CT, there is moderate intrahepatic and extrahepatic biliary dilatation. The common hepatic duct measures up to 13 mm in diameter. Although detail is mildly limited by the motion, there is no evidence of choledocholithiasis. The biliary dilatation appears similar to the prior lumbar spine CT and is likely physiologic in this patient without elevated liver function studies on testing done yesterday. Pancreas: The pancreatic duct is mildly dilated to 4 mm. No evidence of pancreatic mass, pancreas divisum or surrounding inflammation. Spleen: Normal in size without focal abnormality. Adrenals/Urinary Tract: Both  adrenal glands appear normal. There are small bilateral renal cysts. The collecting system dilatation seen on yesterday's CT is improved, likely due to improved bladder distention. The bladder remains mildly distended on the coronal images. Stomach/Bowel: No evidence of bowel wall thickening, distention or surrounding inflammatory change. Vascular/Lymphatic: There are no enlarged abdominal lymph nodes. Diffuse aortic and branch vessel atherosclerosis. Other: No ascites or focal extraluminal fluid collection. Musculoskeletal: Grossly stable compression deformities throughout the lumbar spine status post multilevel spinal augmentation. There is T2 hyperintensity within the sacral ala bilaterally status post sacroplasty. There is a left-sided disc protrusion at T10-11 which causes mild cord flattening, calcified on CT. IMPRESSION: 1. Moderate biliary dilatation status post cholecystectomy. No evidence of choledocholithiasis. Given similar appearance on previous lumbar CT and normal liver function studies, this is probably physiologic. 2. Mild pancreatic ductal dilatation without evidence of pancreatic mass or pancreas divisum. 3. Bilateral pleural effusions with associated atelectasis at both lung bases. 4. Calcified left-sided disc protrusion at T10-11. Electronically Signed   By: Richardean Sale M.D.   On: 10/20/2019 13:55        Scheduled Meds: . [START ON 10/21/2019] aspirin EC  81 mg Oral Daily  . cholecalciferol  2,000 Units Oral Daily  . citalopram  20 mg  Oral Daily  . guaiFENesin  600 mg Oral BID  . heparin injection (subcutaneous)  5,000 Units Subcutaneous Q8H  . lisinopril  5 mg Oral Daily  . magnesium oxide  200 mg Oral Daily  . memantine  10 mg Oral BID  . pravastatin  20 mg Oral Daily  . QUEtiapine  25 mg Oral q morning - 10a   And  . QUEtiapine  50 mg Oral QHS  . sodium chloride flush  3 mL Intravenous Once  . traZODone  50 mg Oral QHS  . vitamin B-12  1,000 mcg Oral Daily    Continuous Infusions: . 0.9 % NaCl with KCl 40 mEq / L 100 mL/hr (10/20/19 1159)  . ampicillin-sulbactam (UNASYN) IV Stopped (10/20/19 0932)  . azithromycin       LOS: 0 days    Time spent: 30 min     Ivor Costa, DO [facility] PAGER is on [place]  If 7PM-7AM, please contact night-coverage [email or web address] Password [facility] 10/20/2019, 2:33 PM

## 2019-10-20 NOTE — H&P (Addendum)
[facility] NAME: Gabrielle Crosby    MR#:  [id number]  DATE OF BIRTH:  [birth year]  DATE OF ADMISSION:  10/19/2019  PRIMARY CARE PHYSICIAN: Toni Arthurs, NP   REQUESTING/REFERRING PHYSICIAN: Marjean [name], MD CHIEF COMPLAINT:   Chief Complaint  Patient presents with  . Emesis    HISTORY OF PRESENT ILLNESS:  Gabrielle Crosby  is a 85 y.o. Caucasian female with a known history of multiple medical problems that will be mentioned below, presented to the emergency room with acute onset of bilious vomitus as well as constipation with stool impaction which have been going on over the last couple days.  She denied any fever or chills.  She has been having abdominal discomfort with constipation.  No chest pain or dyspnea or palpitations.  No melena or bright red blood per rectum or hematemesis or jaundice.  No dysuria, oliguria or hematuria or flank pain.  Upon presentation to the emergency room, blood pressure was 155/71 and later 160/58 with otherwise normal vital signs.  Labs revealed hypokalemia of 3 and magnesium level came back 1.8 with lipase levels 24 and mild anemia.  High-sensitivity troponin I was 15.  Urinalysis showed 130 glucose and 20 ketones.  Abdominal pelvic CT scan showed her cholecystectomy and intrahepatic and extrahepatic biliary dilatation with common duct measuring up to 15 mm with recommendation for MRCP, slight bilateral hydronephrosis that may be due to the urinary bladder distention and sigmoid diverticulosis with large volume of stool in the colon and rectum and small right greater than left pleural effusions with partial consolidation and adjacent groundglass density in the lower lobes reflecting atelectasis or pneumonia with no acute abdominal or pelvic abnormalities.  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   EKG showed normal sinus rhythm with a rate of 81 with left atrial enlargement.  The patient was given 1 L bolus of IV normal saline as well as 4 mg of IV Zofran twice and 10 leucovorin IV potassium chloride.  She was disimpacted by the ER physician.  She will be admitted to medical monitored bed for further evaluation and management.  PAST MEDICAL HISTORY:   Past Medical History:  Diagnosis Date  . Arthritis    hands  . Bladder cancer ([place])   . Brain bleed ([place]) 2019   d/t light strokes, high bp, seizures. brain has shrunk per dr. Manuella Ghazi and mri  . Cancer ([place])    BLADDER X 2  . Dementia ([place]) 2019   dementia progressing  . Depression   . GERD (gastroesophageal reflux disease)    chokes easily even on own saliva and then has panic attack  . Hyperlipidemia   . Hypertension 2019   no medications.  no htn per daughter  . Seizures [facility])    family not aware of any seizures and no treatment  . Stroke [facility]) 2019   undiagnosed but per neuro, she has had them causing vascular dementia  . Wears dentures    partial upper and lower    PAST SURGICAL HISTORY:   Past Surgical History:  Procedure Laterality Date  . ABDOMINAL HYSTERECTOMY Bilateral    BILATERAL s&o  . BACK SURGERY    . BLADDER SURGERY N/A 2015   REMOVAL OF BLADDER CANCER X 2  . BOWEL RESECTION N/A 1999   SMALL BOWEL OBSTRUCTION  . CHOLECYSTECTOMY    .  COLONOSCOPY N/A 11/06/2015   Procedure: COLONOSCOPY;  Surgeon: Hulen Luster, MD;  Location: [place];  Service: Gastroenterology;  Laterality: N/A;  . CYSTOSCOPY WITH BIOPSY N/A 04/17/2015   Procedure: CYSTOSCOPY WITH BIOPSY;  Surgeon: Hollice Espy, MD;  Location: [facility];  Service: Urology;  Laterality: N/A;  . KYPHOPLASTY N/A 12/20/2017   Procedure: [id number];  Surgeon: Hessie Knows, MD;  Location: [facility];  Service: Orthopedics;  Laterality: N/A;  . KYPHOPLASTY N/A 04/21/2018   Procedure: KYPHOPLASTY-L2,L3,L4,L5;  Surgeon: Hessie Knows, MD;  Location:  [facility];  Service: Orthopedics;  Laterality: N/A;  . SACROPLASTY N/A 11/03/2018   Procedure: Dillard Cannon;  Surgeon: Hessie Knows, MD;  Location: [facility];  Service: Orthopedics;  Laterality: N/A;    SOCIAL HISTORY:   Social History   Tobacco Use  . Smoking status: Never Smoker  . Smokeless tobacco: Never Used  Substance Use Topics  . Alcohol use: Never    Alcohol/week: 0.0 standard drinks    Frequency: Never    FAMILY HISTORY:   Family History  Family history unknown: Yes    DRUG ALLERGIES:   Allergies  Allergen Reactions  . Atenolol Other (See Comments)    Bradycardia   . Donepezil Hcl Other (See Comments)    Mental changes, "made me crazy"    REVIEW OF SYSTEMS:   ROS As per history of present illness. All pertinent systems were reviewed above. Constitutional,  HEENT, cardiovascular, respiratory, GI, GU, musculoskeletal, neuro, psychiatric, endocrine,  integumentary and hematologic systems were reviewed and are otherwise  negative/unremarkable except for positive findings mentioned above in the HPI.   MEDICATIONS AT HOME:   Prior to Admission medications   Medication Sig Start Date End Date Taking? Authorizing Provider  aspirin EC 81 MG tablet Take 81 mg by mouth daily.   Yes [provider]  Cholecalciferol (VITAMIN D3) 50 MCG (2000 UT) capsule Take 2,000 Units by mouth daily.  05/10/19  Yes [provider]  citalopram (CELEXA) 20 MG tablet Take 20 mg by mouth daily.    Yes [provider]  ibuprofen (ADVIL) 600 MG tablet Take 600 mg by mouth every 6 (six) hours as needed.   Yes [provider]  lisinopril (ZESTRIL) 5 MG tablet Take 1 tablet (5 mg total) by mouth daily. 07/29/19  Yes Gouru, Illene Silver, MD  lovastatin (MEVACOR) 20 MG tablet Take 20 mg by mouth at bedtime.   Yes [provider]  memantine (NAMENDA) 10 MG tablet Take 10 mg by mouth 2 (two) times daily.  11/05/16  Yes [provider]  QUEtiapine  (SEROQUEL) 25 MG tablet Take 50 mg by mouth 2 (two) times daily. 25 mg qam  50 mg qhs 05/10/19 05/09/20 Yes [provider]  traZODone (DESYREL) 50 MG tablet Take 50 mg by mouth at bedtime.   Yes [provider]  vitamin B-12 (CYANOCOBALAMIN) 1000 MCG tablet Take 1,000 mcg by mouth daily.   Yes [provider]  acetaminophen (TYLENOL) 500 MG tablet Take 500 mg by mouth 2 (two) times daily.     [provider]  aspirin EC 325 MG EC tablet Take 1 tablet (325 mg total) by mouth daily. Patient not taking: Reported on 10/20/2019 07/29/19   Nicholes Mango, MD  atorvastatin (LIPITOR) 40 MG tablet Take 1 tablet (40 mg total) by mouth daily at 6 PM. Patient not taking: Reported on 10/20/2019 07/28/19   Nicholes Mango, MD  docusate sodium (COLACE) 50 MG capsule Take 50 mg by mouth  2 (two) times daily as needed for mild constipation.    [provider]  Magnesium 100 MG CAPS Take 100 mg by mouth daily.     [provider]  ondansetron (ZOFRAN) 4 MG tablet Take 1 tablet (4 mg total) by mouth daily as needed for nausea or vomiting. 10/19/19 10/18/20  Vanessa [place], MD  polyethylene glycol (MIRALAX) 17 g packet Take 17 g by mouth daily. 10/19/19   Vanessa [place], MD  sulfamethoxazole-trimethoprim (BACTRIM DS) 800-160 MG tablet Take 1 tablet by mouth every 12 (twelve) hours. Patient not taking: Reported on 10/20/2019 07/28/19   Nicholes Mango, MD      VITAL SIGNS:  Blood pressure (!) 183/78, pulse 94, temperature 99.3 F (37.4 C), temperature source Axillary, resp. rate (!) 21, height 5' (1.524 m), weight 46 kg, SpO2 100 %.  PHYSICAL EXAMINATION:  Physical Exam  GENERAL:  85 y.o.-year-old Caucasian female patient lying in the bed with no acute distress.  She was fairly somnolent and difficult to arouse. EYES: Pupils equal, round, reactive to light and accommodation. No scleral icterus. Extraocular muscles intact.  HEENT: Head atraumatic, normocephalic. Oropharynx  and nasopharynx clear.  NECK:  Supple, no jugular venous distention. No thyroid enlargement, no tenderness.  LUNGS: Slightly diminished bibasilar breath sounds. CARDIOVASCULAR: Regular rate and rhythm, S1, S2 normal. No murmurs, rubs, or gallops.  ABDOMEN: Soft, with epigastric and right upper quadrant tenderness without rebound tenderness guarding or rigidity.  Bowel sounds present. No organomegaly or mass.  EXTREMITIES: No pedal edema, cyanosis, or clubbing.  NEUROLOGIC: She was moving all 4 extremities.  No lateralizing signs.  PSYCHIATRIC: The patient is somnolent and difficult to arouse.  She would wake up and answer questions and easily fall asleep. SKIN: No obvious rash, lesion, or ulcer.   LABORATORY PANEL:   CBC Recent Labs  Lab 10/20/19 0548  WBC 17.7*  HGB 11.5*  HCT 34.5*  PLT 506*   ------------------------------------------------------------------------------------------------------------------  Chemistries  Recent Labs  Lab 10/19/19 1538 10/19/19 1716  NA 139  --   K 3.0*  --   CL 104  --   CO2 22  --   GLUCOSE 155*  --   BUN 11  --   CREATININE 0.59  --   CALCIUM 8.7*  --   MG  --  1.8  AST 23  --   ALT 13  --   ALKPHOS 64  --   BILITOT 0.7  --    ------------------------------------------------------------------------------------------------------------------  Cardiac Enzymes No results for input(s): TROPONINI in the last 168 hours. ------------------------------------------------------------------------------------------------------------------  RADIOLOGY:  Ct Head Wo Contrast  Result Date: 10/19/2019 CLINICAL DATA:  Unexplained altered level of consciousness. Vomiting. EXAM: CT HEAD WITHOUT CONTRAST TECHNIQUE: Contiguous axial images were obtained from the base of the skull through the vertex without intravenous contrast. COMPARISON:  MRI 07/26/2019 FINDINGS: Brain: No sign of acute infarction by CT. No brainstem or cerebellar abnormality is seen.  Old left occipital infarction. Old right frontal infarction which was acute in August of this year. Small old left frontal cortical infarction. Chronic small-vessel ischemic changes of the white matter as seen previously. No mass lesion, hemorrhage, hydrocephalus or extra-axial collection. Vascular: There is atherosclerotic calcification of the major vessels at the base of the brain. Skull: Negative Sinuses/Orbits: Clear/normal Other: None IMPRESSION: No acute finding by CT. Atrophy and chronic small-vessel ischemic changes. 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 Small old left frontal infarction as well. Electronically Signed   By: Elta [place]  Shogry M.D.   On: 10/19/2019 18:02   Ct Abdomen Pelvis W Contrast  Result Date: 10/19/2019 CLINICAL DATA:  Abdomen distension with vomiting EXAM: CT ABDOMEN AND PELVIS WITH CONTRAST TECHNIQUE: Multidetector CT imaging of the abdomen and pelvis was performed using the standard protocol following bolus administration of intravenous contrast. CONTRAST:  34mL OMNIPAQUE IOHEXOL 300 MG/ML  SOLN COMPARISON:  CT lumbar spine 10/15/2018 FINDINGS: Lower chest: Small right greater than left pleural effusions at the lung bases. Partial consolidation and adjacent ground-glass density in the right greater than left lower lobes. Heart size within normal limits. Coronary vascular calcification. Moderate hiatal hernia. Hepatobiliary: Status post cholecystectomy. Mild intra hepatic and moderate extrahepatic biliary dilatation with common bile duct measuring up to 15 mm. Pancreas: Unremarkable. No pancreatic ductal dilatation or surrounding inflammatory changes. Spleen: Normal in size without focal abnormality. Adrenals/Urinary Tract: Adrenal glands are normal. Cysts in the right kidney. Mild bilateral hydronephrosis and prominence of the ureters. Very distended urinary bladder Stomach/Bowel: Stomach is within normal limits. Large amount of stool in the colon and rectum. Sigmoid colon  diverticular disease without acute inflammatory change no evidence of bowel wall thickening, distention, or inflammatory changes. Vascular/Lymphatic: Moderate aortic atherosclerosis without aneurysm. No significantly enlarged lymph nodes Reproductive: Status post hysterectomy. No adnexal masses. Other: Negative for free air or free fluid. Musculoskeletal: No acute osseous abnormality. Prior bilateral sacral plasty. Vertebral augmentation L1 through L5 with chronic appearing fracture deformities. No definite acute osseous abnormality IMPRESSION: 1. No CT evidence for acute intra-abdominal or pelvic abnormality. 2. Small right greater than left pleural effusions with partial consolidations and adjacent ground-glass density in the lower lobes either reflecting atelectasis or pneumonia 3. Status post cholecystectomy. Intra and extrahepatic biliary dilatation with common duct measuring up to 15 mm. Recommend correlation with LFTs with follow-up MRCP if clinically indicated 4. Slight hydronephrosis bilaterally, which may be secondary to urinary bladder distention 5. Sigmoid colon diverticular disease without acute inflammatory process. Large volume of stool in the colon and rectum Electronically Signed   By: Donavan Foil M.D.   On: 10/19/2019 18:11      IMPRESSION AND PLAN:   1.  Intractable nausea and vomiting with associated severe constipation and fecal impaction.  The patient will be admitted to the medical monitored bed.  She will be placed on hydration with IV normal saline.  As needed antiemetics will be provided.  Her constipation will be managed.  2.  Suspected aspiration pneumonia with bilateral pleural effusion.  Will obtain blood cultures and place her on IV Unasyn and Zithromax.  Will check her sputum Gram stain culture and sensitivity and place her on mucolytic's.  She will be kept n.p.o. for now.  Speech therapy consult will be obtained.  3.  Common bile duct dilatation with intra and  extrahepatic biliary dilatation.  An MRCP was ordered as well as GI consultation with Dr. Allen Norris.  I notified him about the patient.  We will keep the patient n.p.o.  4.  Hypokalemia.  Potassium will be replaced and magnesium level will be optimized.  5.  Depression.  We will continue Celexa.  6.  Uncontrolled hypertension.  We will continue Zestril and place the patient on as needed IV labetalol.  7.  Dementia with behavioral changes.  We will continue Namenda and Seroquel.  8.  Dyslipidemia.  Statin therapy will be held off for now  9.  DVT prophylaxis.  Subcutaneous Lovenox.  All the records are reviewed and case discussed with ED provider.  The plan of care was discussed in details with the patient (and family). I answered all questions. The patient agreed to proceed with the above mentioned plan. Further management will depend upon hospital course.   CODE STATUS: Full code  TOTAL TIME TAKING CARE OF THIS PATIENT: 55 minutes.    Christel Mormon M.D on 10/20/2019 at 6:23 AM  [facility]   From 7 PM-7 AM, contact night-coverage [email or web address]  CC: Primary care physician; Toni Arthurs, NP   Note: This dictation was prepared with Dragon dictation along with smaller phrase technology. Any transcriptional errors that result from this process are unintentional.

## 2019-10-20 NOTE — ED Notes (Signed)
Hospitalist to bedside.

## 2019-10-20 NOTE — ED Notes (Signed)
Lab notified to draw Blood Cultures.

## 2019-10-20 NOTE — ED Notes (Signed)
Patient transported to MRI 

## 2019-10-20 NOTE — ED Notes (Signed)
Per MRI, pt did not tolerate MRCP, becoming very upset and agitated.  Imaging unsuccessful at this time.  Will notify Hospitalist.

## 2019-10-20 NOTE — Consult Note (Signed)
Gabrielle Lame, MD [facility]  [address] [place], [place] [address] Phone: [phone] Fax : [phone]  Consultation  Referring Provider:     Dr. Jari Pigg Primary Care Physician:  Toni Arthurs, NP Primary Gastroenterologist: Althia Forts         Reason for Consultation:     Common bile duct dilation  Date of Admission:  10/19/2019 Date of Consultation:  10/20/2019         HPI:   Gabrielle Crosby is a 85 y.o. female who came to the hospital with nausea and vomiting and was found to have a dilated common bile duct.  The patient's daughter states that since the patient has had Alzheimer's that has progressed she had to put her in a group home.  The patient's daughter got a call from the group home stating that the mother had been having a lot of nausea and vomiting.  The patient was brought to the emergency room and was doing well in the hospital and was about to be sent home when she started to have vomiting again.  The patient had a CT scan of the abdomen yesterday that showed:  IMPRESSION: 1. No CT evidence for acute intra-abdominal or pelvic abnormality. 2. Small right greater than left pleural effusions with partial consolidations and adjacent ground-glass density in the lower lobes either reflecting atelectasis or pneumonia 3. Status post cholecystectomy. Intra and extrahepatic biliary dilatation with common duct measuring up to 15 mm. Recommend correlation with LFTs with follow-up MRCP if clinically indicated 4. Slight hydronephrosis bilaterally, which may be secondary to urinary bladder distention 5. Sigmoid colon diverticular disease without acute inflammatory process. Large volume of stool in the colon and rectum  Because of these findings the patient was sent for MRI.  The MRI showed:  IMPRESSION: 1. Moderate biliary dilatation status post cholecystectomy. No evidence of choledocholithiasis. Given similar appearance on previous lumbar CT and normal liver function  studies, this is probably physiologic. 2. Mild pancreatic ductal dilatation without evidence of pancreatic mass or pancreas divisum. 3. Bilateral pleural effusions with associated atelectasis at both lung bases. 4. Calcified left-sided disc protrusion at T10-11.  The patient is unable to any meaningful history due to her Alzheimer's.  The patient was also found to have a considerable amount of stool in the colon with impaction.  The patient's liver enzymes were normal.   Past Medical History:  Diagnosis Date  . Arthritis    hands  . Bladder cancer ([place])   . Brain bleed ([place]) 2019   d/t light strokes, high bp, seizures. brain has shrunk per dr. Manuella Ghazi and mri  . Cancer ([place])    BLADDER X 2  . Dementia ([place]) 2019   dementia progressing  . Depression   . GERD (gastroesophageal reflux disease)    chokes easily even on own saliva and then has panic attack  . Hyperlipidemia   . Hypertension 2019   no medications.  no htn per daughter  . Seizures [facility])    family not aware of any seizures and no treatment  . Stroke [facility]) 2019   undiagnosed but per neuro, she has had them causing vascular dementia  . Wears dentures    partial upper and lower    Past Surgical History:  Procedure Laterality Date  . ABDOMINAL HYSTERECTOMY Bilateral    BILATERAL s&o  . BACK SURGERY    . BLADDER SURGERY N/A 2015   REMOVAL OF BLADDER CANCER X 2  . BOWEL RESECTION N/A  1999   SMALL BOWEL OBSTRUCTION  . CHOLECYSTECTOMY    . COLONOSCOPY N/A 11/06/2015   Procedure: COLONOSCOPY;  Surgeon: Hulen Luster, MD;  Location: [facility];  Service: Gastroenterology;  Laterality: N/A;  . CYSTOSCOPY WITH BIOPSY N/A 04/17/2015   Procedure: CYSTOSCOPY WITH BIOPSY;  Surgeon: Hollice Espy, MD;  Location: [facility];  Service: Urology;  Laterality: N/A;  . KYPHOPLASTY N/A 12/20/2017   Procedure: [id number];  Surgeon: Hessie Knows, MD;  Location: [facility];  Service: Orthopedics;  Laterality: N/A;  . KYPHOPLASTY N/A  04/21/2018   Procedure: KYPHOPLASTY-L2,L3,L4,L5;  Surgeon: Hessie Knows, MD;  Location: [facility];  Service: Orthopedics;  Laterality: N/A;  . SACROPLASTY N/A 11/03/2018   Procedure: Dillard Cannon;  Surgeon: Hessie Knows, MD;  Location: [facility];  Service: Orthopedics;  Laterality: N/A;    Prior to Admission medications   Medication Sig Start Date End Date Taking? Authorizing Provider  aspirin EC 81 MG tablet Take 81 mg by mouth daily.   Yes [provider]  Cholecalciferol (VITAMIN D3) 50 MCG (2000 UT) capsule Take 2,000 Units by mouth daily.  05/10/19  Yes [provider]  citalopram (CELEXA) 20 MG tablet Take 20 mg by mouth daily.    Yes [provider]  ibuprofen (ADVIL) 600 MG tablet Take 600 mg by mouth every 6 (six) hours as needed.   Yes [provider]  lisinopril (ZESTRIL) 5 MG tablet Take 1 tablet (5 mg total) by mouth daily. 07/29/19  Yes Gouru, Illene Silver, MD  lovastatin (MEVACOR) 20 MG tablet Take 20 mg by mouth at bedtime.   Yes [provider]  memantine (NAMENDA) 10 MG tablet Take 10 mg by mouth 2 (two) times daily.  11/05/16  Yes [provider]  QUEtiapine (SEROQUEL) 25 MG tablet Take 50 mg by mouth 2 (two) times daily. 25 mg qam  50 mg qhs 05/10/19 05/09/20 Yes [provider]  traZODone (DESYREL) 50 MG tablet Take 50 mg by mouth at bedtime.   Yes [provider]  vitamin B-12 (CYANOCOBALAMIN) 1000 MCG tablet Take 1,000 mcg by mouth daily.   Yes [provider]  acetaminophen (TYLENOL) 500 MG tablet Take 500 mg by mouth 2 (two) times daily.     [provider]  aspirin EC 325 MG EC tablet Take 1 tablet (325 mg total) by mouth daily. Patient not taking: Reported on 10/20/2019 07/29/19   Nicholes Mango, MD  atorvastatin (LIPITOR) 40 MG tablet Take 1 tablet (40 mg total) by mouth daily at 6 PM. Patient not taking: Reported on 10/20/2019 07/28/19   Nicholes Mango, MD  docusate sodium (COLACE) 50 MG  capsule Take 50 mg by mouth 2 (two) times daily as needed for mild constipation.    [provider]  Magnesium 100 MG CAPS Take 100 mg by mouth daily.     [provider]  ondansetron (ZOFRAN) 4 MG tablet Take 1 tablet (4 mg total) by mouth daily as needed for nausea or vomiting. 10/19/19 10/18/20  Vanessa Bethune, MD  polyethylene glycol (MIRALAX) 17 g packet Take 17 g by mouth daily. 10/19/19   Vanessa [place], MD  sulfamethoxazole-trimethoprim (BACTRIM DS) 800-160 MG tablet Take 1 tablet by mouth every 12 (twelve) hours. Patient not taking: Reported on 10/20/2019 07/28/19   Nicholes Mango, MD    Family History  Family history unknown: Yes     Social History   Tobacco Use  . Smoking status: Never Smoker  . Smokeless tobacco: Never Used  Substance Use Topics  . Alcohol use: Never    Alcohol/week: 0.0 standard drinks    Frequency: Never  . Drug use: No    Allergies as of 10/19/2019 - Review Complete 10/19/2019  Allergen Reaction Noted  . Atenolol Other (See Comments) 06/07/2015  . Donepezil hcl Other (See Comments) 06/07/2015    Review of Systems:    All systems reviewed and negative except where noted in HPI.   Physical Exam:  Vital signs in last 24 hours: Temp:  [99 F (37.2 C)-99.5 F (37.5 C)] 99.5 F (37.5 C) (11/20 1747) Pulse Rate:  [80-106] 98 (11/20 1747) Resp:  [18-25] 18 (11/20 1747) BP: (142-183)/(61-84) 170/84 (11/20 1747) SpO2:  [94 %-100 %] 94 % (11/20 1747) Last BM Date: 10/20/19 General:   Nonverbal NAD Head:  Normocephalic and atraumatic. Eyes:   No icterus.   Conjunctiva pink. PERRLA. Ears:  Normal auditory acuity. Neck:  Supple; no masses or thyroidomegaly Lungs: Respirations even and unlabored. Lungs clear to auscultation bilaterally.   No wheezes, crackles, or rhonchi.  Heart:  Regular rate and rhythm;  Without murmur, clicks, rubs or gallops Abdomen:  Soft, nondistended, nontender. Normal bowel sounds. No appreciable masses or  hepatomegaly.  No rebound or guarding.  Rectal:  Not performed. Msk:  Symmetrical without gross deformities.    Extremities:  Without edema, cyanosis or clubbing. Neurologic: Patient nonverbal with dementia grossly normal neurologically. Skin:  Intact without significant lesions or rashes. Cervical Nodes:  No significant cervical adenopathy.  LAB RESULTS: Recent Labs    10/19/19 1538 10/20/19 0548  WBC 9.3 17.7*  HGB 11.2* 11.5*  HCT 33.4* 34.5*  PLT 459* 506*   BMET Recent Labs    10/19/19 1538 10/20/19 0548  NA 139 138  K 3.0* 3.3*  CL 104 104  CO2 22 22  GLUCOSE 155* 165*  BUN 11 9  CREATININE 0.59 0.52  CALCIUM 8.7* 8.6*   LFT Recent Labs    10/19/19 1538  PROT 7.1  ALBUMIN 3.6  AST 23  ALT 13  ALKPHOS 64  BILITOT 0.7   PT/INR No results for input(s): LABPROT, INR in the last 72 hours.  STUDIES: Ct Head Wo Contrast  Result Date: 10/19/2019 CLINICAL DATA:  Unexplained altered level of consciousness. Vomiting. EXAM: CT HEAD WITHOUT CONTRAST TECHNIQUE: Contiguous axial images were obtained from the base of the skull through the vertex without intravenous contrast. COMPARISON:  MRI 07/26/2019 FINDINGS: Brain: No sign of acute infarction by CT. No brainstem or cerebellar abnormality is seen. Old left occipital infarction. Old right frontal infarction which was acute in August of this year. Small old left frontal cortical infarction. Chronic small-vessel ischemic changes of the white matter as seen previously. No mass lesion, hemorrhage, hydrocephalus or extra-axial collection. Vascular: There is atherosclerotic calcification of the major vessels at the base of the brain. Skull: Negative Sinuses/Orbits: Clear/normal Other: None IMPRESSION: No acute finding by CT. Atrophy and chronic small-vessel ischemic changes. 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 Small old left frontal infarction as well. Electronically Signed   By: Nelson Chimes M.D.   On: 10/19/2019  18:02   Ct Abdomen Pelvis W Contrast  Result Date: 10/19/2019 CLINICAL DATA:  Abdomen distension with vomiting EXAM: CT ABDOMEN AND PELVIS WITH CONTRAST TECHNIQUE: Multidetector CT imaging of the abdomen and pelvis was performed using the standard protocol following bolus administration of intravenous contrast. CONTRAST:  59mL OMNIPAQUE IOHEXOL 300 MG/ML  SOLN COMPARISON:  CT lumbar spine 10/15/2018 FINDINGS: Lower chest:  Small right greater than left pleural effusions at the lung bases. Partial consolidation and adjacent ground-glass density in the right greater than left lower lobes. Heart size within normal limits. Coronary vascular calcification. Moderate hiatal hernia. Hepatobiliary: Status post cholecystectomy. Mild intra hepatic and moderate extrahepatic biliary dilatation with common bile duct measuring up to 15 mm. Pancreas: Unremarkable. No pancreatic ductal dilatation or surrounding inflammatory changes. Spleen: Normal in size without focal abnormality. Adrenals/Urinary Tract: Adrenal glands are normal. Cysts in the right kidney. Mild bilateral hydronephrosis and prominence of the ureters. Very distended urinary bladder Stomach/Bowel: Stomach is within normal limits. Large amount of stool in the colon and rectum. Sigmoid colon diverticular disease without acute inflammatory change no evidence of bowel wall thickening, distention, or inflammatory changes. Vascular/Lymphatic: Moderate aortic atherosclerosis without aneurysm. No significantly enlarged lymph nodes Reproductive: Status post hysterectomy. No adnexal masses. Other: Negative for free air or free fluid. Musculoskeletal: No acute osseous abnormality. Prior bilateral sacral plasty. Vertebral augmentation L1 through L5 with chronic appearing fracture deformities. No definite acute osseous abnormality IMPRESSION: 1. No CT evidence for acute intra-abdominal or pelvic abnormality. 2. Small right greater than left pleural effusions with partial  consolidations and adjacent ground-glass density in the lower lobes either reflecting atelectasis or pneumonia 3. Status post cholecystectomy. Intra and extrahepatic biliary dilatation with common duct measuring up to 15 mm. Recommend correlation with LFTs with follow-up MRCP if clinically indicated 4. Slight hydronephrosis bilaterally, which may be secondary to urinary bladder distention 5. Sigmoid colon diverticular disease without acute inflammatory process. Large volume of stool in the colon and rectum Electronically Signed   By: Donavan Foil M.D.   On: 10/19/2019 18:11   Mr 3d Recon At Scanner  Result Date: 10/20/2019 CLINICAL DATA:  Unintentional weight loss. Nonlocalized abdominal pain. Biliary dilatation on abdominal CT. EXAM: MRI ABDOMEN WITHOUT AND WITH CONTRAST (INCLUDING MRCP) TECHNIQUE: Multiplanar multisequence MR imaging of the abdomen was performed both before and after the administration of intravenous contrast. Heavily T2-weighted images of the biliary and pancreatic ducts were obtained, and three-dimensional MRCP images were rendered by post processing. CONTRAST:  79mL GADAVIST GADOBUTROL 1 MMOL/ML IV SOLN COMPARISON:  Abdominopelvic CT 10/19/2019. Lumbar spine CT 10/15/2018. FINDINGS: Despite efforts by the technologist and patient, mild motion artifact is present on today's exam and could not be eliminated. This reduces exam sensitivity and specificity. Several sequences were repeated. Lower chest: There are dependent bilateral pleural effusions with associated atelectasis at both lung bases. Hepatobiliary: The liver is normal in signal without focal abnormality or abnormal enhancement. Patient is status post cholecystectomy. As seen on recent CT, there is moderate intrahepatic and extrahepatic biliary dilatation. The common hepatic duct measures up to 13 mm in diameter. Although detail is mildly limited by the motion, there is no evidence of choledocholithiasis. The biliary dilatation  appears similar to the prior lumbar spine CT and is likely physiologic in this patient without elevated liver function studies on testing done yesterday. Pancreas: The pancreatic duct is mildly dilated to 4 mm. No evidence of pancreatic mass, pancreas divisum or surrounding inflammation. Spleen: Normal in size without focal abnormality. Adrenals/Urinary Tract: Both adrenal glands appear normal. There are small bilateral renal cysts. The collecting system dilatation seen on yesterday's CT is improved, likely due to improved bladder distention. The bladder remains mildly distended on the coronal images. Stomach/Bowel: No evidence of bowel wall thickening, distention or surrounding inflammatory change. Vascular/Lymphatic: There are no enlarged abdominal lymph nodes. Diffuse aortic and branch vessel atherosclerosis. Other:  No ascites or focal extraluminal fluid collection. Musculoskeletal: Grossly stable compression deformities throughout the lumbar spine status post multilevel spinal augmentation. There is T2 hyperintensity within the sacral ala bilaterally status post sacroplasty. There is a left-sided disc protrusion at T10-11 which causes mild cord flattening, calcified on CT. IMPRESSION: 1. Moderate biliary dilatation status post cholecystectomy. No evidence of choledocholithiasis. Given similar appearance on previous lumbar CT and normal liver function studies, this is probably physiologic. 2. Mild pancreatic ductal dilatation without evidence of pancreatic mass or pancreas divisum. 3. Bilateral pleural effusions with associated atelectasis at both lung bases. 4. Calcified left-sided disc protrusion at T10-11. Electronically Signed   By: Richardean Sale M.D.   On: 10/20/2019 13:55   Mr Abdomen Mrcp Moise Boring Contast  Result Date: 10/20/2019 CLINICAL DATA:  Unintentional weight loss. Nonlocalized abdominal pain. Biliary dilatation on abdominal CT. EXAM: MRI ABDOMEN WITHOUT AND WITH CONTRAST (INCLUDING MRCP)  TECHNIQUE: Multiplanar multisequence MR imaging of the abdomen was performed both before and after the administration of intravenous contrast. Heavily T2-weighted images of the biliary and pancreatic ducts were obtained, and three-dimensional MRCP images were rendered by post processing. CONTRAST:  60mL GADAVIST GADOBUTROL 1 MMOL/ML IV SOLN COMPARISON:  Abdominopelvic CT 10/19/2019. Lumbar spine CT 10/15/2018. FINDINGS: Despite efforts by the technologist and patient, mild motion artifact is present on today's exam and could not be eliminated. This reduces exam sensitivity and specificity. Several sequences were repeated. Lower chest: There are dependent bilateral pleural effusions with associated atelectasis at both lung bases. Hepatobiliary: The liver is normal in signal without focal abnormality or abnormal enhancement. Patient is status post cholecystectomy. As seen on recent CT, there is moderate intrahepatic and extrahepatic biliary dilatation. The common hepatic duct measures up to 13 mm in diameter. Although detail is mildly limited by the motion, there is no evidence of choledocholithiasis. The biliary dilatation appears similar to the prior lumbar spine CT and is likely physiologic in this patient without elevated liver function studies on testing done yesterday. Pancreas: The pancreatic duct is mildly dilated to 4 mm. No evidence of pancreatic mass, pancreas divisum or surrounding inflammation. Spleen: Normal in size without focal abnormality. Adrenals/Urinary Tract: Both adrenal glands appear normal. There are small bilateral renal cysts. The collecting system dilatation seen on yesterday's CT is improved, likely due to improved bladder distention. The bladder remains mildly distended on the coronal images. Stomach/Bowel: No evidence of bowel wall thickening, distention or surrounding inflammatory change. Vascular/Lymphatic: There are no enlarged abdominal lymph nodes. Diffuse aortic and branch vessel  atherosclerosis. Other: No ascites or focal extraluminal fluid collection. Musculoskeletal: Grossly stable compression deformities throughout the lumbar spine status post multilevel spinal augmentation. There is T2 hyperintensity within the sacral ala bilaterally status post sacroplasty. There is a left-sided disc protrusion at T10-11 which causes mild cord flattening, calcified on CT. IMPRESSION: 1. Moderate biliary dilatation status post cholecystectomy. No evidence of choledocholithiasis. Given similar appearance on previous lumbar CT and normal liver function studies, this is probably physiologic. 2. Mild pancreatic ductal dilatation without evidence of pancreatic mass or pancreas divisum. 3. Bilateral pleural effusions with associated atelectasis at both lung bases. 4. Calcified left-sided disc protrusion at T10-11. Electronically Signed   By: Richardean Sale M.D.   On: 10/20/2019 13:55      Impression / Plan:   Assessment: Principal Problem:   Intractable nausea and vomiting Active Problems:   Malignant neoplasm of urinary bladder (HCC)   Mixed Alzheimer's and vascular dementia ([place])   Essential (primary)  hypertension   HLD (hyperlipidemia)   GERD (gastroesophageal reflux disease)   Aspiration pneumonia (H[facility])   Bilateral hydronephrosis   Elevated troponin   Gabrielle Crosby is a 85 y.o. y/o female with a abnormal finding on MRI of a dilated common bile duct that was also seen on her CT scan.  The radiologist had reported that the dilated not changed from the prior imaging.  Patient has had nausea and vomiting without anything seen on the CT scan MRI to explain the patient's nausea and vomiting.  Plan:  The patient will be monitored and see if her nausea and vomiting resolved.  There is no sign of abnormal liver enzymes to think that the dilated bile duct is contributing in any way to her nausea vomiting.  The patient should be treated with laxatives to continue to help her clear out  her bowels in case this is one of the reasons she is having her nausea and vomiting.  If the patient continues to have nausea and vomiting she may need an upper endoscopy to evaluate her esophagus and stomach to make sure she does not have an obstructive lesion.  The patient's daughter has been explained the plan and agrees with it.  Thank you for involving me in the care of this patient.      LOS: 0 days   Gabrielle Lame, MD  10/20/2019, 6:47 PM Pager [phone] 7am-5pm  Check AMION for 5pm -7am coverage and on weekends   Note: This dictation was prepared with Dragon dictation along with smaller phrase technology. Any transcriptional errors that result from this process are unintentional.

## 2019-10-21 ENCOUNTER — Inpatient Hospital Stay: Payer: Medicare Other

## 2019-10-21 DIAGNOSIS — E785 Hyperlipidemia, unspecified: Secondary | ICD-10-CM

## 2019-10-21 DIAGNOSIS — R112 Nausea with vomiting, unspecified: Secondary | ICD-10-CM

## 2019-10-21 DIAGNOSIS — K5909 Other constipation: Secondary | ICD-10-CM

## 2019-10-21 LAB — BASIC METABOLIC PANEL
Anion gap: 12 (ref 5–15)
BUN: 12 mg/dL (ref 8–23)
CO2: 18 mmol/L — ABNORMAL LOW (ref 22–32)
Calcium: 8.4 mg/dL — ABNORMAL LOW (ref 8.9–10.3)
Chloride: 109 mmol/L (ref 98–111)
Creatinine, Ser: 0.38 mg/dL — ABNORMAL LOW (ref 0.44–1.00)
GFR calc Af Amer: >60 mL/min (ref >60)
GFR calc non Af Amer: >60 mL/min (ref >60)
Glucose, Bld: 119 mg/dL — ABNORMAL HIGH (ref 70–99)
Potassium: 4 mmol/L (ref 3.5–5.1)
Sodium: 139 mmol/L (ref 135–145)

## 2019-10-21 LAB — CBC
HCT: 33 % — ABNORMAL LOW (ref 36.0–46.0)
Hemoglobin: 10.6 g/dL — ABNORMAL LOW (ref 12.0–15.0)
MCH: 30.4 pg (ref 26.0–34.0)
MCHC: 32.1 g/dL (ref 30.0–36.0)
MCV: 94.6 fL (ref 80.0–100.0)
Platelets: 461 10*3/uL — ABNORMAL HIGH (ref 150–400)
RBC: 3.49 MIL/uL — ABNORMAL LOW (ref 3.87–5.11)
RDW: 13.6 % (ref 11.5–15.5)
WBC: 17.7 10*3/uL — ABNORMAL HIGH (ref 4.0–10.5)
nRBC: 0 % (ref 0.0–0.2)

## 2019-10-21 MED ORDER — BISACODYL 10 MG RE SUPP
10.0000 mg | Freq: Every day | RECTAL | Status: DC
  Administered 2019-10-21 – 2019-10-22 (×2): 10 mg via RECTAL
  Filled 2019-10-21 (×4): qty 1

## 2019-10-21 MED ORDER — VANCOMYCIN HCL 1.25 G IV SOLR
1250.0000 mg | INTRAVENOUS | Status: DC
  Administered 2019-10-21: 1250 mg via INTRAVENOUS
  Filled 2019-10-21 (×2): qty 1250

## 2019-10-21 MED ORDER — SODIUM CHLORIDE 0.9 % IV SOLN
3.0000 g | Freq: Four times a day (QID) | INTRAVENOUS | Status: DC
  Administered 2019-10-21 – 2019-10-24 (×11): 3 g via INTRAVENOUS
  Filled 2019-10-21 (×2): qty 8
  Filled 2019-10-21 (×3): qty 3
  Filled 2019-10-21: qty 8
  Filled 2019-10-21 (×4): qty 3
  Filled 2019-10-21: qty 8
  Filled 2019-10-21 (×2): qty 3
  Filled 2019-10-21: qty 8
  Filled 2019-10-21: qty 3

## 2019-10-21 MED ORDER — GUAIFENESIN 100 MG/5ML PO SOLN
10.0000 mL | ORAL | Status: DC
  Administered 2019-10-21 – 2019-10-25 (×16): 200 mg via ORAL
  Filled 2019-10-21 (×24): qty 10

## 2019-10-21 MED ORDER — ASPIRIN 81 MG PO CHEW
81.0000 mg | CHEWABLE_TABLET | Freq: Every day | ORAL | Status: DC
  Administered 2019-10-21 – 2019-10-25 (×5): 81 mg via ORAL
  Filled 2019-10-21 (×5): qty 1

## 2019-10-21 MED ORDER — VANCOMYCIN HCL 10 G IV SOLR
1250.0000 mg | INTRAVENOUS | Status: DC
  Filled 2019-10-21: qty 1250

## 2019-10-21 MED ORDER — SODIUM CHLORIDE 0.9 % IV SOLN
INTRAVENOUS | Status: DC
  Administered 2019-10-21 – 2019-10-22 (×2): via INTRAVENOUS

## 2019-10-21 NOTE — Progress Notes (Signed)
Cephas Darby, MD [address]  [place]  [place], [place] [address]  Main: [phone]  Fax: [phone] Pager: [phone]   Subjective: Nausea and vomiting have improved today.  Speech therapist is bedside for swallow evaluation.  Minimal p.o. intake, minimally verbal, appears comfortable sitting up in bed   Objective: Vital signs in last 24 hours: Vitals:   10/20/19 1747 10/20/19 2137 10/21/19 0524 10/21/19 1345  BP: (!) 170/84 (!) 168/87 (!) 149/89 (!) 175/81  Pulse: 98 (!) 105 97   Resp: 18 18 20    Temp: 99.5 F (37.5 C) 99 F (37.2 C) 98.4 F (36.9 C)   TempSrc: Axillary Oral Oral   SpO2: 94% 95% 96%   Weight:      Height:       Weight change:   Intake/Output Summary (Last 24 hours) at 10/21/2019 1550 Last data filed at 10/21/2019 1500 Gross per 24 hour  Intake 2452.38 ml  Output 900 ml  Net 1552.38 ml     Exam: Heart:: Regular rate and rhythm, S1S2 present or without murmur or extra heart sounds Lungs: normal and clear to auscultation Abdomen: soft, nontender, normal bowel sounds   Lab Results: CBC Latest Ref Rng & Units 10/21/2019 10/20/2019 10/19/2019  WBC 4.0 - 10.5 K/uL 17.7(H) 17.7(H) 9.3  Hemoglobin 12.0 - 15.0 g/dL 10.6(L) 11.5(L) 11.2(L)  Hematocrit 36.0 - 46.0 % 33.0(L) 34.5(L) 33.4(L)  Platelets 150 - 400 K/uL 461(H) 506(H) 459(H)   CMP Latest Ref Rng & Units 10/21/2019 10/20/2019 10/19/2019  Glucose 70 - 99 mg/dL 119(H) 165(H) 155(H)  BUN 8 - 23 mg/dL 12 9 11   Creatinine 0.44 - 1.00 mg/dL 0.38(L) 0.52 0.59  Sodium 135 - 145 mmol/L 139 138 139  Potassium 3.5 - 5.1 mmol/L 4.0 3.3(L) 3.0(L)  Chloride 98 - 111 mmol/L 109 104 104  CO2 22 - 32 mmol/L 18(L) 22 22  Calcium 8.9 - 10.3 mg/dL 8.4(L) 8.6(L) 8.7(L)  Total Protein 6.5 - 8.1 g/dL - - 7.1  Total Bilirubin 0.3 - 1.2 mg/dL - - 0.7  Alkaline Phos 38 - 126 U/L - - 64  AST 15 - 41 U/L - - 23  ALT 0 - 44 U/L - - 13    Micro Results: Recent Results (from the past 240  hour(s))  SARS CORONAVIRUS 2 (TAT 6-24 HRS) Nasopharyngeal Nasopharyngeal Swab     Status: None   Collection Time: 10/20/19  1:43 AM   Specimen: Nasopharyngeal Swab  Result Value Ref Range Status   SARS Coronavirus 2 NEGATIVE NEGATIVE Final    Comment: (NOTE) SARS-CoV-2 target nucleic acids are NOT DETECTED. The SARS-CoV-2 RNA is generally detectable in upper and lower respiratory specimens during the acute phase of infection. Negative results do not preclude SARS-CoV-2 infection, do not rule out co-infections with other pathogens, and should not be used as the sole basis for treatment or other patient management decisions. Negative results must be combined with clinical observations, patient history, and epidemiological information. The expected result is Negative. Fact Sheet for Patients: SugarRoll.be Fact Sheet for Healthcare Providers: https://www.woods-mathews.com/ This test is not yet approved or cleared by the [place] FDA and  has been authorized for detection and/or diagnosis of SARS-CoV-2 by FDA under an Emergency Use Authorization (EUA). This EUA will remain  in effect (meaning this test can be used) for the duration of the COVID-19 declaration under Section 56 4(b)(1) of the Act, 21 U.S.C. section 360bbb-3(b)(1), unless the authorization is terminated or revoked sooner.  Performed at [facility], [place] [address], [place], [place] [address]   CULTURE, BLOOD (ROUTINE X 2) w Reflex to ID Panel     Status: None (Preliminary result)   Collection Time: 10/20/19  9:40 AM   Specimen: BLOOD  Result Value Ref Range Status   Specimen Description BLOOD RIGHT ANTECUBITAL  Final   Special Requests   Final    BOTTLES DRAWN AEROBIC AND ANAEROBIC Blood Culture results may not be optimal due to an inadequate volume of blood received in culture bottles   Culture   Final    NO GROWTH < 24 HOURS Performed at [facility], [address] [place], [place] [address]    Report Status PENDING  Incomplete  CULTURE, BLOOD (ROUTINE X 2) w Reflex to ID Panel     Status: None (Preliminary result)   Collection Time: 10/20/19  9:40 AM   Specimen: BLOOD  Result Value Ref Range Status   Specimen Description BLOOD BLOOD RIGHT FOREARM  Final   Special Requests   Final    BOTTLES DRAWN AEROBIC AND ANAEROBIC Blood Culture adequate volume   Culture   Final    NO GROWTH < 24 HOURS Performed at [facility], [address], [place], [place] [address]    Report Status PENDING  Incomplete   Studies/Results: Ct Head Wo Contrast  Result Date: 10/19/2019 CLINICAL DATA:  Unexplained altered level of consciousness. Vomiting. EXAM: CT HEAD WITHOUT CONTRAST TECHNIQUE: Contiguous axial images were obtained from the base of the skull through the vertex without intravenous contrast. COMPARISON:  MRI 07/26/2019 FINDINGS: Brain: No sign of acute infarction by CT. No brainstem or cerebellar abnormality is seen. Old left occipital infarction. Old right frontal infarction which was acute in August of this year. Small old left frontal cortical infarction. Chronic small-vessel ischemic changes of the white matter as seen previously. No mass lesion, hemorrhage, hydrocephalus or extra-axial collection. Vascular: There is atherosclerotic calcification of the major vessels at the base of the brain. Skull: Negative Sinuses/Orbits: Clear/normal Other: None IMPRESSION: No acute finding by CT. Atrophy and chronic small-vessel ischemic changes. 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 Small old left frontal infarction as well. Electronically Signed   By: Nelson Chimes M.D.   On: 10/19/2019 18:02   Ct Abdomen Pelvis W Contrast  Result Date: 10/19/2019 CLINICAL DATA:  Abdomen distension with vomiting EXAM: CT ABDOMEN AND PELVIS WITH CONTRAST TECHNIQUE: Multidetector CT imaging of the abdomen and pelvis was performed using the standard protocol  following bolus administration of intravenous contrast. CONTRAST:  50mL OMNIPAQUE IOHEXOL 300 MG/ML  SOLN COMPARISON:  CT lumbar spine 10/15/2018 FINDINGS: Lower chest: Small right greater than left pleural effusions at the lung bases. Partial consolidation and adjacent ground-glass density in the right greater than left lower lobes. Heart size within normal limits. Coronary vascular calcification. Moderate hiatal hernia. Hepatobiliary: Status post cholecystectomy. Mild intra hepatic and moderate extrahepatic biliary dilatation with common bile duct measuring up to 15 mm. Pancreas: Unremarkable. No pancreatic ductal dilatation or surrounding inflammatory changes. Spleen: Normal in size without focal abnormality. Adrenals/Urinary Tract: Adrenal glands are normal. Cysts in the right kidney. Mild bilateral hydronephrosis and prominence of the ureters. Very distended urinary bladder Stomach/Bowel: Stomach is within normal limits. Large amount of stool in the colon and rectum. Sigmoid colon diverticular disease without acute inflammatory change no evidence of bowel wall thickening, distention, or inflammatory changes. Vascular/Lymphatic: Moderate aortic atherosclerosis without aneurysm. No significantly enlarged lymph nodes Reproductive:  Status post hysterectomy. No adnexal masses. Other: Negative for free air or free fluid. Musculoskeletal: No acute osseous abnormality. Prior bilateral sacral plasty. Vertebral augmentation L1 through L5 with chronic appearing fracture deformities. No definite acute osseous abnormality IMPRESSION: 1. No CT evidence for acute intra-abdominal or pelvic abnormality. 2. Small right greater than left pleural effusions with partial consolidations and adjacent ground-glass density in the lower lobes either reflecting atelectasis or pneumonia 3. Status post cholecystectomy. Intra and extrahepatic biliary dilatation with common duct measuring up to 15 mm. Recommend correlation with LFTs with  follow-up MRCP if clinically indicated 4. Slight hydronephrosis bilaterally, which may be secondary to urinary bladder distention 5. Sigmoid colon diverticular disease without acute inflammatory process. Large volume of stool in the colon and rectum Electronically Signed   By: Donavan Foil M.D.   On: 10/19/2019 18:11   Mr 3d Recon At Scanner  Result Date: 10/20/2019 CLINICAL DATA:  Unintentional weight loss. Nonlocalized abdominal pain. Biliary dilatation on abdominal CT. EXAM: MRI ABDOMEN WITHOUT AND WITH CONTRAST (INCLUDING MRCP) TECHNIQUE: Multiplanar multisequence MR imaging of the abdomen was performed both before and after the administration of intravenous contrast. Heavily T2-weighted images of the biliary and pancreatic ducts were obtained, and three-dimensional MRCP images were rendered by post processing. CONTRAST:  15mL GADAVIST GADOBUTROL 1 MMOL/ML IV SOLN COMPARISON:  Abdominopelvic CT 10/19/2019. Lumbar spine CT 10/15/2018. FINDINGS: Despite efforts by the technologist and patient, mild motion artifact is present on today's exam and could not be eliminated. This reduces exam sensitivity and specificity. Several sequences were repeated. Lower chest: There are dependent bilateral pleural effusions with associated atelectasis at both lung bases. Hepatobiliary: The liver is normal in signal without focal abnormality or abnormal enhancement. Patient is status post cholecystectomy. As seen on recent CT, there is moderate intrahepatic and extrahepatic biliary dilatation. The common hepatic duct measures up to 13 mm in diameter. Although detail is mildly limited by the motion, there is no evidence of choledocholithiasis. The biliary dilatation appears similar to the prior lumbar spine CT and is likely physiologic in this patient without elevated liver function studies on testing done yesterday. Pancreas: The pancreatic duct is mildly dilated to 4 mm. No evidence of pancreatic mass, pancreas divisum or  surrounding inflammation. Spleen: Normal in size without focal abnormality. Adrenals/Urinary Tract: Both adrenal glands appear normal. There are small bilateral renal cysts. The collecting system dilatation seen on yesterday's CT is improved, likely due to improved bladder distention. The bladder remains mildly distended on the coronal images. Stomach/Bowel: No evidence of bowel wall thickening, distention or surrounding inflammatory change. Vascular/Lymphatic: There are no enlarged abdominal lymph nodes. Diffuse aortic and branch vessel atherosclerosis. Other: No ascites or focal extraluminal fluid collection. Musculoskeletal: Grossly stable compression deformities throughout the lumbar spine status post multilevel spinal augmentation. There is T2 hyperintensity within the sacral ala bilaterally status post sacroplasty. There is a left-sided disc protrusion at T10-11 which causes mild cord flattening, calcified on CT. IMPRESSION: 1. Moderate biliary dilatation status post cholecystectomy. No evidence of choledocholithiasis. Given similar appearance on previous lumbar CT and normal liver function studies, this is probably physiologic. 2. Mild pancreatic ductal dilatation without evidence of pancreatic mass or pancreas divisum. 3. Bilateral pleural effusions with associated atelectasis at both lung bases. 4. Calcified left-sided disc protrusion at T10-11. Electronically Signed   By: Richardean Sale M.D.   On: 10/20/2019 13:55   Dg Chest Port 1 View  Result Date: 10/21/2019 CLINICAL DATA:  Aspiration. EXAM: PORTABLE CHEST 1 VIEW  COMPARISON:  05/02/2014 FINDINGS: 0913 hours. Cardiopericardial silhouette is at upper limits of normal for size. There is pulmonary vascular congestion without overt pulmonary edema. Interstitial markings are diffusely coarsened with chronic features. Bibasilar atelectasis/infiltrate with tiny left pleural effusion. Bones are diffusely demineralized. Telemetry leads overlie the chest.  IMPRESSION: Bibasilar atelectasis/infiltrate with small left pleural effusion. Electronically Signed   By: Misty Stanley M.D.   On: 10/21/2019 11:15   Mr Abdomen Mrcp Moise Boring Contast  Result Date: 10/20/2019 CLINICAL DATA:  Unintentional weight loss. Nonlocalized abdominal pain. Biliary dilatation on abdominal CT. EXAM: MRI ABDOMEN WITHOUT AND WITH CONTRAST (INCLUDING MRCP) TECHNIQUE: Multiplanar multisequence MR imaging of the abdomen was performed both before and after the administration of intravenous contrast. Heavily T2-weighted images of the biliary and pancreatic ducts were obtained, and three-dimensional MRCP images were rendered by post processing. CONTRAST:  32mL GADAVIST GADOBUTROL 1 MMOL/ML IV SOLN COMPARISON:  Abdominopelvic CT 10/19/2019. Lumbar spine CT 10/15/2018. FINDINGS: Despite efforts by the technologist and patient, mild motion artifact is present on today's exam and could not be eliminated. This reduces exam sensitivity and specificity. Several sequences were repeated. Lower chest: There are dependent bilateral pleural effusions with associated atelectasis at both lung bases. Hepatobiliary: The liver is normal in signal without focal abnormality or abnormal enhancement. Patient is status post cholecystectomy. As seen on recent CT, there is moderate intrahepatic and extrahepatic biliary dilatation. The common hepatic duct measures up to 13 mm in diameter. Although detail is mildly limited by the motion, there is no evidence of choledocholithiasis. The biliary dilatation appears similar to the prior lumbar spine CT and is likely physiologic in this patient without elevated liver function studies on testing done yesterday. Pancreas: The pancreatic duct is mildly dilated to 4 mm. No evidence of pancreatic mass, pancreas divisum or surrounding inflammation. Spleen: Normal in size without focal abnormality. Adrenals/Urinary Tract: Both adrenal glands appear normal. There are small bilateral renal  cysts. The collecting system dilatation seen on yesterday's CT is improved, likely due to improved bladder distention. The bladder remains mildly distended on the coronal images. Stomach/Bowel: No evidence of bowel wall thickening, distention or surrounding inflammatory change. Vascular/Lymphatic: There are no enlarged abdominal lymph nodes. Diffuse aortic and branch vessel atherosclerosis. Other: No ascites or focal extraluminal fluid collection. Musculoskeletal: Grossly stable compression deformities throughout the lumbar spine status post multilevel spinal augmentation. There is T2 hyperintensity within the sacral ala bilaterally status post sacroplasty. There is a left-sided disc protrusion at T10-11 which causes mild cord flattening, calcified on CT. IMPRESSION: 1. Moderate biliary dilatation status post cholecystectomy. No evidence of choledocholithiasis. Given similar appearance on previous lumbar CT and normal liver function studies, this is probably physiologic. 2. Mild pancreatic ductal dilatation without evidence of pancreatic mass or pancreas divisum. 3. Bilateral pleural effusions with associated atelectasis at both lung bases. 4. Calcified left-sided disc protrusion at T10-11. Electronically Signed   By: Richardean Sale M.D.   On: 10/20/2019 13:55   Medications:  I have reviewed the patient's current medications. Prior to Admission:  Medications Prior to Admission  Medication Sig Dispense Refill Last Dose  . aspirin EC 81 MG tablet Take 81 mg by mouth daily.   10/19/2019 at Unknown time  . Cholecalciferol (VITAMIN D3) 50 MCG (2000 UT) capsule Take 2,000 Units by mouth daily.    10/19/2019 at Unknown time  . citalopram (CELEXA) 20 MG tablet Take 20 mg by mouth daily.    10/19/2019 at Unknown time  . ibuprofen (ADVIL)  600 MG tablet Take 600 mg by mouth every 6 (six) hours as needed.   10/19/2019 at Unknown time  . lisinopril (ZESTRIL) 5 MG tablet Take 1 tablet (5 mg total) by mouth daily.    10/19/2019 at Unknown time  . lovastatin (MEVACOR) 20 MG tablet Take 20 mg by mouth at bedtime.   Past Week at Unknown time  . memantine (NAMENDA) 10 MG tablet Take 10 mg by mouth 2 (two) times daily.    10/19/2019 at Unknown time  . QUEtiapine (SEROQUEL) 25 MG tablet Take 50 mg by mouth 2 (two) times daily. 25 mg qam  50 mg qhs   10/19/2019 at Unknown time  . traZODone (DESYREL) 50 MG tablet Take 50 mg by mouth at bedtime.   Past Week at Unknown time  . vitamin B-12 (CYANOCOBALAMIN) 1000 MCG tablet Take 1,000 mcg by mouth daily.   10/19/2019 at Unknown time  . acetaminophen (TYLENOL) 500 MG tablet Take 500 mg by mouth 2 (two) times daily.    Not Taking at Unknown time  . aspirin EC 325 MG EC tablet Take 1 tablet (325 mg total) by mouth daily. (Patient not taking: Reported on 10/20/2019) 30 tablet 0 Not Taking at Unknown time  . atorvastatin (LIPITOR) 40 MG tablet Take 1 tablet (40 mg total) by mouth daily at 6 PM. (Patient not taking: Reported on 10/20/2019)   Not Taking at Unknown time  . docusate sodium (COLACE) 50 MG capsule Take 50 mg by mouth 2 (two) times daily as needed for mild constipation.   Not Taking at Unknown time  . Magnesium 100 MG CAPS Take 100 mg by mouth daily.    Not Taking at Unknown time  . sulfamethoxazole-trimethoprim (BACTRIM DS) 800-160 MG tablet Take 1 tablet by mouth every 12 (twelve) hours. (Patient not taking: Reported on 10/20/2019) 8 tablet 0 Not Taking at Unknown time   Scheduled: . aspirin  81 mg Oral Daily  . bisacodyl  10 mg Rectal Daily  . Chlorhexidine Gluconate Cloth  6 each Topical Daily  . cholecalciferol  2,000 Units Oral Daily  . citalopram  20 mg Oral Daily  . guaiFENesin  10 mL Oral Q4H while awake  . heparin injection (subcutaneous)  5,000 Units Subcutaneous Q8H  . lisinopril  5 mg Oral Daily  . magnesium oxide  200 mg Oral Daily  . memantine  10 mg Oral BID  . pravastatin  20 mg Oral Daily  . QUEtiapine  25 mg Oral q morning - 10a   And  .  QUEtiapine  50 mg Oral QHS  . sodium chloride flush  3 mL Intravenous Once  . traZODone  50 mg Oral QHS  . vitamin B-12  1,000 mcg Oral Daily   Continuous: . sodium chloride Stopped (10/21/19 1403)  . ampicillin-sulbactam (UNASYN) IV    . vancomycin 166.7 mL/hr at 10/21/19 1500   [id number] **OR** acetaminophen, hydrALAZINE, magnesium hydroxide, [DISCONTINUED] ondansetron **OR** ondansetron (ZOFRAN) IV, sodium phosphate Anti-infectives (From admission, onward)   Start     Dose/Rate Route Frequency Ordered Stop   10/21/19 1800  Ampicillin-Sulbactam (UNASYN) 3 g in sodium chloride 0.9 % 100 mL IVPB     3 g 200 mL/hr over 30 Minutes Intravenous Every 6 hours 10/21/19 1224     10/21/19 1300  vancomycin (VANCOCIN) 1,250 mg in sodium chloride 0.9 % 250 mL IVPB  Status:  Discontinued     1,250 mg 166.7 mL/hr over 90 Minutes Intravenous Every 48 hours 10/21/19  1223 10/21/19 1227   10/21/19 1300  Vancomycin (VANCOCIN) 1,250 mg in sodium chloride 0.9 % 250 mL IVPB     1,250 mg 166.7 mL/hr over 90 Minutes Intravenous Every 48 hours 10/21/19 1227     10/20/19 0645  Ampicillin-Sulbactam (UNASYN) 3 g in sodium chloride 0.9 % 100 mL IVPB  Status:  Discontinued     3 g 200 mL/hr over 30 Minutes Intravenous Every 6 hours 10/20/19 0639 10/21/19 1153   10/20/19 0645  azithromycin (ZITHROMAX) 500 mg in sodium chloride 0.9 % 250 mL IVPB  Status:  Discontinued     500 mg 250 mL/hr over 60 Minutes Intravenous Every 24 hours 10/20/19 0639 10/21/19 1153     Scheduled Meds: . aspirin  81 mg Oral Daily  . bisacodyl  10 mg Rectal Daily  . Chlorhexidine Gluconate Cloth  6 each Topical Daily  . cholecalciferol  2,000 Units Oral Daily  . citalopram  20 mg Oral Daily  . guaiFENesin  10 mL Oral Q4H while awake  . heparin injection (subcutaneous)  5,000 Units Subcutaneous Q8H  . lisinopril  5 mg Oral Daily  . magnesium oxide  200 mg Oral Daily  . memantine  10 mg Oral BID  . pravastatin  20 mg Oral  Daily  . QUEtiapine  25 mg Oral q morning - 10a   And  . QUEtiapine  50 mg Oral QHS  . sodium chloride flush  3 mL Intravenous Once  . traZODone  50 mg Oral QHS  . vitamin B-12  1,000 mcg Oral Daily   Continuous Infusions: . sodium chloride Stopped (10/21/19 1403)  . ampicillin-sulbactam (UNASYN) IV    . vancomycin 166.7 mL/hr at 10/21/19 1500   PRN Meds:.acetaminophen **OR** acetaminophen, hydrALAZINE, magnesium hydroxide, [DISCONTINUED] ondansetron **OR** ondansetron (ZOFRAN) IV, sodium phosphate   Assessment: Principal Problem:   Intractable nausea and vomiting Active Problems:   Malignant neoplasm of urinary bladder (HCC)   Mixed Alzheimer's and vascular dementia ([place])   Essential (primary) hypertension   HLD (hyperlipidemia)   GERD (gastroesophageal reflux disease)   Aspiration pneumonia (HCC)   Bilateral hydronephrosis   Elevated troponin  Nausea and vomiting likely secondary to severe constipation which is evident on the CT scan No evidence of biliary obstruction or cholangitis.  LFTs normal Persistent leukocytosis, currently on broad-spectrum antibiotics  Plan: Recommend aggressive bowel regimen Dulcolax suppository/Fleet enema and oral MiraLAX daily Patient has advanced dementia, do not expect optimal oral intake until infection and constipation are improving    LOS: 1 day   Rohini Vanga 10/21/2019, 3:50 PM

## 2019-10-21 NOTE — Progress Notes (Addendum)
PROGRESS NOTE    Gabrielle Crosby  [id number] DOB: [birth year] DOA: 10/19/2019 PCP: Toni Arthurs, NP    Brief Narrative:   Gabrielle Crosby  is a 85 y.o. Caucasian female with a known history of multiple medical problems that will be mentioned below, presented to the emergency room with acute onset of bilious vomitus as well as constipation with stool impaction which have been going on over the last couple days.  She denied any fever or chills.  She has been having abdominal discomfort with constipation.  No chest pain or dyspnea or palpitations.  No melena or bright red blood per rectum or hematemesis or jaundice.  No dysuria, oliguria or hematuria or flank pain.  Upon presentation to the emergency room, blood pressure was 155/71 and later 160/58 with otherwise normal vital signs.  Labs revealed hypokalemia of 3 and magnesium level came back 1.8 with lipase levels 24 and mild anemia.  High-sensitivity troponin I was 15.  Urinalysis showed 130 glucose and 20 ketones.  Abdominal pelvic CT scan showed her cholecystectomy and intrahepatic and extrahepatic biliary dilatation with common duct measuring up to 15 mm with recommendation for MRCP, slight bilateral hydronephrosis that may be due to the urinary bladder distention and sigmoid diverticulosis with large volume of stool in the colon and rectum and small right greater than left pleural effusions with partial consolidation and adjacent groundglass density in the lower lobes reflecting atelectasis or pneumonia with no acute abdominal or pelvic abnormalities.  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  EKG showed normal sinus rhythm with a rate of 81 with left atrial enlargement.  The patient was given 1 L bolus of IV normal saline as well as 4 mg of IV Zofran twice and 10 leucovorin IV potassium chloride.  She was  disimpacted by the ER physician.  She will be admitted to medical monitored bed for further evaluation and management.   Interim History: -11/20: MRCP  1. Moderate biliary dilatation status post cholecystectomy. No evidence of choledocholithiasis. Given similar appearance on  previous lumbar CT and normal liver function studies, this is probably physiologic.  2. Mild pancreatic ductal dilatation without evidence of pancreatic mass or pancreas divisum.  3. Bilateral pleural effusions with associated atelectasis at both lung bases.  4. Calcified left-sided disc protrusion at T10-11.  -GI consulted, due to unremarkable MRCP findings, recommended laxative treatment  -11/21: WBC no improvement, 17.7 -->17.7. will change Abx from Unasyn and azithromycin to vancomycin and unasyn   Subjective: Pt has dementia, only gives limited information.  Patient reports some abdominal pain, no active nausea vomiting, diarrhea noted.  Does not seem to have chest pain.  Assessment & Plan:   Principal Problem:   Intractable nausea and vomiting Active Problems:   Malignant neoplasm of urinary bladder (HCC)   Mixed Alzheimer's and vascular dementia ([place])   Essential (primary) hypertension   HLD (hyperlipidemia)   GERD (gastroesophageal reflux disease)   Aspiration pneumonia (HCC)   Bilateral hydronephrosis   Elevated troponin  Intractable nausea and vomiting and common bile duct dilatation with intra and extrahepatic biliary dilatation:  pt has constipation and fecal impaction. CT scan showed cholecystectomy and intrahepatic and extrahepatic biliary dilatation with common duct measuring up to 15 mm with recommendation for MRCP which is done 11/20. It showed dilated common bile duct that was also seen on her CT scan.  The radiologist had reported that the dilated not changed from the prior imaging.   GI, Dr. Allen Norris was consulted and highly appreciated his consultation. He recommended "The patient should  be treated with laxatives to continue to help her clear out her bowels in case this is one of the reasons she is having her nausea and vomiting.  If the patient continues to have nausea and vomiting she may need an upper endoscopy to evaluate her esophagus and stomach to make sure she does not have an obstructive lesion".  -will continue hydration with IV normal saline. - Give laxatives  - prn zofran  Suspected aspiration pneumonia with bilateral pleural effusion: Leukocytosis not improved - f/u Bx - IV Unasyn and Zithromax --> will change to vancomycin and unasyn 11/21 - sputum Gram stain culture - SLP  CBC Latest Ref Rng & Units 10/21/2019 10/20/2019 10/19/2019  WBC 4.0 - 10.5 K/uL 17.7(H) 17.7(H) 9.3  Hemoglobin 12.0 - 15.0 g/dL 10.6(L) 11.5(L) 11.2(L)  Hematocrit 36.0 - 46.0 % 33.0(L) 34.5(L) 33.4(L)  Platelets 150 - 400 K/uL 461(H) 506(H) 459(H)   Hypokalemia: k 4.0  on11/21  Depression.   -Celexa.  Uncontrolled hypertension.   -We will continue Zestril and place the patient on as needed IV labetalol.  Dementia with behavioral changes.   -Continue Namenda and Seroquel.  Dyslipidemia.   -Statin  Elevated trop: trop 15 -->24 --> 70 --> 80. No CP, possible demand ischemia. -ASA and statin   bilateral hydronephrosis and hx of malignant neoplasm of urinary bladder: CT scan showed slight bilateral hydronephrosis that may be due to the urinary bladder distention and sigmoid diverticulosis with large volume of stool in the colon and rectum. Cre normal 0.52 and BUN 9 -will place Foley in   DVT prophylaxis: sq Heparin Code Status: DNR per her daughter 11/21 Family Communication: daughter by phone Disposition Plan: Not ready for discharge.  Pt has multiple complicated presentations.  Leukocytosis has not improved.  Consultants:  GI  Procedures:  Pending MRCP  Antimicrobials: Anti-infectives (From admission, onward)   Start     Dose/Rate Route Frequency Ordered Stop    10/20/19 0645  Ampicillin-Sulbactam (UNASYN) 3 g in sodium chloride 0.9 % 100 mL IVPB     3 g 200 mL/hr over 30 Minutes Intravenous Every 6 hours 10/20/19 0639     10/20/19 0645  azithromycin (ZITHROMAX) 500 mg in sodium chloride 0.9 % 250 mL IVPB     500 mg 250 mL/hr over 60 Minutes Intravenous Every 24 hours 10/20/19 0639            Objective: Vitals:   10/20/19 1712 10/20/19 1747 10/20/19 2137 10/21/19 0524  BP: (!) 164/74 (!) 170/84 (!) 168/87 (!) 149/89  Pulse: 95 98 (!) 105 97  Resp: 20 18 18 20   Temp: 99 F (37.2 C) 99.5 F (37.5 C) 99 F (37.2 C) 98.4 F (36.9 C)  TempSrc: Axillary Axillary Oral Oral  SpO2: 95% 94% 95% 96%  Weight:      Height:        Intake/Output Summary (Last 24 hours) at 10/21/2019 0847 Last data filed at 10/21/2019 [id number] Gross per 24 hour  Intake 2036.99 ml  Output 900 ml  Net 1136.99 ml   Filed Weights   10/19/19 1520  Weight: 46 kg    Examination:  Physical Exam:  General: Not in acute distress HEENT: PERRL, EOMI, no scleral icterus, No JVD or bruit Cardiac: S1/S2, RRR, No murmurs, gallops or rubs Pulm: has rhonchi  bilaterally.  Abd: Soft, nondistended, has tenderness in epigastric area, no rebound pain, no organomegaly, BS present Ext: No edema. 2+DP/PT pulse bilaterally Musculoskeletal: No joint deformities, erythema, or stiffness, ROM full Skin: No rashes.  Neuro: Alert, cranial nerves II-XII grossly intact, moves all extremeties. Psych: Patient is not psychotic, no suicidal or hemocidal ideation.    Data Reviewed: I have personally reviewed following labs and imaging studies  CBC: Recent Labs  Lab 10/19/19 1538 10/20/19 0548 10/21/19 0631  WBC 9.3 17.7* 17.7*  HGB 11.2* 11.5* 10.6*  HCT 33.4* 34.5* 33.0*  MCV 91.3 92.7 94.6  PLT 459* 506* [id number]*   Basic Metabolic Panel: Recent Labs  Lab 10/19/19 1538 10/19/19 1716 10/20/19 0548 10/21/19 0631  NA 139  --  138 139  K 3.0*  --  3.3* 4.0  CL 104  --  104  109  CO2 22  --  22 18*  GLUCOSE 155*  --  165* 119*  BUN 11  --  9 12  CREATININE 0.59  --  0.52 0.38*  CALCIUM 8.7*  --  8.6* 8.4*  MG  --  1.8  --   --    GFR: Estimated Creatinine Clearance: 38.3 mL/min (A) (by C-G formula based on SCr of 0.38 mg/dL (L)). Liver Function Tests: Recent Labs  Lab 10/19/19 1538  AST 23  ALT 13  ALKPHOS 64  BILITOT 0.7  PROT 7.1  ALBUMIN 3.6   Recent Labs  Lab 10/19/19 1538  LIPASE 24   No results for input(s): AMMONIA in the last 168 hours. Coagulation Profile: No results for input(s): INR, PROTIME in the last 168 hours. Cardiac Enzymes: No results for input(s): CKTOTAL, CKMB, CKMBINDEX, TROPONINI in the last 168 hours. BNP (last 3 results) No results for input(s): PROBNP in the last 8760 hours. HbA1C: No results for input(s): HGBA1C in the last 72 hours. CBG: No results for input(s): GLUCAP in the last 168 hours. Lipid Profile: No results for input(s): CHOL, HDL, LDLCALC, TRIG, CHOLHDL, LDLDIRECT in the last 72 hours. Thyroid Function Tests: No results for input(s): TSH, T4TOTAL, FREET4, T3FREE, THYROIDAB in the last 72 hours. Anemia Panel: No results for input(s): VITAMINB12, FOLATE, FERRITIN, TIBC, IRON, RETICCTPCT in the last 72 hours. Sepsis Labs: No results for input(s): PROCALCITON, LATICACIDVEN in the last 168 hours.  Recent Results (from the past 240 hour(s))  SARS CORONAVIRUS 2 (TAT 6-24 HRS) Nasopharyngeal Nasopharyngeal Swab     Status: None   Collection Time: 10/20/19  1:43 AM   Specimen: Nasopharyngeal Swab  Result Value Ref Range Status   SARS Coronavirus 2 NEGATIVE NEGATIVE Final    Comment: (NOTE) SARS-CoV-2 target nucleic acids are NOT DETECTED. The SARS-CoV-2 RNA is generally detectable in upper and lower respiratory specimens during the acute phase of infection. Negative results do not preclude SARS-CoV-2 infection, do not rule out co-infections with other pathogens, and should not be used as the sole  basis for treatment or other patient management decisions. Negative results must be combined with clinical observations, patient history, and epidemiological information. The expected result is Negative. Fact Sheet for Patients: SugarRoll.be Fact Sheet for Healthcare Providers: https://www.woods-mathews.com/ This test is not yet approved or cleared by the [place] FDA and  has been authorized for detection and/or diagnosis of SARS-CoV-2 by FDA under an Emergency Use Authorization (EUA). This EUA will remain  in effect (meaning this test can be used) for the duration of the COVID-19 declaration under Section 56 4(b)(1) of the Act, 21  U.S.C. section 360bbb-3(b)(1), unless the authorization is terminated or revoked sooner. Performed at [facility], [place] [address], [place], [place] [address]   CULTURE, BLOOD (ROUTINE X 2) w Reflex to ID Panel     Status: None (Preliminary result)   Collection Time: 10/20/19  9:40 AM   Specimen: BLOOD  Result Value Ref Range Status   Specimen Description BLOOD RIGHT ANTECUBITAL  Final   Special Requests   Final    BOTTLES DRAWN AEROBIC AND ANAEROBIC Blood Culture results may not be optimal due to an inadequate volume of blood received in culture bottles   Culture   Final    NO GROWTH < 24 HOURS Performed at [facility], [address] [place], [place] [address]    Report Status PENDING  Incomplete  CULTURE, BLOOD (ROUTINE X 2) w Reflex to ID Panel     Status: None (Preliminary result)   Collection Time: 10/20/19  9:40 AM   Specimen: BLOOD  Result Value Ref Range Status   Specimen Description BLOOD BLOOD RIGHT FOREARM  Final   Special Requests   Final    BOTTLES DRAWN AEROBIC AND ANAEROBIC Blood Culture adequate volume   Culture   Final    NO GROWTH < 24 HOURS Performed at [facility], [address], [place], [place] [address]    Report Status PENDING  Incomplete     RN  Pressure Injury Documentation:       Radiology Studies: Ct Head Wo Contrast  Result Date: 10/19/2019 CLINICAL DATA:  Unexplained altered level of consciousness. Vomiting. EXAM: CT HEAD WITHOUT CONTRAST TECHNIQUE: Contiguous axial images were obtained from the base of the skull through the vertex without intravenous contrast. COMPARISON:  MRI 07/26/2019 FINDINGS: Brain: No sign of acute infarction by CT. No brainstem or cerebellar abnormality is seen. Old left occipital infarction. Old right frontal infarction which was acute in August of this year. Small old left frontal cortical infarction. Chronic small-vessel ischemic changes of the white matter as seen previously. No mass lesion, hemorrhage, hydrocephalus or extra-axial collection. Vascular: There is atherosclerotic calcification of the major vessels at the base of the brain. Skull: Negative Sinuses/Orbits: Clear/normal Other: None IMPRESSION: No acute finding by CT. Atrophy and chronic small-vessel ischemic changes. 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 Small old left frontal infarction as well. Electronically Signed   By: Nelson Chimes M.D.   On: 10/19/2019 18:02   Ct Abdomen Pelvis W Contrast  Result Date: 10/19/2019 CLINICAL DATA:  Abdomen distension with vomiting EXAM: CT ABDOMEN AND PELVIS WITH CONTRAST TECHNIQUE: Multidetector CT imaging of the abdomen and pelvis was performed using the standard protocol following bolus administration of intravenous contrast. CONTRAST:  79mL OMNIPAQUE IOHEXOL 300 MG/ML  SOLN COMPARISON:  CT lumbar spine 10/15/2018 FINDINGS: Lower chest: Small right greater than left pleural effusions at the lung bases. Partial consolidation and adjacent ground-glass density in the right greater than left lower lobes. Heart size within normal limits. Coronary vascular calcification. Moderate hiatal hernia. Hepatobiliary: Status post cholecystectomy. Mild intra hepatic and moderate extrahepatic biliary dilatation  with common bile duct measuring up to 15 mm. Pancreas: Unremarkable. No pancreatic ductal dilatation or surrounding inflammatory changes. Spleen: Normal in size without focal abnormality. Adrenals/Urinary Tract: Adrenal glands are normal. Cysts in the right kidney. Mild bilateral hydronephrosis and prominence of the ureters. Very distended urinary bladder Stomach/Bowel: Stomach is within normal limits. Large amount of stool in the colon and rectum. Sigmoid colon diverticular disease without acute  inflammatory change no evidence of bowel wall thickening, distention, or inflammatory changes. Vascular/Lymphatic: Moderate aortic atherosclerosis without aneurysm. No significantly enlarged lymph nodes Reproductive: Status post hysterectomy. No adnexal masses. Other: Negative for free air or free fluid. Musculoskeletal: No acute osseous abnormality. Prior bilateral sacral plasty. Vertebral augmentation L1 through L5 with chronic appearing fracture deformities. No definite acute osseous abnormality IMPRESSION: 1. No CT evidence for acute intra-abdominal or pelvic abnormality. 2. Small right greater than left pleural effusions with partial consolidations and adjacent ground-glass density in the lower lobes either reflecting atelectasis or pneumonia 3. Status post cholecystectomy. Intra and extrahepatic biliary dilatation with common duct measuring up to 15 mm. Recommend correlation with LFTs with follow-up MRCP if clinically indicated 4. Slight hydronephrosis bilaterally, which may be secondary to urinary bladder distention 5. Sigmoid colon diverticular disease without acute inflammatory process. Large volume of stool in the colon and rectum Electronically Signed   By: Donavan Foil M.D.   On: 10/19/2019 18:11   Mr 3d Recon At Scanner  Result Date: 10/20/2019 CLINICAL DATA:  Unintentional weight loss. Nonlocalized abdominal pain. Biliary dilatation on abdominal CT. EXAM: MRI ABDOMEN WITHOUT AND WITH CONTRAST (INCLUDING  MRCP) TECHNIQUE: Multiplanar multisequence MR imaging of the abdomen was performed both before and after the administration of intravenous contrast. Heavily T2-weighted images of the biliary and pancreatic ducts were obtained, and three-dimensional MRCP images were rendered by post processing. CONTRAST:  74mL GADAVIST GADOBUTROL 1 MMOL/ML IV SOLN COMPARISON:  Abdominopelvic CT 10/19/2019. Lumbar spine CT 10/15/2018. FINDINGS: Despite efforts by the technologist and patient, mild motion artifact is present on today's exam and could not be eliminated. This reduces exam sensitivity and specificity. Several sequences were repeated. Lower chest: There are dependent bilateral pleural effusions with associated atelectasis at both lung bases. Hepatobiliary: The liver is normal in signal without focal abnormality or abnormal enhancement. Patient is status post cholecystectomy. As seen on recent CT, there is moderate intrahepatic and extrahepatic biliary dilatation. The common hepatic duct measures up to 13 mm in diameter. Although detail is mildly limited by the motion, there is no evidence of choledocholithiasis. The biliary dilatation appears similar to the prior lumbar spine CT and is likely physiologic in this patient without elevated liver function studies on testing done yesterday. Pancreas: The pancreatic duct is mildly dilated to 4 mm. No evidence of pancreatic mass, pancreas divisum or surrounding inflammation. Spleen: Normal in size without focal abnormality. Adrenals/Urinary Tract: Both adrenal glands appear normal. There are small bilateral renal cysts. The collecting system dilatation seen on yesterday's CT is improved, likely due to improved bladder distention. The bladder remains mildly distended on the coronal images. Stomach/Bowel: No evidence of bowel wall thickening, distention or surrounding inflammatory change. Vascular/Lymphatic: There are no enlarged abdominal lymph nodes. Diffuse aortic and branch  vessel atherosclerosis. Other: No ascites or focal extraluminal fluid collection. Musculoskeletal: Grossly stable compression deformities throughout the lumbar spine status post multilevel spinal augmentation. There is T2 hyperintensity within the sacral ala bilaterally status post sacroplasty. There is a left-sided disc protrusion at T10-11 which causes mild cord flattening, calcified on CT. IMPRESSION: 1. Moderate biliary dilatation status post cholecystectomy. No evidence of choledocholithiasis. Given similar appearance on previous lumbar CT and normal liver function studies, this is probably physiologic. 2. Mild pancreatic ductal dilatation without evidence of pancreatic mass or pancreas divisum. 3. Bilateral pleural effusions with associated atelectasis at both lung bases. 4. Calcified left-sided disc protrusion at T10-11. Electronically Signed   By: Caryl Comes.D.  On: 10/20/2019 13:55   Mr Abdomen Mrcp Moise Boring Contast  Result Date: 10/20/2019 CLINICAL DATA:  Unintentional weight loss. Nonlocalized abdominal pain. Biliary dilatation on abdominal CT. EXAM: MRI ABDOMEN WITHOUT AND WITH CONTRAST (INCLUDING MRCP) TECHNIQUE: Multiplanar multisequence MR imaging of the abdomen was performed both before and after the administration of intravenous contrast. Heavily T2-weighted images of the biliary and pancreatic ducts were obtained, and three-dimensional MRCP images were rendered by post processing. CONTRAST:  86mL GADAVIST GADOBUTROL 1 MMOL/ML IV SOLN COMPARISON:  Abdominopelvic CT 10/19/2019. Lumbar spine CT 10/15/2018. FINDINGS: Despite efforts by the technologist and patient, mild motion artifact is present on today's exam and could not be eliminated. This reduces exam sensitivity and specificity. Several sequences were repeated. Lower chest: There are dependent bilateral pleural effusions with associated atelectasis at both lung bases. Hepatobiliary: The liver is normal in signal without focal  abnormality or abnormal enhancement. Patient is status post cholecystectomy. As seen on recent CT, there is moderate intrahepatic and extrahepatic biliary dilatation. The common hepatic duct measures up to 13 mm in diameter. Although detail is mildly limited by the motion, there is no evidence of choledocholithiasis. The biliary dilatation appears similar to the prior lumbar spine CT and is likely physiologic in this patient without elevated liver function studies on testing done yesterday. Pancreas: The pancreatic duct is mildly dilated to 4 mm. No evidence of pancreatic mass, pancreas divisum or surrounding inflammation. Spleen: Normal in size without focal abnormality. Adrenals/Urinary Tract: Both adrenal glands appear normal. There are small bilateral renal cysts. The collecting system dilatation seen on yesterday's CT is improved, likely due to improved bladder distention. The bladder remains mildly distended on the coronal images. Stomach/Bowel: No evidence of bowel wall thickening, distention or surrounding inflammatory change. Vascular/Lymphatic: There are no enlarged abdominal lymph nodes. Diffuse aortic and branch vessel atherosclerosis. Other: No ascites or focal extraluminal fluid collection. Musculoskeletal: Grossly stable compression deformities throughout the lumbar spine status post multilevel spinal augmentation. There is T2 hyperintensity within the sacral ala bilaterally status post sacroplasty. There is a left-sided disc protrusion at T10-11 which causes mild cord flattening, calcified on CT. IMPRESSION: 1. Moderate biliary dilatation status post cholecystectomy. No evidence of choledocholithiasis. Given similar appearance on previous lumbar CT and normal liver function studies, this is probably physiologic. 2. Mild pancreatic ductal dilatation without evidence of pancreatic mass or pancreas divisum. 3. Bilateral pleural effusions with associated atelectasis at both lung bases. 4. Calcified  left-sided disc protrusion at T10-11. Electronically Signed   By: Richardean Sale M.D.   On: 10/20/2019 13:55        Scheduled Meds: . aspirin EC  81 mg Oral Daily  . Chlorhexidine Gluconate Cloth  6 each Topical Daily  . cholecalciferol  2,000 Units Oral Daily  . citalopram  20 mg Oral Daily  . guaiFENesin  600 mg Oral BID  . heparin injection (subcutaneous)  5,000 Units Subcutaneous Q8H  . lisinopril  5 mg Oral Daily  . magnesium oxide  200 mg Oral Daily  . memantine  10 mg Oral BID  . pravastatin  20 mg Oral Daily  . QUEtiapine  25 mg Oral q morning - 10a   And  . QUEtiapine  50 mg Oral QHS  . sodium chloride flush  3 mL Intravenous Once  . traZODone  50 mg Oral QHS  . vitamin B-12  1,000 mcg Oral Daily   Continuous Infusions: . sodium chloride    . ampicillin-sulbactam (UNASYN) IV 3 g (10/21/19  0600)  . azithromycin Stopped (10/20/19 1550)     LOS: 1 day    Time spent: 30 min     Ivor Costa, DO [facility] PAGER is on [place]  If 7PM-7AM, please contact night-coverage [email or web address] Password [facility] 10/21/2019, 8:47 AM

## 2019-10-21 NOTE — Progress Notes (Signed)
Notified Dr. Blaine Hamper patients BP was 175/81 and I was giving the ordered lisinopril. No new orders given.

## 2019-10-21 NOTE — Evaluation (Signed)
Clinical/Bedside Swallow Evaluation Patient Details  Name: Gabrielle Crosby MRN: [id number] Date of Birth: Aug 19, 1933  Today's Date: 10/21/2019 Time: SLP Start Time (ACUTE ONLY): 43 SLP Stop Time (ACUTE ONLY): 1225 SLP Time Calculation (min) (ACUTE ONLY): 55 min  Past Medical History:  Past Medical History:  Diagnosis Date  . Arthritis    hands  . Bladder cancer ([place])   . Brain bleed ([place]) 2019   d/t light strokes, high bp, seizures. brain has shrunk per dr. Manuella Ghazi and mri  . Cancer ([place])    BLADDER X 2  . Dementia ([place]) 2019   dementia progressing  . Depression   . GERD (gastroesophageal reflux disease)    chokes easily even on own saliva and then has panic attack  . Hyperlipidemia   . Hypertension 2019   no medications.  no htn per daughter  . Seizures [facility])    family not aware of any seizures and no treatment  . Stroke [facility]) 2019   undiagnosed but per neuro, she has had them causing vascular dementia  . Wears dentures    partial upper and lower   Past Surgical History:  Past Surgical History:  Procedure Laterality Date  . ABDOMINAL HYSTERECTOMY Bilateral    BILATERAL s&o  . BACK SURGERY    . BLADDER SURGERY N/A 2015   REMOVAL OF BLADDER CANCER X 2  . BOWEL RESECTION N/A 1999   SMALL BOWEL OBSTRUCTION  . CHOLECYSTECTOMY    . COLONOSCOPY N/A 11/06/2015   Procedure: COLONOSCOPY;  Surgeon: Hulen Luster, MD;  Location: [place];  Service: Gastroenterology;  Laterality: N/A;  . CYSTOSCOPY WITH BIOPSY N/A 04/17/2015   Procedure: CYSTOSCOPY WITH BIOPSY;  Surgeon: Hollice Espy, MD;  Location: [facility];  Service: Urology;  Laterality: N/A;  . KYPHOPLASTY N/A 12/20/2017   Procedure: [id number];  Surgeon: Hessie Knows, MD;  Location: [facility];  Service: Orthopedics;  Laterality: N/A;  . KYPHOPLASTY N/A 04/21/2018   Procedure: KYPHOPLASTY-L2,L3,L4,L5;  Surgeon: Hessie Knows, MD;  Location: [facility];  Service: Orthopedics;  Laterality: N/A;  . SACROPLASTY N/A  11/03/2018   Procedure: Dillard Cannon;  Surgeon: Hessie Knows, MD;  Location: [facility];  Service: Orthopedics;  Laterality: N/A;   HPI:  Pt is a 85 y.o. year old female with multiple medical problems including seizure disorder, IBS, DVT, Alzheimer's with Vascular Dementia, bladder cancer, arthritis, hypertension, hyperlipidemia, GERD, depression, stroke. She has resided at [facility] then transitioned to [place] in recent months per chart notes. She was hospitalized 07/2019 for left facial droop. Workup significant for acute right frontal infarct with positive MRI. She was started on aspirin then. Per chart notes, pt can ambulate with supervision from staff. She does require assistance with all ADLs. She has episodes of incontinence. She is oriented to self but otherwise confused w/ limited verbal output. This has been baseline per chart notes; Palliative Care notes. On this admission, she presented to the emergency room with acute onset of bilious vomitus as well as constipation with stool impaction which have been going on over the last couple days. She was dx'd w/: Intractable nausea and vomiting and common bile duct dilatation with intra and extrahepatic biliary dilatation:  pt has constipation and fecal impaction. CT scan showed cholecystectomy and intrahepatic and extrahepatic biliary dilatation with common duct measuring up to 15 mm with recommendation for MRCP which is done 11/20. It showed dilated common bile duct that was also seen on her CT scan.. Any N/V can increase risk for aspiration of  Regurgitated food/liquid material, and Reflux then increase risk for Pulmonary impact.    Assessment / Plan / Recommendation Clinical Impression  Pt appears to present w/ oropharyngeal phase dysphagia significantly impacted by her (baseline) declined Cognitive status - pt has a baseline of Dementia. She requires Mod+ cues(tactile/verbal stim) and feeding support for follow through w/ po tasks. W/ Mod+ support, pt appears  to be able to consume a modified dysphagia diet w/out overt s/s of aspiration noted. Pt boluses of ice chips, Nectar liquids, and purees without s/s of aspiration -- no overt coughing or decline in respiratory effort during/post trials noted. Oral structures and functioning assessed through bolus management during the oral phase appeared [facility] w/ no oral weakness noted. During the oral phase, pt exhibited adequate bolus management and A-P transfer for swallowing; oral clearing achieved adequate. Pt required tactile/verbal cues w/ each tsp to engage oral prep stage; min oral transit delay noted intermittently. Recommend Dysphagia 1(Puree) diet w/ Nectar consistency liquids; Pills Crushed in Puree; aspiration precautions; feeding support at meals. ST services will monitor for appropriateness for diet upgrade next 2-3 days. Recommend dietician f/u for nutritional support. GI is following d/t N/V and impaction issues. NSG updated. SLP Visit Diagnosis: Dysphagia, oropharyngeal phase (R13.12);Cognitive communication deficit (R41.841)(baseline Dementia)    Aspiration Risk  Mild aspiration risk;Moderate aspiration risk;Risk for inadequate nutrition/hydration    Diet Recommendation  Dysphagive level 1 (puree) w/ Nectar consistency liquids; aspiration precautions; feeding support at all meals. Reduce distractions.   Medication Administration: Crushed with puree    Other  Recommendations Recommended Consults: (Dietician f/u; Palliative care f/u for GOC(as previous)) Oral Care Recommendations: Oral care BID;Oral care before and after PO;Staff/trained caregiver to provide oral care Other Recommendations: Order thickener from pharmacy;Prohibited food (jello, ice cream, thin soups);Remove water pitcher;Have oral suction available   Follow up Recommendations None(TBD)      Frequency and Duration min 3x week  2 weeks       Prognosis Prognosis for Safe Diet Advancement: Fair Barriers to Reach Goals: Cognitive  deficits;Time post onset;Severity of deficits      Swallow Study   General Date of Onset: 10/19/19 HPI: Pt is a 85 y.o. year old female with multiple medical problems including seizure disorder, IBS, DVT, Alzheimer's with Vascular Dementia, bladder cancer, arthritis, hypertension, hyperlipidemia, GERD, depression, stroke. She has resided at [facility] then transitioned to [place] in recent months per chart notes. She was hospitalized 07/2019 for left facial droop. Workup significant for acute right frontal infarct with positive MRI. She was started on aspirin then. Per chart notes, pt can ambulate with supervision from staff. She does require assistance with all ADLs. She has episodes of incontinence. She is oriented to self but otherwise confused w/ limited verbal output. This has been baseline per chart notes; Palliative Care notes. On this admission, she presented to the emergency room with acute onset of bilious vomitus as well as constipation with stool impaction which have been going on over the last couple days. She was dx'd w/: Intractable nausea and vomiting and common bile duct dilatation with intra and extrahepatic biliary dilatation:  pt has constipation and fecal impaction. CT scan showed cholecystectomy and intrahepatic and extrahepatic biliary dilatation with common duct measuring up to 15 mm with recommendation for MRCP which is done 11/20. It showed dilated common bile duct that was also seen on her CT scan.. Any N/V can increase risk for aspiration of Regurgitated food/liquid material, and Reflux then increase risk for Pulmonary impact.  Type  of Study: Bedside Swallow Evaluation Previous Swallow Assessment: 07/2019 Diet Prior to this Study: Dysphagia 1 (puree);Nectar-thick liquids(unsure of current diet at SNF/AL) Temperature Spikes Noted: No(wbc 17.7) Respiratory Status: Room air History of Recent Intubation: No Behavior/Cognition: Cooperative;Pleasant mood;Confused;Distractible;Requires  cueing(Awake but w/ eyes closed; mumbled speech) Oral Cavity Assessment: Dry Oral Care Completed by SLP: Yes Oral Cavity - Dentition: Adequate natural dentition Vision: (n/a) Self-Feeding Abilities: Total assist Patient Positioning: Upright in bed(needed positioning) Baseline Vocal Quality: Normal;Low vocal intensity Volitional Cough: Cognitively unable to elicit Volitional Swallow: Unable to elicit    Oral/Motor/Sensory Function Overall Oral Motor/Sensory Function: Within functional limits(w/ bolus management, speech)   Ice Chips Ice chips: Within functional limits Presentation: Spoon(fed; 3 trials) Other Comments: eyes remained closed; total A w/ feeding   Thin Liquid Thin Liquid: Not tested Other Comments: d/t Cognitive decline and reduced engagement w/ SLP during trials    Nectar Thick Nectar Thick Liquid: Impaired Presentation: Cup;Spoon(fed by SLP; ~2+ozs) Oral Phase Impairments: Poor awareness of bolus Oral phase functional implications: Prolonged oral transit(min) Pharyngeal Phase Impairments: (none) Other Comments: tactile/verbal cues given w/ each tsp to engage oral prep stage   Honey Thick Honey Thick Liquid: Not tested   Puree Puree: Impaired Presentation: Spoon(fed; ~2+ ozs) Oral Phase Impairments: Poor awareness of bolus Oral Phase Functional Implications: Prolonged oral transit(min) Pharyngeal Phase Impairments: (none) Other Comments: tactile/verbal cues given w/ each tsp to engage oral prep stage   Solid     Solid: Not tested       Orinda Kenner, MS, CCC-SLP , 10/21/2019,2:50 PM

## 2019-10-21 NOTE — Progress Notes (Signed)
Pharmacy Antibiotic Note  Gabrielle Crosby is a 85 y.o. female admitted on 10/19/2019. Patient with suspected aspiration pneumonia with leukocytosis not improving on Unasyn. Pharmacy has been consulted for vancomycin dosing. Patient is on day of Unasyn 3g IV Q6hr.   Plan: Vancomycin 1250mg  IV Q48hr. Will obtain serum creatinine every 24-48 hours while patient is on vancomycin. Will obtain peak/troughs as warranted.   Height: 5' (152.4 cm) Weight: 101 lb 6.6 oz (46 kg) IBW/kg (Calculated) : 45.5  Temp (24hrs), Avg:99 F (37.2 C), Min:98.4 F (36.9 C), Max:99.5 F (37.5 C)  Recent Labs  Lab 10/19/19 1538 10/20/19 0548 10/21/19 0631  WBC 9.3 17.7* 17.7*  CREATININE 0.59 0.52 0.38*    Estimated Creatinine Clearance: 38.3 mL/min (A) (by C-G formula based on SCr of 0.38 mg/dL (L)).    Allergies  Allergen Reactions  . Atenolol Other (See Comments)    Bradycardia   . Donepezil Hcl Other (See Comments)    Mental changes, "made me crazy"    Antimicrobials this admission: Azithromycin 11/20 x 1 Unasyn 11/20 >> Vancomycin 11/21 >>   Dose adjustments this admission: N/A  Microbiology results: 11/20 BCx: no growth < 24 hours 11/20 SARS Coronovirus: negative   Thank you for allowing pharmacy to be a part of this patient's care.  Simpson,Michael L 10/21/2019 3:20 PM

## 2019-10-22 LAB — BASIC METABOLIC PANEL
Anion gap: 8 (ref 5–15)
BUN: 19 mg/dL (ref 8–23)
CO2: 22 mmol/L (ref 22–32)
Calcium: 8.2 mg/dL — ABNORMAL LOW (ref 8.9–10.3)
Chloride: 115 mmol/L — ABNORMAL HIGH (ref 98–111)
Creatinine, Ser: 0.55 mg/dL (ref 0.44–1.00)
GFR calc Af Amer: >60 mL/min (ref >60)
GFR calc non Af Amer: >60 mL/min (ref >60)
Glucose, Bld: 96 mg/dL (ref 70–99)
Potassium: 3.2 mmol/L — ABNORMAL LOW (ref 3.5–5.1)
Sodium: 145 mmol/L (ref 135–145)

## 2019-10-22 LAB — CBC
HCT: 27.1 % — ABNORMAL LOW (ref 36.0–46.0)
Hemoglobin: 8.7 g/dL — ABNORMAL LOW (ref 12.0–15.0)
MCH: 30.9 pg (ref 26.0–34.0)
MCHC: 32.1 g/dL (ref 30.0–36.0)
MCV: 96.1 fL (ref 80.0–100.0)
Platelets: 278 10*3/uL (ref 150–400)
RBC: 2.82 MIL/uL — ABNORMAL LOW (ref 3.87–5.11)
RDW: 13.8 % (ref 11.5–15.5)
WBC: 9.7 10*3/uL (ref 4.0–10.5)
nRBC: 0 % (ref 0.0–0.2)

## 2019-10-22 LAB — MAGNESIUM: Magnesium: 2.1 mg/dL (ref 1.7–2.4)

## 2019-10-22 MED ORDER — ENSURE ENLIVE PO LIQD
237.0000 mL | Freq: Two times a day (BID) | ORAL | Status: DC
  Administered 2019-10-23 – 2019-10-25 (×4): 237 mL via ORAL

## 2019-10-22 MED ORDER — POLYETHYLENE GLYCOL 3350 17 G PO PACK
17.0000 g | PACK | Freq: Every day | ORAL | Status: DC
  Administered 2019-10-22 – 2019-10-24 (×2): 17 g via ORAL
  Filled 2019-10-22 (×4): qty 1

## 2019-10-22 MED ORDER — POTASSIUM CHLORIDE CRYS ER 20 MEQ PO TBCR
40.0000 meq | EXTENDED_RELEASE_TABLET | ORAL | Status: AC
  Administered 2019-10-22 (×2): 40 meq via ORAL
  Filled 2019-10-22 (×2): qty 2

## 2019-10-22 MED ORDER — ENSURE MAX PROTEIN PO LIQD
11.0000 [oz_av] | Freq: Two times a day (BID) | ORAL | Status: DC
  Filled 2019-10-22: qty 330

## 2019-10-22 MED ORDER — LACTATED RINGERS IV SOLN
INTRAVENOUS | Status: DC
  Administered 2019-10-22 – 2019-10-24 (×4): via INTRAVENOUS

## 2019-10-22 NOTE — Progress Notes (Addendum)
PROGRESS NOTE    Gabrielle Crosby  [id number] DOB: Jun 06, 1933 DOA: 10/19/2019 PCP: Toni Arthurs, NP    Brief Narrative:   Gabrielle Crosby  is a 85 y.o. Caucasian female with a known history of multiple medical problems that will be mentioned below, presented to the emergency room with acute onset of bilious vomitus as well as constipation with stool impaction which have been going on over the last couple days.  She denied any fever or chills.  She has been having abdominal discomfort with constipation.  No chest pain or dyspnea or palpitations.  No melena or bright red blood per rectum or hematemesis or jaundice.  No dysuria, oliguria or hematuria or flank pain.  Upon presentation to the emergency room, blood pressure was 155/71 and later 160/58 with otherwise normal vital signs.  Labs revealed hypokalemia of 3 and magnesium level came back 1.8 with lipase levels 24 and mild anemia.  High-sensitivity troponin I was 15.  Urinalysis showed 130 glucose and 20 ketones.  Abdominal pelvic CT scan showed her cholecystectomy and intrahepatic and extrahepatic biliary dilatation with common duct measuring up to 15 mm with recommendation for MRCP, slight bilateral hydronephrosis that may be due to the urinary bladder distention and sigmoid diverticulosis with large volume of stool in the colon and rectum and small right greater than left pleural effusions with partial consolidation and adjacent groundglass density in the lower lobes reflecting atelectasis or pneumonia with no acute abdominal or pelvic abnormalities.  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  EKG showed normal sinus rhythm with a rate of 81 with left atrial enlargement.  The patient was given 1 L bolus of IV normal saline as well as 4 mg of IV Zofran twice and 10 leucovorin IV potassium chloride.  She was  disimpacted by the ER physician.  She will be admitted to medical monitored bed for further evaluation and management.   Interim History: -11/20: MRCP  1. Moderate biliary dilatation status post cholecystectomy. No evidence of choledocholithiasis. Given similar appearance on  previous lumbar CT and normal liver function studies, this is probably physiologic.  2. Mild pancreatic ductal dilatation without evidence of pancreatic mass or pancreas divisum.  3. Bilateral pleural effusions with associated atelectasis at both lung bases.  4. Calcified left-sided disc protrusion at T10-11.  -GI consulted, due to unremarkable MRCP findings, recommended laxative treatment  -11/21: WBC no improvement, 17.7 -->17.7. will change Abx from Unasyn and azithromycin to vancomycin and unasyn - 11/22: WBC improved 17.7 -->9.7.   Subjective: Pt has dementia, only gives limited information.  Patient seems to have some abdominal pain, no active nausea vomiting, diarrhea noted.  Does not seem to have chest pain.  Assessment & Plan:   Principal Problem:   Intractable nausea and vomiting Active Problems:   Malignant neoplasm of urinary bladder (HCC)   Mixed Alzheimer's and vascular dementia (Gabrielle Crosby)   Essential (primary) hypertension   HLD (hyperlipidemia)   GERD (gastroesophageal reflux disease)   Aspiration pneumonia (HCC)   Bilateral hydronephrosis   Elevated troponin  Intractable nausea and vomiting and common bile duct dilatation with intra and extrahepatic biliary dilatation:  pt has constipation and fecal impaction. CT scan showed cholecystectomy and intrahepatic and extrahepatic biliary dilatation with common duct measuring up to 15 mm with recommendation for MRCP which is done 11/20. It showed dilated common bile duct  that was also seen on her CT scan.  The radiologist had reported that the dilated not changed from the prior imaging.   GI, Dr. Allen Norris was consulted and highly appreciated his  consultation. He recommended "The patient should be treated with laxatives to continue to help her clear out her bowels in case this is one of the reasons she is having her nausea and vomiting.  If the patient continues to have nausea and vomiting she may need an upper endoscopy to evaluate her esophagus and stomach to make sure she does not have an obstructive lesion". Dr. Marius Ditch saw pt 11/21 and 11/22, which is highly appreciated. She recommended to continue bowel regimen.  -Continue Dulcolax suppository as needed as well as oral MiraLAX daily - dys 1 diet. - prn zofran  Suspected aspiration pneumonia with bilateral pleural effusion: Leukocytosis improved. SLP consulted, recommended "Dysphagive level 1 (puree) w/ Nectar consistency liquids; aspiration precautions; feeding support at all meals. Reduce distractions. Medication Administration: Crushed with puree". - f/u Bx - IV Unasyn and Zithromax --> changed to vancomycin and unasyn 11/21 - sputum Gram stain culture  CBC Latest Ref Rng & Units 10/22/2019 10/21/2019 10/20/2019  WBC 4.0 - 10.5 K/uL 9.7 17.7(H) 17.7(H)  Hemoglobin 12.0 - 15.0 g/dL 8.7(L) 10.6(L) 11.5(L)  Hematocrit 36.0 - 46.0 % 27.1(L) 33.0(L) 34.5(L)  Platelets 150 - 400 K/uL 278 461(H) 506(H)   Hypokalemia: k 3.2. Mg 2.1 -repleted  Depression.   -Celexa.  Uncontrolled hypertension.   -We will continue Zestril and place the patient on as needed IV labetalol.  Dementia with behavioral changes.   -Continue Namenda and Seroquel.  Dyslipidemia.   -Statin  Elevated trop: trop 15 -->24 --> 70 --> 80. No CP, possible demand ischemia. -ASA and statin   bilateral hydronephrosis and hx of malignant neoplasm of urinary bladder: CT scan showed slight bilateral hydronephrosis that may be due to the urinary bladder distention and sigmoid diverticulosis with large volume of stool in the colon and rectum. Cre normal 0.52 and BUN 9 -will place Foley in   DVT prophylaxis: sq  Heparin Code Status: DNR per her daughter 11/21 Family Communication: daughter by phone Disposition Plan: Not ready for discharge.  Pt has multiple complicated presentations. Still need IV double antibiotics. Consultants:  GI  Procedures:  Pending MRCP  Antimicrobials: Anti-infectives (From admission, onward)   Start     Dose/Rate Route Frequency Ordered Stop   10/21/19 1800  Ampicillin-Sulbactam (UNASYN) 3 g in sodium chloride 0.9 % 100 mL IVPB     3 g 200 mL/hr over 30 Minutes Intravenous Every 6 hours 10/21/19 1224     10/21/19 1300  vancomycin (VANCOCIN) 1,250 mg in sodium chloride 0.9 % 250 mL IVPB  Status:  Discontinued     1,250 mg 166.7 mL/hr over 90 Minutes Intravenous Every 48 hours 10/21/19 1223 10/21/19 1227   10/21/19 1300  Vancomycin (VANCOCIN) 1,250 mg in sodium chloride 0.9 % 250 mL IVPB     1,250 mg 166.7 mL/hr over 90 Minutes Intravenous Every 48 hours 10/21/19 1227     10/20/19 0645  Ampicillin-Sulbactam (UNASYN) 3 g in sodium chloride 0.9 % 100 mL IVPB  Status:  Discontinued     3 g 200 mL/hr over 30 Minutes Intravenous Every 6 hours 10/20/19 0639 10/21/19 1153   10/20/19 0645  azithromycin (ZITHROMAX) 500 mg in sodium chloride 0.9 % 250 mL IVPB  Status:  Discontinued     500 mg 250 mL/hr over 60 Minutes Intravenous Every  24 hours 10/20/19 0639 10/21/19 1153          Objective: Vitals:   10/21/19 1510 10/21/19 1933 10/22/19 0359 10/22/19 1347  BP: (!) 131/59 (!) 135/52 (!) 141/67 (!) 165/67  Pulse: 86 83 69 (!) 59  Resp: 15 16 20 18   Temp: 98.7 F (37.1 C) 99 F (37.2 C) 98.6 F (37 C) 97.8 F (36.6 C)  TempSrc: Axillary Oral Oral Oral  SpO2: 94% 95% 94% 97%  Weight:      Height:        Intake/Output Summary (Last 24 hours) at 10/22/2019 1708 Last data filed at 10/22/2019 1300 Gross per 24 hour  Intake 816.06 ml  Output 1200 ml  Net -383.94 ml   Filed Weights   10/19/19 1520  Weight: 46 kg    Examination:  Physical  Exam:  General: Not in acute distress HEENT: PERRL, EOMI, no scleral icterus, No JVD or bruit Cardiac: S1/S2, RRR, No murmurs, gallops or rubs Pulm: has rhonchi bilaterally.  Abd: Soft, nondistended, has tenderness in epigastric area, no rebound pain, no organomegaly, BS present Ext: No edema. 2+DP/PT pulse bilaterally Musculoskeletal: No joint deformities, erythema, or stiffness, ROM full Skin: No rashes.  Neuro: Alert, cranial nerves II-XII grossly intact, moves all extremeties. Psych: Patient is not psychotic, no suicidal or hemocidal ideation.    Data Reviewed: I have personally reviewed following labs and imaging studies  CBC: Recent Labs  Lab 10/19/19 1538 10/20/19 0548 10/21/19 0631 10/22/19 0447  WBC 9.3 17.7* 17.7* 9.7  HGB 11.2* 11.5* 10.6* 8.7*  HCT 33.4* 34.5* 33.0* 27.1*  MCV 91.3 92.7 94.6 96.1  PLT 459* 506* 461* 0000000   Basic Metabolic Panel: Recent Labs  Lab 10/19/19 1538 10/19/19 1716 10/20/19 0548 10/21/19 0631 10/22/19 0447  NA 139  --  138 139 145  K 3.0*  --  3.3* 4.0 3.2*  CL 104  --  104 109 115*  CO2 22  --  22 18* 22  GLUCOSE 155*  --  165* 119* 96  BUN 11  --  9 12 19   CREATININE 0.59  --  0.52 0.38* 0.55  CALCIUM 8.7*  --  8.6* 8.4* 8.2*  MG  --  1.8  --   --  2.1   GFR: Estimated Creatinine Clearance: 38.3 mL/min (by C-G formula based on SCr of 0.55 mg/dL). Liver Function Tests: Recent Labs  Lab 10/19/19 1538  AST 23  ALT 13  ALKPHOS 64  BILITOT 0.7  PROT 7.1  ALBUMIN 3.6   Recent Labs  Lab 10/19/19 1538  LIPASE 24   No results for input(s): AMMONIA in the last 168 hours. Coagulation Profile: No results for input(s): INR, PROTIME in the last 168 hours. Cardiac Enzymes: No results for input(s): CKTOTAL, CKMB, CKMBINDEX, TROPONINI in the last 168 hours. BNP (last 3 results) No results for input(s): PROBNP in the last 8760 hours. HbA1C: No results for input(s): HGBA1C in the last 72 hours. CBG: No results for  input(s): GLUCAP in the last 168 hours. Lipid Profile: No results for input(s): CHOL, HDL, LDLCALC, TRIG, CHOLHDL, LDLDIRECT in the last 72 hours. Thyroid Function Tests: No results for input(s): TSH, T4TOTAL, FREET4, T3FREE, THYROIDAB in the last 72 hours. Anemia Panel: No results for input(s): VITAMINB12, FOLATE, FERRITIN, TIBC, IRON, RETICCTPCT in the last 72 hours. Sepsis Labs: No results for input(s): PROCALCITON, LATICACIDVEN in the last 168 hours.  Recent Results (from the past 240 hour(s))  SARS CORONAVIRUS 2 (TAT 6-24  HRS) Nasopharyngeal Nasopharyngeal Swab     Status: None   Collection Time: 10/20/19  1:43 AM   Specimen: Nasopharyngeal Swab  Result Value Ref Range Status   SARS Coronavirus 2 NEGATIVE NEGATIVE Final    Comment: (NOTE) SARS-CoV-2 target nucleic acids are NOT DETECTED. The SARS-CoV-2 RNA is generally detectable in upper and lower respiratory specimens during the acute phase of infection. Negative results do not preclude SARS-CoV-2 infection, do not rule out co-infections with other pathogens, and should not be used as the sole basis for treatment or other patient management decisions. Negative results must be combined with clinical observations, patient history, and epidemiological information. The expected result is Negative. Fact Sheet for Patients: SugarRoll.be Fact Sheet for Healthcare Providers: https://www.woods-mathews.com/ This test is not yet approved or cleared by the [place] FDA and  has been authorized for detection and/or diagnosis of SARS-CoV-2 by FDA under an Emergency Use Authorization (EUA). This EUA will remain  in effect (meaning this test can be used) for the duration of the COVID-19 declaration under Section 56 4(b)(1) of the Act, 21 U.S.C. section 360bbb-3(b)(1), unless the authorization is terminated or revoked sooner. Performed at [facility], [place] [address], [place],  [place] [address]   CULTURE, BLOOD (ROUTINE X 2) w Reflex to ID Panel     Status: None (Preliminary result)   Collection Time: 10/20/19  9:40 AM   Specimen: BLOOD  Result Value Ref Range Status   Specimen Description BLOOD RIGHT ANTECUBITAL  Final   Special Requests   Final    BOTTLES DRAWN AEROBIC AND ANAEROBIC Blood Culture results may not be optimal due to an inadequate volume of blood received in culture bottles   Culture   Final    NO GROWTH 2 DAYS Performed at [facility], [address] [place], [place] [address]    Report Status PENDING  Incomplete  CULTURE, BLOOD (ROUTINE X 2) w Reflex to ID Panel     Status: None (Preliminary result)   Collection Time: 10/20/19  9:40 AM   Specimen: BLOOD  Result Value Ref Range Status   Specimen Description BLOOD BLOOD RIGHT FOREARM  Final   Special Requests   Final    BOTTLES DRAWN AEROBIC AND ANAEROBIC Blood Culture adequate volume   Culture   Final    NO GROWTH 2 DAYS Performed at [facility], [address] [place], [place] [address]    Report Status PENDING  Incomplete       Radiology Studies: Dg Chest Port 1 View  Result Date: 10/21/2019 CLINICAL DATA:  Aspiration. EXAM: PORTABLE CHEST 1 VIEW COMPARISON:  05/02/2014 FINDINGS: 0913 hours. Cardiopericardial silhouette is at upper limits of normal for size. There is pulmonary vascular congestion without overt pulmonary edema. Interstitial markings are diffusely coarsened with chronic features. Bibasilar atelectasis/infiltrate with tiny left pleural effusion. Bones are diffusely demineralized. Telemetry leads overlie the chest. IMPRESSION: Bibasilar atelectasis/infiltrate with small left pleural effusion. Electronically Signed   By: Misty Stanley M.D.   On: 10/21/2019 11:15        Scheduled Meds: . aspirin  81 mg Oral Daily  . bisacodyl  10 mg Rectal Daily  . Chlorhexidine Gluconate Cloth  6 each Topical Daily  . cholecalciferol  2,000 Units Oral Daily  .  citalopram  20 mg Oral Daily  . [START ON 10/23/2019] feeding supplement (ENSURE ENLIVE)  237 mL Oral BID BM  . guaiFENesin  10 mL Oral Q4H while awake  . heparin  injection (subcutaneous)  5,000 Units Subcutaneous Q8H  . lisinopril  5 mg Oral Daily  . magnesium oxide  200 mg Oral Daily  . memantine  10 mg Oral BID  . polyethylene glycol  17 g Oral Daily  . pravastatin  20 mg Oral Daily  . Ensure Max Protein  11 oz Oral BID  . QUEtiapine  25 mg Oral q morning - 10a   And  . QUEtiapine  50 mg Oral QHS  . sodium chloride flush  3 mL Intravenous Once  . traZODone  50 mg Oral QHS  . vitamin B-12  1,000 mcg Oral Daily   Continuous Infusions: . ampicillin-sulbactam (UNASYN) IV 3 g (10/22/19 0958)  . lactated ringers 75 mL/hr at 10/22/19 1246  . vancomycin Stopped (10/21/19 2031)     LOS: 2 days    Time spent: 30 min     Ivor Costa, DO [facility] PAGER is on [place]  If 7PM-7AM, please contact night-coverage [email or web address] Password [facility] 10/22/2019, 5:08 PM

## 2019-10-22 NOTE — Progress Notes (Signed)
Braced ordered from supply chain is to long for patient's arm. Spoke to nurse from 1A Ortho floor and unfortunately they do not have a smaller wrist brace.   Fuller Mandril, RN

## 2019-10-22 NOTE — Progress Notes (Signed)
Gabrielle Darby, MD [address]  [place]  [place], [place] [address]  Main: [phone]  Fax: [phone] Pager: [phone]   Subjective: She is not experiencing nausea or vomiting.  Patient's daughter is bedside.  Patient tolerated yogurt and Magic cup today as well as some solid food.  She had a large bowel movement yesterday, received Dulcolax suppository.   Objective: Vital signs in last 24 hours: Vitals:   10/21/19 1510 10/21/19 1933 10/22/19 0359 10/22/19 1347  BP: (!) 131/59 (!) 135/52 (!) 141/67 (!) 165/67  Pulse: 86 83 69 (!) 59  Resp: 15 16 20 18   Temp: 98.7 F (37.1 C) 99 F (37.2 C) 98.6 F (37 C) 97.8 F (36.6 C)  TempSrc: Axillary Oral Oral Oral  SpO2: 94% 95% 94% 97%  Weight:      Height:       Weight change:   Intake/Output Summary (Last 24 hours) at 10/22/2019 1635 Last data filed at 10/22/2019 1300 Gross per 24 hour  Intake 816.06 ml  Output 1200 ml  Net -383.94 ml     Exam: Heart:: Regular rate and rhythm, S1S2 present or without murmur or extra heart sounds Lungs: normal and clear to auscultation Abdomen: soft, nontender, normal bowel sounds   Lab Results: CBC Latest Ref Rng & Units 10/22/2019 10/21/2019 10/20/2019  WBC 4.0 - 10.5 K/uL 9.7 17.7(H) 17.7(H)  Hemoglobin 12.0 - 15.0 g/dL 8.7(L) 10.6(L) 11.5(L)  Hematocrit 36.0 - 46.0 % 27.1(L) 33.0(L) 34.5(L)  Platelets 150 - 400 K/uL 278 461(H) 506(H)   CMP Latest Ref Rng & Units 10/22/2019 10/21/2019 10/20/2019  Glucose 70 - 99 mg/dL 96 119(H) 165(H)  BUN 8 - 23 mg/dL 19 12 9   Creatinine 0.44 - 1.00 mg/dL 0.55 0.38(L) 0.52  Sodium 135 - 145 mmol/L 145 139 138  Potassium 3.5 - 5.1 mmol/L 3.2(L) 4.0 3.3(L)  Chloride 98 - 111 mmol/L 115(H) 109 104  CO2 22 - 32 mmol/L 22 18(L) 22  Calcium 8.9 - 10.3 mg/dL 8.2(L) 8.4(L) 8.6(L)  Total Protein 6.5 - 8.1 g/dL - - -  Total Bilirubin 0.3 - 1.2 mg/dL - - -  Alkaline Phos 38 - 126 U/L - - -  AST 15 - 41 U/L - - -  ALT 0 - 44 U/L  - - -    Micro Results: Recent Results (from the past 240 hour(s))  SARS CORONAVIRUS 2 (TAT 6-24 HRS) Nasopharyngeal Nasopharyngeal Swab     Status: None   Collection Time: 10/20/19  1:43 AM   Specimen: Nasopharyngeal Swab  Result Value Ref Range Status   SARS Coronavirus 2 NEGATIVE NEGATIVE Final    Comment: (NOTE) SARS-CoV-2 target nucleic acids are NOT DETECTED. The SARS-CoV-2 RNA is generally detectable in upper and lower respiratory specimens during the acute phase of infection. Negative results do not preclude SARS-CoV-2 infection, do not rule out co-infections with other pathogens, and should not be used as the sole basis for treatment or other patient management decisions. Negative results must be combined with clinical observations, patient history, and epidemiological information. The expected result is Negative. Fact Sheet for Patients: SugarRoll.be Fact Sheet for Healthcare Providers: https://www.woods-mathews.com/ This test is not yet approved or cleared by the [place] FDA and  has been authorized for detection and/or diagnosis of SARS-CoV-2 by FDA under an Emergency Use Authorization (EUA). This EUA will remain  in effect (meaning this test can be used) for the duration of the COVID-19 declaration under Section 56 4(b)(1) of  the Act, 21 U.S.C. section 360bbb-3(b)(1), unless the authorization is terminated or revoked sooner. Performed at [facility], [place] [address] [place], [place] [address]   CULTURE, BLOOD (ROUTINE X 2) w Reflex to ID Panel     Status: None (Preliminary result)   Collection Time: 10/20/19  9:40 AM   Specimen: BLOOD  Result Value Ref Range Status   Specimen Description BLOOD RIGHT ANTECUBITAL  Final   Special Requests   Final    BOTTLES DRAWN AEROBIC AND ANAEROBIC Blood Culture results may not be optimal due to an inadequate volume of blood received in culture bottles   Culture   Final     NO GROWTH 2 DAYS Performed at [facility], [address] [place], [place] [address]    Report Status PENDING  Incomplete  CULTURE, BLOOD (ROUTINE X 2) w Reflex to ID Panel     Status: None (Preliminary result)   Collection Time: 10/20/19  9:40 AM   Specimen: BLOOD  Result Value Ref Range Status   Specimen Description BLOOD BLOOD RIGHT FOREARM  Final   Special Requests   Final    BOTTLES DRAWN AEROBIC AND ANAEROBIC Blood Culture adequate volume   Culture   Final    NO GROWTH 2 DAYS Performed at [facility], [address] [place], [place] [address]    Report Status PENDING  Incomplete   Studies/Results: Dg Chest Port 1 View  Result Date: 10/21/2019 CLINICAL DATA:  Aspiration. EXAM: PORTABLE CHEST 1 VIEW COMPARISON:  05/02/2014 FINDINGS: 0913 hours. Cardiopericardial silhouette is at upper limits of normal for size. There is pulmonary vascular congestion without overt pulmonary edema. Interstitial markings are diffusely coarsened with chronic features. Bibasilar atelectasis/infiltrate with tiny left pleural effusion. Bones are diffusely demineralized. Telemetry leads overlie the chest. IMPRESSION: Bibasilar atelectasis/infiltrate with small left pleural effusion. Electronically Signed   By: Misty Stanley M.D.   On: 10/21/2019 11:15   Medications:  I have reviewed the patient's current medications. Prior to Admission:  Medications Prior to Admission  Medication Sig Dispense Refill Last Dose  . aspirin EC 81 MG tablet Take 81 mg by mouth daily.   10/19/2019 at Unknown time  . Cholecalciferol (VITAMIN D3) 50 MCG (2000 UT) capsule Take 2,000 Units by mouth daily.    10/19/2019 at Unknown time  . citalopram (CELEXA) 20 MG tablet Take 20 mg by mouth daily.    10/19/2019 at Unknown time  . ibuprofen (ADVIL) 600 MG tablet Take 600 mg by mouth every 6 (six) hours as needed.   10/19/2019 at Unknown time  . lisinopril (ZESTRIL) 5 MG tablet Take 1 tablet (5 mg total) by  mouth daily.   10/19/2019 at Unknown time  . lovastatin (MEVACOR) 20 MG tablet Take 20 mg by mouth at bedtime.   Past Week at Unknown time  . memantine (NAMENDA) 10 MG tablet Take 10 mg by mouth 2 (two) times daily.    10/19/2019 at Unknown time  . QUEtiapine (SEROQUEL) 25 MG tablet Take 50 mg by mouth 2 (two) times daily. 25 mg qam  50 mg qhs   10/19/2019 at Unknown time  . traZODone (DESYREL) 50 MG tablet Take 50 mg by mouth at bedtime.   Past Week at Unknown time  . vitamin B-12 (CYANOCOBALAMIN) 1000 MCG tablet Take 1,000 mcg by mouth daily.   10/19/2019 at Unknown time  . acetaminophen (TYLENOL) 500 MG tablet Take 500 mg by mouth 2 (two) times daily.  Not Taking at Unknown time  . aspirin EC 325 MG EC tablet Take 1 tablet (325 mg total) by mouth daily. (Patient not taking: Reported on 10/20/2019) 30 tablet 0 Not Taking at Unknown time  . atorvastatin (LIPITOR) 40 MG tablet Take 1 tablet (40 mg total) by mouth daily at 6 PM. (Patient not taking: Reported on 10/20/2019)   Not Taking at Unknown time  . docusate sodium (COLACE) 50 MG capsule Take 50 mg by mouth 2 (two) times daily as needed for mild constipation.   Not Taking at Unknown time  . Magnesium 100 MG CAPS Take 100 mg by mouth daily.    Not Taking at Unknown time  . sulfamethoxazole-trimethoprim (BACTRIM DS) 800-160 MG tablet Take 1 tablet by mouth every 12 (twelve) hours. (Patient not taking: Reported on 10/20/2019) 8 tablet 0 Not Taking at Unknown time   Scheduled: . aspirin  81 mg Oral Daily  . bisacodyl  10 mg Rectal Daily  . Chlorhexidine Gluconate Cloth  6 each Topical Daily  . cholecalciferol  2,000 Units Oral Daily  . citalopram  20 mg Oral Daily  . [START ON 10/23/2019] feeding supplement (ENSURE ENLIVE)  237 mL Oral BID BM  . guaiFENesin  10 mL Oral Q4H while awake  . heparin injection (subcutaneous)  5,000 Units Subcutaneous Q8H  . lisinopril  5 mg Oral Daily  . magnesium oxide  200 mg Oral Daily  . memantine  10 mg  Oral BID  . polyethylene glycol  17 g Oral Daily  . pravastatin  20 mg Oral Daily  . Ensure Max Protein  11 oz Oral BID  . QUEtiapine  25 mg Oral q morning - 10a   And  . QUEtiapine  50 mg Oral QHS  . sodium chloride flush  3 mL Intravenous Once  . traZODone  50 mg Oral QHS  . vitamin B-12  1,000 mcg Oral Daily   Continuous: . ampicillin-sulbactam (UNASYN) IV 3 g (10/22/19 0958)  . lactated ringers 75 mL/hr at 10/22/19 1246  . vancomycin Stopped (10/21/19 2031)   [id number] **OR** acetaminophen, hydrALAZINE, magnesium hydroxide, [DISCONTINUED] ondansetron **OR** ondansetron (ZOFRAN) IV, sodium phosphate Anti-infectives (From admission, onward)   Start     Dose/Rate Route Frequency Ordered Stop   10/21/19 1800  Ampicillin-Sulbactam (UNASYN) 3 g in sodium chloride 0.9 % 100 mL IVPB     3 g 200 mL/hr over 30 Minutes Intravenous Every 6 hours 10/21/19 1224     10/21/19 1300  vancomycin (VANCOCIN) 1,250 mg in sodium chloride 0.9 % 250 mL IVPB  Status:  Discontinued     1,250 mg 166.7 mL/hr over 90 Minutes Intravenous Every 48 hours 10/21/19 1223 10/21/19 1227   10/21/19 1300  Vancomycin (VANCOCIN) 1,250 mg in sodium chloride 0.9 % 250 mL IVPB     1,250 mg 166.7 mL/hr over 90 Minutes Intravenous Every 48 hours 10/21/19 1227     10/20/19 0645  Ampicillin-Sulbactam (UNASYN) 3 g in sodium chloride 0.9 % 100 mL IVPB  Status:  Discontinued     3 g 200 mL/hr over 30 Minutes Intravenous Every 6 hours 10/20/19 0639 10/21/19 1153   10/20/19 0645  azithromycin (ZITHROMAX) 500 mg in sodium chloride 0.9 % 250 mL IVPB  Status:  Discontinued     500 mg 250 mL/hr over 60 Minutes Intravenous Every 24 hours 10/20/19 0639 10/21/19 1153     Scheduled Meds: . aspirin  81 mg Oral Daily  . bisacodyl  10 mg Rectal Daily  .  Chlorhexidine Gluconate Cloth  6 each Topical Daily  . cholecalciferol  2,000 Units Oral Daily  . citalopram  20 mg Oral Daily  . [START ON 10/23/2019] feeding supplement  (ENSURE ENLIVE)  237 mL Oral BID BM  . guaiFENesin  10 mL Oral Q4H while awake  . heparin injection (subcutaneous)  5,000 Units Subcutaneous Q8H  . lisinopril  5 mg Oral Daily  . magnesium oxide  200 mg Oral Daily  . memantine  10 mg Oral BID  . polyethylene glycol  17 g Oral Daily  . pravastatin  20 mg Oral Daily  . Ensure Max Protein  11 oz Oral BID  . QUEtiapine  25 mg Oral q morning - 10a   And  . QUEtiapine  50 mg Oral QHS  . sodium chloride flush  3 mL Intravenous Once  . traZODone  50 mg Oral QHS  . vitamin B-12  1,000 mcg Oral Daily   Continuous Infusions: . ampicillin-sulbactam (UNASYN) IV 3 g (10/22/19 0958)  . lactated ringers 75 mL/hr at 10/22/19 1246  . vancomycin Stopped (10/21/19 2031)   PRN Meds:.acetaminophen **OR** acetaminophen, hydrALAZINE, magnesium hydroxide, [DISCONTINUED] ondansetron **OR** ondansetron (ZOFRAN) IV, sodium phosphate   Assessment: Principal Problem:   Intractable nausea and vomiting Active Problems:   Malignant neoplasm of urinary bladder (HCC)   Mixed Alzheimer's and vascular dementia ([place])   Essential (primary) hypertension   HLD (hyperlipidemia)   GERD (gastroesophageal reflux disease)   Aspiration pneumonia (HCC)   Bilateral hydronephrosis   Elevated troponin  Nausea and vomiting likely secondary to severe constipation which is evident on the CT scan No evidence of biliary obstruction or cholangitis.  LFTs normal Leukocytosis has resolved.  WBCs, hemoglobin and platelets are down mostly secondary to hemodilution  Plan: Continue bowel regimen Continue Dulcolax suppository as needed as well as oral MiraLAX daily Discussed with patient's daughter about MiraLAX daily as well as Metamucil daily at assisted living facility to avoid constipation Advance diet as tolerated, encourage protein supplements 1-2 times a day GI will sign off at this time, please call us back with questions or concerns    LOS: 2 days   Gabrielle Crosby 10/22/2019, 4:35 PM

## 2019-10-22 NOTE — Progress Notes (Signed)
Per daughter patient had hit her right wrist against a table. Wrist was painful to touch and edematous. Patient was taken to [facility] and wrist was X-rayed. X-ray showed fracture. Patient was placed with a removable splint. Splint unfortunately became soiled with stool. Daughter said she would take it home to wash. Received order from Dr. Blaine Hamper to order brace from supply chain.   Fuller Mandril, RN

## 2019-10-23 LAB — BASIC METABOLIC PANEL
Anion gap: 9 (ref 5–15)
BUN: 16 mg/dL (ref 8–23)
CO2: 23 mmol/L (ref 22–32)
Calcium: 8.7 mg/dL — ABNORMAL LOW (ref 8.9–10.3)
Chloride: 113 mmol/L — ABNORMAL HIGH (ref 98–111)
Creatinine, Ser: 0.46 mg/dL (ref 0.44–1.00)
GFR calc Af Amer: >60 mL/min (ref >60)
GFR calc non Af Amer: >60 mL/min (ref >60)
Glucose, Bld: 99 mg/dL (ref 70–99)
Potassium: 3.4 mmol/L — ABNORMAL LOW (ref 3.5–5.1)
Sodium: 145 mmol/L (ref 135–145)

## 2019-10-23 LAB — CBC
HCT: 32.5 % — ABNORMAL LOW (ref 36.0–46.0)
Hemoglobin: 10.2 g/dL — ABNORMAL LOW (ref 12.0–15.0)
MCH: 30.3 pg (ref 26.0–34.0)
MCHC: 31.4 g/dL (ref 30.0–36.0)
MCV: 96.4 fL (ref 80.0–100.0)
Platelets: 324 10*3/uL (ref 150–400)
RBC: 3.37 MIL/uL — ABNORMAL LOW (ref 3.87–5.11)
RDW: 13.6 % (ref 11.5–15.5)
WBC: 7.1 10*3/uL (ref 4.0–10.5)
nRBC: 0 % (ref 0.0–0.2)

## 2019-10-23 LAB — MRSA PCR SCREENING: MRSA by PCR: NEGATIVE

## 2019-10-23 LAB — TROPONIN I (HIGH SENSITIVITY)
Troponin I (High Sensitivity): 32 ng/L — ABNORMAL HIGH (ref <18)
Troponin I (High Sensitivity): 31 ng/L — ABNORMAL HIGH (ref <18)

## 2019-10-23 MED ORDER — SODIUM CHLORIDE 0.9 % IV SOLN
INTRAVENOUS | Status: DC | PRN
  Administered 2019-10-23 – 2019-10-24 (×3): 1000 mL via INTRAVENOUS

## 2019-10-23 MED ORDER — POTASSIUM CHLORIDE CRYS ER 20 MEQ PO TBCR
40.0000 meq | EXTENDED_RELEASE_TABLET | Freq: Once | ORAL | Status: AC
  Administered 2019-10-23: 40 meq via ORAL
  Filled 2019-10-23: qty 2

## 2019-10-23 NOTE — Evaluation (Signed)
Occupational Therapy Evaluation Patient Details Name: Gabrielle Crosby MRN: [id number] DOB: 06-09-1933 Today's Date: 10/23/2019    History of Present Illness Pt is 85 yo female with PMH of bladder cancer, brain bleeds, dementia, depression, HLD, HTN, seizures, stroke, presented to ED for nausea, vomiting, abdominal pain.   Clinical Impression   Pt seen for OT evaluation and co-tx with PT this date. Prior to hospital admission, pt was living in an ALF per chart review. No family present, pt unable to provide historical/PLOF information. Currently pt demonstrates impairments in strength, cognition requiring mod-max verbal/tactile/visual cues for simple commands, balance, and R wrist fracture per chart, no brace in room, pt does not visually demo pain or discomfort with RUE, attempts to minimize RUE weightbearing out of abundance of precaution during session. Pt now requiring significant assist for seated and standing ADL and min A +2 for bed mobility and transfers.  Pt would benefit from skilled OT to address noted impairments and functional limitations (see below for any additional details) in order to maximize safety and independence while minimizing falls risk and caregiver burden.  Upon hospital discharge, recommend pt discharge to SNF.    Follow Up Recommendations  SNF    Equipment Recommendations  3 in 1 bedside commode    Recommendations for Other Services       Precautions / Restrictions Precautions Precautions: Fall Precaution Comments: R wrist fracture Required Braces or Orthoses: Splint/Cast Splint/Cast: per care everywhere pt should have cockup splint in place at all times - does not currently have 2/2 being soiled per chart review, nursing working to acquire new one Restrictions Weight Bearing Restrictions: (unclear)      Mobility Bed Mobility Overal bed mobility: Needs Assistance Bed Mobility: Supine to Sit;Sit to Supine     Supine to sit: +2 for  safety/equipment;Min assist;HOB elevated Sit to supine: Min assist;+2 for safety/equipment   General bed mobility comments: heavy tactile cues needed needed to initiate task, minA to complete  Transfers Overall transfer level: Needs assistance   Transfers: Sit to/from Stand Sit to Stand: Mod assist;+2 safety/equipment         General transfer comment: performed twice with handheld assist. Pt needed at least modA to maintain upright positioning/balance. Further ambulation deferred due to BM in standing.    Balance Overall balance assessment: Needs assistance   Sitting balance-Leahy Scale: Fair Sitting balance - Comments: Pt progressed to sitting EOB with supervision     Standing balance-Leahy Scale: Poor                             ADL either performed or assessed with clinical judgement   ADL Overall ADL's : Needs assistance/impaired                         Toilet Transfer: Moderate assistance;BSC;+2 for safety/equipment;RW   Toileting- Clothing Manipulation and Hygiene: Total assistance Toileting - Clothing Manipulation Details (indicate cue type and reason): total for pericare, with no awareness of bowel movement             Vision Patient Visual Report: No change from baseline       Perception     Praxis      Pertinent Vitals/Pain Pain Assessment: Faces Faces Pain Scale: No hurt Pain Intervention(s): Limited activity within patient's tolerance;Monitored during session     Hand Dominance Right   Extremity/Trunk Assessment Upper Extremity Assessment Upper Extremity Assessment: Difficult  to assess due to impaired cognition;Generalized weakness(per chart, R wrist fracture, pt visually does not indicate pain with R wrist; attempts made to minimize weightbearing)   Lower Extremity Assessment Lower Extremity Assessment: Generalized weakness;Difficult to assess due to impaired cognition   Cervical / Trunk Assessment Cervical / Trunk  Assessment: Kyphotic   Communication Communication Communication: Other (comment);No difficulties   Cognition Arousal/Alertness: Awake/alert Behavior During Therapy: WFL for tasks assessed/performed Overall Cognitive Status: No family/caregiver present to determine baseline cognitive functioning                                 General Comments: pt oriented to name only, follows simple commands with mod cues, easily distracted   General Comments       Exercises Other Exercises Other Exercises: bed mobility and functional transfer training with PT for toileting and pericare   Shoulder Instructions      Home Living Family/patient expects to be discharged to:: Assisted living                                 Additional Comments: Per chart review, pt ambulates at baseline without AD. no family at bedside to confirm      Prior Functioning/Environment Level of Independence: Needs assistance        Comments: Per chart pt is at assisted living facility, pt unable to provide information on level of assistance needed        OT Problem List: Decreased strength;Decreased cognition;Decreased safety awareness;Impaired balance (sitting and/or standing);Decreased knowledge of use of DME or AE;Decreased activity tolerance      OT Treatment/Interventions: Self-care/ADL training;Therapeutic exercise;Therapeutic activities;DME and/or AE instruction;Patient/family education;Balance training;Cognitive remediation/compensation    OT Goals(Current goals can be found in the care plan section) Acute Rehab OT Goals Patient Stated Goal: pt unable to verbalize OT Goal Formulation: Patient unable to participate in goal setting ADL Goals Pt Will Perform Upper Body Dressing: with min assist;sitting Pt Will Transfer to Toilet: with min guard assist;ambulating;regular height toilet(LRAD for amb)  OT Frequency: Min 1X/week   Barriers to D/C:            Co-evaluation  PT/OT/SLP Co-Evaluation/Treatment: Yes Reason for Co-Treatment: For patient/therapist safety;To address functional/ADL transfers PT goals addressed during session: Mobility/safety with mobility;Balance OT goals addressed during session: ADL's and self-care      AM-PAC OT "6 Clicks" Daily Activity     Outcome Measure Help from another person eating meals?: A Little Help from another person taking care of personal grooming?: A Little Help from another person toileting, which includes using toliet, bedpan, or urinal?: Total Help from another person bathing (including washing, rinsing, drying)?: A Lot Help from another person to put on and taking off regular upper body clothing?: A Lot Help from another person to put on and taking off regular lower body clothing?: A Lot 6 Click Score: 13   End of Session Equipment Utilized During Treatment: Gait belt  Activity Tolerance: Patient tolerated treatment well Patient left: in bed;with call bell/phone within reach;with bed alarm set;with nursing/sitter in room  OT Visit Diagnosis: Other abnormalities of gait and mobility (R26.89);Muscle weakness (generalized) (M62.81)                Time: [id number] OT Time Calculation (min): 15 min Charges:  OT General Charges $OT Visit: 1 Visit OT Evaluation $OT Eval Moderate Complexity: 1  Mod  Jeni Salles, MPH, MS, OTR/L ascom [phone] 10/23/19, 11:09 AM

## 2019-10-23 NOTE — NC FL2 (Addendum)
[place] LEVEL OF CARE SCREENING TOOL     IDENTIFICATION  Patient Name: Gabrielle Crosby Birthdate: 03-Jul-1933 Sex: female Admission Date (Current Location): 10/19/2019  [place] and [place] Number:  Engineering geologist and Address:  [facility], [address], [place], [place] [address]      Provider Number: [id number]  Attending Physician Name and Address:  Ivor Costa, MD  Relative Name and Phone Number:  [name] Daughter [id number]  [phone]    Current Level of Care: Hospital Recommended Level of Care: [place] Prior Approval Number:    Date Approved/Denied:   PASRR Number: [id number]  Discharge Plan: SNF    Current Diagnoses: Patient Active Problem List   Diagnosis Date Noted  . Intractable nausea and vomiting 10/20/2019  . Aspiration pneumonia ([place]) 10/20/2019  . Bilateral hydronephrosis 10/20/2019  . Elevated troponin 10/20/2019  . HLD (hyperlipidemia) 07/25/2019  . GERD (gastroesophageal reflux disease) 07/25/2019  . Facial droop 07/25/2019  . Loss of memory 07/26/2016  . Anxiety 06/26/2016  . Bradycardia 01/02/2015  . H/O deep venous thrombosis 01/02/2015  . MI (mitral incompetence) 01/02/2015  . Near syncope 01/02/2015  . Malignant neoplasm of urinary bladder ([place]) 08/22/2014  . Mixed Alzheimer's and vascular dementia ([place]) 08/22/2014  . Essential (primary) hypertension 08/22/2014  . Adaptive colitis 08/22/2014  . Lacunar infarction ([place]) 08/22/2014  . Combined fat and carbohydrate induced hyperlipemia 08/22/2014  . Allergic rhinitis, seasonal 08/22/2014    Orientation RESPIRATION BLADDER Height & Weight     Self  Normal Incontinent Weight: 101 lb 6.6 oz (46 kg) Height:  5' (152.4 cm)  BEHAVIORAL SYMPTOMS/MOOD NEUROLOGICAL BOWEL NUTRITION STATUS      Incontinent Diet(Dysphagia 1)  AMBULATORY STATUS COMMUNICATION OF NEEDS Skin   Limited Assist Verbally Normal                    Personal Care Assistance Level of Assistance  Bathing, Dressing, Feeding Bathing Assistance: Limited assistance Feeding assistance: Limited assistance Dressing Assistance: Limited assistance     Functional Limitations Info  Sight, Speech, Hearing Sight Info: Adequate Hearing Info: Adequate Speech Info: Adequate    SPECIAL CARE FACTORS FREQUENCY  PT (By licensed PT), OT (By licensed OT)     PT Frequency: Minimum 5x a week OT Frequency: Minimum 5x a week            Contractures Contractures Info: Not present    Additional Factors Info  Code Status, Allergies, Psychotropic Code Status Info: DNR Allergies Info: Atenolol Donepezil Hcl Psychotropic Info: citalopram (CELEXA) tablet 20 mg, QUEtiapine (SEROQUEL) tablet 25 mg, and QUEtiapine (SEROQUEL) tablet 50 mg         Current Medications (10/23/2019):  This is the current hospital active medication list Current Facility-Administered Medications  Medication Dose Route Frequency Provider Last Rate Last Dose  . acetaminophen (TYLENOL) tablet 650 mg  650 mg Oral Q6H PRN Mansy, Jan A, MD   650 mg at 10/22/19 0514   Or  . acetaminophen (TYLENOL) suppository 650 mg  650 mg Rectal Q6H PRN Mansy, Jan A, MD      . Ampicillin-Sulbactam (UNASYN) 3 g in sodium chloride 0.9 % 100 mL IVPB  3 g Intravenous Q6H Ivor Costa, MD 200 mL/hr at 10/23/19 1550 3 g at 10/23/19 1550  . aspirin chewable tablet 81 mg  81 mg Oral Daily Ivor Costa, MD   81 mg at 10/23/19 1035  . bisacodyl (DULCOLAX) suppository 10 mg  10 mg  Rectal Daily Lin Landsman, MD   10 mg at 10/22/19 1000  . Chlorhexidine Gluconate Cloth 2 % PADS 6 each  6 each Topical Daily Mansy, Arvella Merles, MD   6 each at 10/23/19 1309  . cholecalciferol (VITAMIN D3) tablet 2,000 Units  2,000 Units Oral Daily Mansy, Jan A, MD   2,000 Units at 10/23/19 1034  . citalopram (CELEXA) tablet 20 mg  20 mg Oral Daily Mansy, Jan A, MD   20 mg at 10/23/19 1034  . feeding supplement (ENSURE  ENLIVE) (ENSURE ENLIVE) liquid 237 mL  237 mL Oral BID BM Lin Landsman, MD   237 mL at 10/23/19 1309  . guaiFENesin (ROBITUSSIN) 100 MG/5ML solution 200 mg  10 mL Oral Q4H while awake Ivor Costa, MD   200 mg at 10/23/19 1036  . heparin injection 5,000 Units  5,000 Units Subcutaneous Q8H Ivor Costa, MD   5,000 Units at 10/23/19 1353  . hydrALAZINE (APRESOLINE) injection 5 mg  5 mg Intravenous Q2H PRN Ivor Costa, MD   5 mg at 10/23/19 1056  . lactated ringers infusion   Intravenous Continuous Ivor Costa, MD 75 mL/hr at 10/23/19 [phone]    . lisinopril (ZESTRIL) tablet 5 mg  5 mg Oral Daily Mansy, Jan A, MD   5 mg at 10/23/19 1034  . magnesium hydroxide (MILK OF MAGNESIA) suspension 30 mL  30 mL Oral Daily PRN Mansy, Jan A, MD      . magnesium oxide (MAG-OX) tablet 200 mg  200 mg Oral Daily Mansy, Jan A, MD   200 mg at 10/23/19 1036  . memantine (NAMENDA) tablet 10 mg  10 mg Oral BID Mansy, Jan A, MD   10 mg at 10/23/19 1036  . ondansetron (ZOFRAN) injection 4 mg  4 mg Intravenous Q6H PRN Mansy, Jan A, MD      . polyethylene glycol (MIRALAX / GLYCOLAX) packet 17 g  17 g Oral Daily Lin Landsman, MD   17 g at 10/22/19 1741  . pravastatin (PRAVACHOL) tablet 20 mg  20 mg Oral Daily Ivor Costa, MD   20 mg at 10/22/19 2303  . QUEtiapine (SEROQUEL) tablet 25 mg  25 mg Oral q morning - 10a Mansy, Jan A, MD   25 mg at 10/23/19 1037   And  . QUEtiapine (SEROQUEL) tablet 50 mg  50 mg Oral QHS Mansy, Jan A, MD   50 mg at 10/22/19 2302  . sodium chloride flush (NS) 0.9 % injection 3 mL  3 mL Intravenous Once Vanessa [place], MD      . sodium phosphate (FLEET) 7-19 GM/118ML enema 1 enema  1 enema Rectal Once PRN Mansy, Jan A, MD      . traZODone (DESYREL) tablet 50 mg  50 mg Oral QHS Mansy, Jan A, MD   50 mg at 10/22/19 2302  . vitamin B-12 (CYANOCOBALAMIN) tablet 1,000 mcg  1,000 mcg Oral Daily Mansy, Jan A, MD   1,000 mcg at 10/23/19 1037     Discharge Medications: Please see discharge summary for a  list of discharge medications.  Relevant Imaging Results:  Relevant Lab Results:   Additional Information SSN [id number]  Ross Ludwig, LCSW

## 2019-10-23 NOTE — Evaluation (Addendum)
Physical Therapy Evaluation Patient Details Name: Gabrielle Crosby MRN: [id number] DOB: 1933-02-18 Today's Date: 10/23/2019   History of Present Illness  Pt is 85 yo female with PMH of bladder cancer, brain bleeds, dementia, depression, HLD, HTN, seizures, stroke, presented to ED for nausea, vomiting, abdominal pain.    Clinical Impression  Patient easily woken, oriented to name only, demonstrated no pain signs/symptoms with session. No family at bedside to confirm PLOF, per chart patient was ambulatory in August with handheld assist. RN stated the family reported no falls and pt lives at assisted living.  The patient seen with OT for patient safety/address ADLs and mobility. Pt needed maximum tactile and verbal cues to initiate tasks and minA to complete bed mobility. Pt progressed to sitting EOB with supervision. Sit <> stand performed twice, at least modA to complete transfer and maintain upright positioning/balance. The patient was able to stand for several minutes to allow for pt care for BM. Further mobility deferred due to pt's inability to maintain balance and BM.  Overall the patient demonstrated deficits (see "PT Problem List") that impede the patient's functional abilities, safety, and mobility and would benefit from skilled PT intervention. Recommendation is SNF due to acute decline in functional status.   Of note per chart and RN, pt with R wrist fracture. Original splint thrown away due to it being soiled. Unclear weight bearing status, MD notified and plans on consulting ortho. Pt did not demonstrate any pain signs when utilize RUE. PT and OT attempted to minimize use of RUE throughout session.     Follow Up Recommendations SNF    Equipment Recommendations  Other (comment)(TBD)    Recommendations for Other Services       Precautions / Restrictions Precautions Precautions: Fall Precaution Comments: R wrist fracture Required Braces or Orthoses: Splint/Cast Splint/Cast: per  care everywhere pt should have cockup splint in place at all times Restrictions Weight Bearing Restrictions: (unclear)      Mobility  Bed Mobility Overal bed mobility: Needs Assistance Bed Mobility: Supine to Sit;Sit to Supine     Supine to sit: +2 for safety/equipment;Min assist;HOB elevated Sit to supine: Min assist;+2 for safety/equipment   General bed mobility comments: heavy tactile cues needed needed to initiate task, minA to complete  Transfers Overall transfer level: Needs assistance   Transfers: Sit to/from Stand Sit to Stand: Mod assist;+2 safety/equipment         General transfer comment: performed twice with handheld assist. Pt needed at least modA to maintain upright positioning/balance. Further ambulation deferred due to BM in standing.  Ambulation/Gait                Stairs            Wheelchair Mobility    Modified Rankin (Stroke Patients Only)       Balance Overall balance assessment: Needs assistance   Sitting balance-Leahy Scale: Fair Sitting balance - Comments: Pt progressed to sitting EOB with supervision     Standing balance-Leahy Scale: Poor                               Pertinent Vitals/Pain Pain Assessment: Faces Faces Pain Scale: No hurt    Home Living Family/patient expects to be discharged to:: Assisted living                 Additional Comments: Per chart review, pt ambulates at baseline without AD. no family at bedside  to confirm    Prior Function Level of Independence: Needs assistance         Comments: Per chart pt is at assisted living facility, pt unable to provide information on level of assistance needed     Hand Dominance        Extremity/Trunk Assessment   Upper Extremity Assessment Upper Extremity Assessment: Difficult to assess due to impaired cognition;Generalized weakness    Lower Extremity Assessment Lower Extremity Assessment: Difficult to assess due to impaired  cognition;Generalized weakness    Cervical / Trunk Assessment Cervical / Trunk Assessment: Kyphotic  Communication   Communication: Other (comment);No difficulties  Cognition Arousal/Alertness: Awake/alert Behavior During Therapy: WFL for tasks assessed/performed Overall Cognitive Status: No family/caregiver present to determine baseline cognitive functioning                                 General Comments: pt oriented to name only      General Comments      Exercises     Assessment/Plan    PT Assessment Patient needs continued PT services  PT Problem List Decreased mobility;Decreased safety awareness;Decreased balance;Decreased activity tolerance       PT Treatment Interventions DME instruction;Therapeutic exercise;Gait training;Balance training;Stair training;Neuromuscular re-education;Functional mobility training;Therapeutic activities;Patient/family education    PT Goals (Current goals can be found in the Care Plan section)  Acute Rehab PT Goals PT Goal Formulation: Patient unable to participate in goal setting Time For Goal Achievement: 11/06/19 Potential to Achieve Goals: Good    Frequency Min 2X/week   Barriers to discharge        Co-evaluation PT/OT/SLP Co-Evaluation/Treatment: Yes Reason for Co-Treatment: For patient/therapist safety;To address functional/ADL transfers PT goals addressed during session: Mobility/safety with mobility;Balance OT goals addressed during session: ADL's and self-care       AM-PAC PT "6 Clicks" Mobility  Outcome Measure Help needed turning from your back to your side while in a flat bed without using bedrails?: A Little Help needed moving from lying on your back to sitting on the side of a flat bed without using bedrails?: A Little Help needed moving to and from a bed to a chair (including a wheelchair)?: A Lot Help needed standing up from a chair using your arms (e.g., wheelchair or bedside chair)?: A Lot Help  needed to walk in hospital room?: A Lot Help needed climbing 3-5 steps with a railing? : Total 6 Click Score: 13    End of Session Equipment Utilized During Treatment: Gait belt Activity Tolerance: Other (comment)(limited by mental status, bowel movement) Patient left: in bed;with call bell/phone within reach;with nursing/sitter in room Nurse Communication: Mobility status PT Visit Diagnosis: Other abnormalities of gait and mobility (R26.89);Muscle weakness (generalized) (M62.81);Difficulty in walking, not elsewhere classified (R26.2)    Time: [id number] PT Time Calculation (min) (ACUTE ONLY): 21 min   Charges:   PT Evaluation $PT Eval Moderate Complexity: 1 Mod PT Treatments $Therapeutic Activity: 8-22 mins        Lieutenant Diego PT, DPT 10:50 AM,10/23/19 [phone]

## 2019-10-23 NOTE — Progress Notes (Signed)
Speech Language Pathology Treatment: Dysphagia  Patient Details Name: Gabrielle Crosby MRN: [id number] DOB: January 02, 1933 Today's Date: 10/23/2019 Time: [id number] SLP Time Calculation (min) (ACUTE ONLY): 40 min  Assessment / Plan / Recommendation Clinical Impression  Pt seen today for ongoing assessment of toleration of oral diet; trials to upgrade if appropriate/safe and aspiration precautions. Pt presents w/ advanced Dementia exhibiting Echolalia and frequent laughter inappropriate for ongoing conversation or question presented to her. She requires full support w/ positioning upright for any oral intake d/t her Confusion. She cannot hold Cup to feed herself w/out full support. Distractions reduced during feeding of meal.   Pt continues to present w/ oropharyngeal phase dysphagia significantly impacted by her (baseline) declined Cognitive status - pt has a baseline of advanced Dementia. She requires Mod+ cues(tactile/verbal stim) and full feeding support during po tasks, eating/drinking. W/ Mod+ support, pt appears to be able to consume a modified dysphagia diet w/out immediate, overt s/s of aspiration noted. Pt consumed boluses of Nectar liquids via tsp/cup and purees without s/s of aspiration -- no overt coughing or decline in respiratory effort during/post trials noted. Vocal quality appeared clear b/t trials when assessed. However, during trials of ice chips, pt exhibited delayed/immediate coughing -- suspect d/t overall declined awareness of the po task and min oral holding. ANY decreased awareness during eating/drinking can increase risk for choking/aspiration. During the oral phase, pt exhibited adequate bolus management and A-P transfer for swallowing then oral clearing w/ the Nectar liquids and purees. Pt required tactile/verbal cues w/ each tsp presented to engage oral prep stage; min oral transit delay noted intermittently.  For safer oral intake/swallowing, recommend continue a Dysphagia  1(Puree) diet w/ Nectar consistency liquids; Pills Crushed in Puree for safer swallowing; aspiration precautions; feeding support at all meals reducing distractions. As pt's overall medical/Cognitive status improves from this Acute illness, pt may be able to engage in trials appropriately to assess safety and appropriateness for diet upgrade(thin liquids maybe). Recommend f/u w/ such at SNF.  However, d/t her declined Cognitive status these past 2 Hospitalizations, and her overall increased risk for choking/aspiration secondary to the advanced Dementia baseline and need for full feeding support, pt may best benefit from a Dysphagia diet consistency of the above recommended from this point forward to reduce risk for aspiration and potential Pulmonary decline, and to maximize nutritional intake. Recommend Dietician f/u at SNF.       HPI HPI: Pt is a 85 y.o. year old female with multiple medical problems including seizure disorder, IBS, DVT, Alzheimer's with Vascular Dementia, bladder cancer, arthritis, hypertension, hyperlipidemia, GERD, depression, stroke. She has resided at [facility] then transitioned to [place] [facility] in recent months per chart notes. She was hospitalized 07/2019 for left facial droop. Workup significant for acute right frontal infarct with positive MRI. She was started on aspirin then. Per chart notes, pt can ambulate with supervision from staff. She does require assistance with all ADLs. She has episodes of incontinence. She is oriented to self but otherwise confused w/ limited verbal output. This has been baseline per chart notes; Palliative Care notes. On this admission, she presented to the emergency room with acute onset of bilious vomitus as well as constipation with stool impaction which have been going on over the last couple days. She was dx'd w/: Intractable nausea and vomiting and common bile duct dilatation with intra and extrahepatic biliary dilatation:  pt has constipation and fecal impaction. CT  scan showed cholecystectomy and intrahepatic and extrahepatic biliary dilatation with common  duct measuring up to 15 mm with recommendation for MRCP which is done 11/20. It showed dilated common bile duct that was also seen on her CT scan.. Any N/V can increase risk for aspiration of Regurgitated food/liquid material, and Reflux then increase risk for Pulmonary impact.       SLP Plan  Continue with current plan of care       Recommendations  Diet recommendations: Dysphagia 1 (puree);Nectar-thick liquid Liquids provided via: Cup;Teaspoon Medication Administration: Crushed with puree(for safer swallowing) Supervision: Staff to assist with self feeding;Full supervision/cueing for compensatory strategies Compensations: Minimize environmental distractions;Slow rate;Small sips/bites;Lingual sweep for clearance of pocketing;Multiple dry swallows after each bite/sip;Follow solids with liquid Postural Changes and/or Swallow Maneuvers: Seated upright 90 degrees;Upright 30-60 min after meal                General recommendations: (Dietician f/u) Oral Care Recommendations: Oral care BID;Oral care before and after PO;Staff/trained caregiver to provide oral care Follow up Recommendations: None(TBD) SLP Visit Diagnosis: Dysphagia, oropharyngeal phase (R13.12);Cognitive communication deficit (R41.841)(baseline Dementia) Plan: Continue with current plan of care       [place], [place], CCC-SLP Watson,Katherine 10/23/2019, 3:49 PM

## 2019-10-23 NOTE — Consult Note (Signed)
Reason for Consult: Right wrist injury Referring Physician: Dr. Michail Jewels is an 85 y.o. female.  HPI: Patient had a history of swelling to her right wrist 2 weeks ago.  She was seen by her family practice doctor and placed in a splint at that time.  X-rays were negative for fracture.  I was consulted because her sister that she is supposed to continue the wrist brace and she comes to the hospital without the brace on.  Patient is unable to give history no family is present but I do know the patient from prior experience.  On examination her right wrist has no swelling or erythema as she apparently did 2 weeks ago.  Past Medical History:  Diagnosis Date  . Arthritis    hands  . Bladder cancer ([place])   . Brain bleed ([place]) 2019   d/t light strokes, high bp, seizures. brain has shrunk per dr. Manuella Ghazi and mri  . Cancer ([place])    BLADDER X 2  . Dementia ([place]) 2019   dementia progressing  . Depression   . GERD (gastroesophageal reflux disease)    chokes easily even on own saliva and then has panic attack  . Hyperlipidemia   . Hypertension 2019   no medications.  no htn per daughter  . Seizures [facility])    family not aware of any seizures and no treatment  . Stroke [facility]) 2019   undiagnosed but per neuro, she has had them causing vascular dementia  . Wears dentures    partial upper and lower    Past Surgical History:  Procedure Laterality Date  . ABDOMINAL HYSTERECTOMY Bilateral    BILATERAL s&o  . BACK SURGERY    . BLADDER SURGERY N/A 2015   REMOVAL OF BLADDER CANCER X 2  . BOWEL RESECTION N/A 1999   SMALL BOWEL OBSTRUCTION  . CHOLECYSTECTOMY    . COLONOSCOPY N/A 11/06/2015   Procedure: COLONOSCOPY;  Surgeon: Hulen Luster, MD;  Location: [place];  Service: Gastroenterology;  Laterality: N/A;  . CYSTOSCOPY WITH BIOPSY N/A 04/17/2015   Procedure: CYSTOSCOPY WITH BIOPSY;  Surgeon: Hollice Espy, MD;  Location: [facility];  Service: Urology;  Laterality: N/A;  .  KYPHOPLASTY N/A 12/20/2017   Procedure: [id number];  Surgeon: Hessie Knows, MD;  Location: [facility];  Service: Orthopedics;  Laterality: N/A;  . KYPHOPLASTY N/A 04/21/2018   Procedure: KYPHOPLASTY-L2,L3,L4,L5;  Surgeon: Hessie Knows, MD;  Location: [facility];  Service: Orthopedics;  Laterality: N/A;  . SACROPLASTY N/A 11/03/2018   Procedure: Dillard Cannon;  Surgeon: Hessie Knows, MD;  Location: [facility];  Service: Orthopedics;  Laterality: N/A;    Family History  Family history unknown: Yes    Social History:  reports that she has never smoked. She has never used smokeless tobacco. She reports that she does not drink alcohol or use drugs.  Allergies:  Allergies  Allergen Reactions  . Atenolol Other (See Comments)    Bradycardia   . Donepezil Hcl Other (See Comments)    Mental changes, "made me crazy"    Medications: I have reviewed the patient's current medications.  Results for orders placed or performed during the hospital encounter of 10/19/19 (from the past 48 hour(s))  AM - CBC     Status: Abnormal   Collection Time: 10/22/19  4:47 AM  Result Value Ref Range   WBC 9.7 4.0 - 10.5 K/uL   RBC 2.82 (L) 3.87 - 5.11 MIL/uL   Hemoglobin 8.7 (L) 12.0 - 15.0  g/dL   HCT 27.1 (L) 36.0 - 46.0 %   MCV 96.1 80.0 - 100.0 fL   MCH 30.9 26.0 - 34.0 pg   MCHC 32.1 30.0 - 36.0 g/dL   RDW 13.8 11.5 - 15.5 %   Platelets 278 150 - 400 K/uL   nRBC 0.0 0.0 - 0.2 %    Comment: Performed at [facility], [address] [place], [place] [address]  AM - BMET     Status: Abnormal   Collection Time: 10/22/19  4:47 AM  Result Value Ref Range   Sodium 145 135 - 145 mmol/L   Potassium 3.2 (L) 3.5 - 5.1 mmol/L   Chloride 115 (H) 98 - 111 mmol/L   CO2 22 22 - 32 mmol/L   Glucose, Bld 96 70 - 99 mg/dL   BUN 19 8 - 23 mg/dL   Creatinine, Ser 0.55 0.44 - 1.00 mg/dL   Calcium 8.2 (L) 8.9 - 10.3 mg/dL   GFR calc non Af Amer >60 >60 mL/min   GFR calc Af Amer >60 >60 mL/min   Anion gap  8 5 - 15    Comment: Performed at [facility], [address] [place], [place] [address]  AM - Mg     Status: None   Collection Time: 10/22/19  4:47 AM  Result Value Ref Range   Magnesium 2.1 1.7 - 2.4 mg/dL    Comment: Performed at [facility], [address] [place], [place] [address]  AM - CBC     Status: Abnormal   Collection Time: 10/23/19  3:41 AM  Result Value Ref Range   WBC 7.1 4.0 - 10.5 K/uL   RBC 3.37 (L) 3.87 - 5.11 MIL/uL   Hemoglobin 10.2 (L) 12.0 - 15.0 g/dL   HCT 32.5 (L) 36.0 - 46.0 %   MCV 96.4 80.0 - 100.0 fL   MCH 30.3 26.0 - 34.0 pg   MCHC 31.4 30.0 - 36.0 g/dL   RDW 13.6 11.5 - 15.5 %   Platelets 324 150 - 400 K/uL   nRBC 0.0 0.0 - 0.2 %    Comment: Performed at [facility], [address] [place], [place] [address]  AM - BMET     Status: Abnormal   Collection Time: 10/23/19  3:41 AM  Result Value Ref Range   Sodium 145 135 - 145 mmol/L   Potassium 3.4 (L) 3.5 - 5.1 mmol/L   Chloride 113 (H) 98 - 111 mmol/L   CO2 23 22 - 32 mmol/L   Glucose, Bld 99 70 - 99 mg/dL   BUN 16 8 - 23 mg/dL   Creatinine, Ser 0.46 0.44 - 1.00 mg/dL   Calcium 8.7 (L) 8.9 - 10.3 mg/dL   GFR calc non Af Amer >60 >60 mL/min   GFR calc Af Amer >60 >60 mL/min   Anion gap 9 5 - 15    Comment: Performed at [facility], [place], [place] [address]  Troponin I (High Sensitivity)     Status: Abnormal   Collection Time: 10/23/19  8:43 AM  Result Value Ref Range   Troponin I (High Sensitivity) 32 (H) <18 ng/L    Comment: (NOTE) Elevated high sensitivity troponin I (hsTnI) values and significant  changes across serial measurements may suggest ACS but many other  chronic and acute conditions are known to elevate hsTnI results.  Refer to the "Links" section for chest pain algorithms and additional  guidance. Performed at  [place], [place] [address]   Troponin I (High Sensitivity)      Status: Abnormal   Collection Time: 10/23/19 10:48 AM  Result Value Ref Range   Troponin I (High Sensitivity) 31 (H) <18 ng/L    Comment: (NOTE) Elevated high sensitivity troponin I (hsTnI) values and significant  changes across serial measurements may suggest ACS but many other  chronic and acute conditions are known to elevate hsTnI results.  Refer to the "Links" section for chest pain algorithms and additional  guidance. Performed at [facility], [address] [place],  [address]     No results found.  ROS Blood pressure (!) 163/63, pulse 75, temperature 97.7 F (36.5 C), resp. rate 18, height 5' (1.524 m), weight 46 kg, SpO2 98 %. Physical Exam the right wrist appears normal on examination with no pain or tenderness to passive range of motion and this is compared to prior history where apparently she had painful range of motion.  Assessment/Plan: Prior wrist contusion is now resolved.  Recommend no immobilization and no limitation with the right wrist.  Hessie Knows 10/23/2019, 12:32 PM

## 2019-10-23 NOTE — TOC Initial Note (Signed)
Transition of Care [facility]) - Initial/Assessment Note    Patient Details  Name: Gabrielle Crosby MRN: [id number] Date of Birth: January 06, 1933  Transition of Care [facility]) CM/SW Contact:    Ross Ludwig, LCSW Phone Number: 10/23/2019, 5:05 PM  Clinical Narrative:                  Patient is an 85 year old female who is alert and oriented x1.  Patient is from [facility], CSW spoke to patient's daughter [name] to discuss role of CSW and process for transitioning and discharge planning.  CSW informed her that CSW will have to contact the ALF to see if they need patient to go to SNF first before she is able to return back home.  CSW attempted to contact ALF owner [facility] [phone] and also [facility], [phone] to see if they need to assess patient first, CSW left message on voice mail, awaiting for call back.  CSW was given permission to begin bed search in [facility], patient was at [facility] over the summer and she received some short term rehab before returning back to ALF.  CSW to fax required clinicals to SNFs, and will start insurance authorization.  Expected Discharge Plan: Skilled Nursing Facility Barriers to Discharge: Insurance Authorization, Continued Medical Work up   Patient Goals and CMS Choice Patient states their goals for this hospitalization and ongoing recovery are:: To return back to ALF, once she receives some short term rehab. CMS Medicare.gov Compare Post Acute Care list provided to:: Patient Choice offered to / list presented to : Adult Children  Expected Discharge Plan and Services Expected Discharge Plan: [place] In-house Referral: Clinical Social Work Discharge Planning Services: NA Post Acute Care Choice: [place] Living arrangements for the past 2 months: Assisted Living Facility([facility])                           HH Arranged: NA          Prior Living  Arrangements/Services Living arrangements for the past 2 months: Assisted Living Facility([facility]) Lives with:: Facility Resident Patient language and need for interpreter reviewed:: Yes Do you feel safe going back to the place where you live?: No   Patient needs to go to SNF for short term rehab, first before she is able to return back to ALF.  Need for Family Participation in Patient Care: Yes (Comment) Care giver support system in place?: Yes (comment)   Criminal Activity/Legal Involvement Pertinent to Current Situation/Hospitalization: No - Comment as needed  Activities of Daily Living Home Assistive Devices/Equipment: None ADL Screening (condition at time of admission) Patient's cognitive ability adequate to safely complete daily activities?: No Is the patient deaf or have difficulty hearing?: No Does the patient have difficulty seeing, even when wearing glasses/contacts?: No Does the patient have difficulty concentrating, remembering, or making decisions?: Yes Patient able to express need for assistance with ADLs?: No Does the patient have difficulty dressing or bathing?: Yes Independently performs ADLs?: No Communication: Independent Dressing (OT): Needs assistance Is this a change from baseline?: Pre-admission baseline Grooming: Needs assistance Is this a change from baseline?: Pre-admission baseline Feeding: Independent Bathing: Needs assistance Is this a change from baseline?: Pre-admission baseline Toileting: Needs assistance Is this a change from baseline?: Change from baseline, expected to last >3days In/Out Bed: Needs assistance Is this a change from baseline?: Change from baseline, expected  to last >3 days Walks in Home: Independent Does the patient have difficulty walking or climbing stairs?: No Weakness of Legs: None Weakness of Arms/Hands: None  Permission Sought/Granted Permission sought to share information with : Facility Sport and exercise psychologist,  Family Supports Permission granted to share information with : Yes, Verbal Permission Granted  Share Information with NAME: [name] Daughter [phone]  [phone]  Permission granted to share info w AGENCY: SNF and ALF admissions        Emotional Assessment Appearance:: Appears stated age   Affect (typically observed): Accepting, Calm, Stable Orientation: : Oriented to Self Alcohol / Substance Use: Not Applicable Psych Involvement: No (comment)  Admission diagnosis:  Fecal impaction (HCC) [K56.41] Abnormal findings of gastrointestinal tract [R19.8] Nausea and vomiting, intractability of vomiting not specified, unspecified vomiting type [R11.2] Aspiration pneumonia ([place]) [J69.0] Patient Active Problem List   Diagnosis Date Noted  . Intractable nausea and vomiting 10/20/2019  . Aspiration pneumonia ([place]) 10/20/2019  . Bilateral hydronephrosis 10/20/2019  . Elevated troponin 10/20/2019  . HLD (hyperlipidemia) 07/25/2019  . GERD (gastroesophageal reflux disease) 07/25/2019  . Facial droop 07/25/2019  . Loss of memory 07/26/2016  . Anxiety 06/26/2016  . Bradycardia 01/02/2015  . H/O deep venous thrombosis 01/02/2015  . MI (mitral incompetence) 01/02/2015  . Near syncope 01/02/2015  . Malignant neoplasm of urinary bladder ([place]) 08/22/2014  . Mixed Alzheimer's and vascular dementia ([place]) 08/22/2014  . Essential (primary) hypertension 08/22/2014  . Adaptive colitis 08/22/2014  . Lacunar infarction ([place]) 08/22/2014  . Combined fat and carbohydrate induced hyperlipemia 08/22/2014  . Allergic rhinitis, seasonal 08/22/2014   PCP:  Toni Arthurs, NP Pharmacy:   [id number] [place], [place] [address] [place] [address] Phone: [phone] Fax: [phone]     Social Determinants of Health (SDOH) Interventions    Readmission Risk Interventions No flowsheet data found.

## 2019-10-23 NOTE — Progress Notes (Signed)
PROGRESS NOTE    Gabrielle Crosby  [id number] DOB: Apr 01, 1933 DOA: 10/19/2019 PCP: Toni Arthurs, NP    Brief Narrative:   Gabrielle Crosby  is a 85 y.o. Caucasian female with a known history of multiple medical problems that will be mentioned below, presented to the emergency room with acute onset of bilious vomitus as well as constipation with stool impaction which have been going on over the last couple days.  She denied any fever or chills.  She has been having abdominal discomfort with constipation.  No chest pain or dyspnea or palpitations.  No melena or bright red blood per rectum or hematemesis or jaundice.  No dysuria, oliguria or hematuria or flank pain.  Upon presentation to the emergency room, blood pressure was 155/71 and later 160/58 with otherwise normal vital signs.  Labs revealed hypokalemia of 3 and magnesium level came back 1.8 with lipase levels 24 and mild anemia.  High-sensitivity troponin I was 15.  Urinalysis showed 130 glucose and 20 ketones.  Abdominal pelvic CT scan showed her cholecystectomy and intrahepatic and extrahepatic biliary dilatation with common duct measuring up to 15 mm with recommendation for MRCP, slight bilateral hydronephrosis that may be due to the urinary bladder distention and sigmoid diverticulosis with large volume of stool in the colon and rectum and small right greater than left pleural effusions with partial consolidation and adjacent groundglass density in the lower lobes reflecting atelectasis or pneumonia with no acute abdominal or pelvic abnormalities.  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  EKG showed normal sinus rhythm with a rate of 81 with left atrial enlargement.  The patient was given 1 L bolus of IV normal saline as well as 4 mg of IV Zofran twice and 10 leucovorin IV potassium chloride.  She was  disimpacted by the ER physician.  She will be admitted to medical monitored bed for further evaluation and management.   Interim History: -11/20: MRCP  1. Moderate biliary dilatation status post cholecystectomy. No evidence of choledocholithiasis. Given similar appearance on  previous lumbar CT and normal liver function studies, this is probably physiologic.  2. Mild pancreatic ductal dilatation without evidence of pancreatic mass or pancreas divisum.  3. Bilateral pleural effusions with associated atelectasis at both lung bases.  4. Calcified left-sided disc protrusion at T10-11.  -GI consulted, due to unremarkable MRCP findings, recommended laxative treatment  -11/21: WBC no improvement, 17.7 -->17.7. will change Abx from Unasyn and azithromycin to vancomycin and unasyn - 11/23: WBC improved 17.7 -->9.7 -->7.1  - 11/23: consulted ortho for recent right wrist injury - 11/23: d/c'ed vanco and continue unasyn   Subjective: Pt has dementia, only gives limited information.  Patient seems to be comfortable today. No active nausea, vomiting, diarrhea noted.  Does not seem to have chest pain or AP.  Assessment & Plan:   Principal Problem:   Intractable nausea and vomiting Active Problems:   Malignant neoplasm of urinary bladder (HCC)   Mixed Alzheimer's and vascular dementia ([place])   Essential (primary) hypertension   HLD (hyperlipidemia)   GERD (gastroesophageal reflux disease)   Aspiration pneumonia (HCC)   Bilateral hydronephrosis   Elevated troponin  Intractable nausea and vomiting and common bile duct dilatation with intra and extrahepatic biliary dilatation:  pt has constipation and fecal impaction. CT scan showed cholecystectomy and intrahepatic and extrahepatic biliary dilatation with common duct  measuring up to 15 mm with recommendation for MRCP which is done 11/20. It showed dilated common bile duct that was also seen on her CT scan.  The radiologist had reported that the  dilated not changed from the prior imaging.   GI, Dr. Allen Norris was consulted and highly appreciated his consultation. He recommended "The patient should be treated with laxatives to continue to help her clear out her bowels in case this is one of the reasons she is having her nausea and vomiting.  If the patient continues to have nausea and vomiting she may need an upper endoscopy to evaluate her esophagus and stomach to make sure she does not have an obstructive lesion". Dr. Marius Ditch saw pt 11/21 and 11/22, which is highly appreciated. She recommended to continue bowel regimen.  -Continue Dulcolax suppository as needed as well as oral MiraLAX daily - dys 1 diet. - prn zofran  Suspected aspiration pneumonia with bilateral pleural effusion: Leukocytosis improved. SLP consulted, recommended "Dysphagive level 1 (puree) w/ Nectar consistency liquids; aspiration precautions; feeding support at all meals. Reduce distractions. Medication Administration: Crushed with puree".  WBC 7.1 and no fever. Infection has improved. - f/u Bx - IV Unasyn and Zithromax --> changed to vancomycin and unasyn 11/21 -->d/ced vancomycine - sputum Gram stain culture  CBC Latest Ref Rng & Units 10/23/2019 10/22/2019 10/21/2019  WBC 4.0 - 10.5 K/uL 7.1 9.7 17.7(H)  Hemoglobin 12.0 - 15.0 g/dL 10.2(L) 8.7(L) 10.6(L)  Hematocrit 36.0 - 46.0 % 32.5(L) 27.1(L) 33.0(L)  Platelets 150 - 400 K/uL 324 278 461(H)   Hypokalemia: k 3.4. -repleted  Depression.   -Celexa.  Uncontrolled hypertension.   -We will continue Zestril and place the patient on as needed IV labetalol.  Dementia with behavioral changes.   -Continue Namenda and Seroquel.  Dyslipidemia.   -Statin  Elevated trop: trop 15 -->24 --> 70 --> 80. No CP, possible demand ischemia. -ASA and statin  Bilateral hydronephrosis and hx of malignant neoplasm of urinary bladder: CT scan showed slight bilateral hydronephrosis that may be due to the urinary bladder  distention and sigmoid diverticulosis with large volume of stool in the colon and rectum. Cre normal 0.52 and BUN 9 -will place Foley in  Recent right wrist injury: orhto, Dr. Kyla Balzarine was consulted. Per Dr. Kyla Balzarine, "Prior wrist contusion is now resolved.  Recommend no immobilization and no limitation with the right wrist". -monitoring    DVT prophylaxis: sq Heparin Code Status: DNR per her daughter 11/21 Family Communication: not today Disposition Plan: Not ready for discharge.  Pt has multiple complicated presentations. Still need IV antibiotics. Possible d/c tomorrow. Will order Covid 19 test.  Consultants:  -GI -ortho  Procedures:  Pending MRCP  Antimicrobials: Anti-infectives (From admission, onward)   Start     Dose/Rate Route Frequency Ordered Stop   10/21/19 1800  Ampicillin-Sulbactam (UNASYN) 3 g in sodium chloride 0.9 % 100 mL IVPB     3 g 200 mL/hr over 30 Minutes Intravenous Every 6 hours 10/21/19 1224     10/21/19 1300  vancomycin (VANCOCIN) 1,250 mg in sodium chloride 0.9 % 250 mL IVPB  Status:  Discontinued     1,250 mg 166.7 mL/hr over 90 Minutes Intravenous Every 48 hours 10/21/19 1223 10/21/19 1227   10/21/19 1300  Vancomycin (VANCOCIN) 1,250 mg in sodium chloride 0.9 % 250 mL IVPB  Status:  Discontinued     1,250 mg 166.7 mL/hr over 90 Minutes Intravenous Every 48 hours 10/21/19 1227 10/23/19 1007   10/20/19  0645  Ampicillin-Sulbactam (UNASYN) 3 g in sodium chloride 0.9 % 100 mL IVPB  Status:  Discontinued     3 g 200 mL/hr over 30 Minutes Intravenous Every 6 hours 10/20/19 0639 10/21/19 1153   10/20/19 0645  azithromycin (ZITHROMAX) 500 mg in sodium chloride 0.9 % 250 mL IVPB  Status:  Discontinued     500 mg 250 mL/hr over 60 Minutes Intravenous Every 24 hours 10/20/19 0639 10/21/19 1153          Objective: Vitals:   10/22/19 2023 10/23/19 0445 10/23/19 1128 10/23/19 2040  BP: (!) 169/78 (!) 182/91 (!) 163/63 (!) 163/78  Pulse: 97 76 75 86  Resp:     18  Temp: 98.2 F (36.8 C) 97.6 F (36.4 C) 97.7 F (36.5 C) 98.5 F (36.9 C)  TempSrc: Oral Oral  Oral  SpO2: 96% 99% 98% 97%  Weight:      Height:        Intake/Output Summary (Last 24 hours) at 10/23/2019 2111 Last data filed at 10/23/2019 1800 Gross per 24 hour  Intake 2088.39 ml  Output 1225 ml  Net 863.39 ml   Filed Weights   10/19/19 1520  Weight: 46 kg    Examination:  Physical Exam:  General: Not in acute distress HEENT: PERRL, EOMI, no scleral icterus, No JVD or bruit Cardiac: S1/S2, RRR, No murmurs, gallops or rubs Pulm: has rhonchi bilaterally.  Abd: Soft, nondistended, has tenderness in epigastric area, no rebound pain, no organomegaly, BS present Ext: No edema. 2+DP/PT pulse bilaterally Musculoskeletal: No joint deformities, erythema, or stiffness, ROM full Skin: No rashes.  Neuro: Alert, cranial nerves II-XII grossly intact, moves all extremeties. Psych: Patient is not psychotic, no suicidal or hemocidal ideation.    Data Reviewed: I have personally reviewed following labs and imaging studies  CBC: Recent Labs  Lab 10/19/19 1538 10/20/19 0548 10/21/19 0631 10/22/19 0447 10/23/19 0341  WBC 9.3 17.7* 17.7* 9.7 7.1  HGB 11.2* 11.5* 10.6* 8.7* 10.2*  HCT 33.4* 34.5* 33.0* 27.1* 32.5*  MCV 91.3 92.7 94.6 96.1 96.4  PLT 459* 506* 461* 278 0000000   Basic Metabolic Panel: Recent Labs  Lab 10/19/19 1538 10/19/19 1716 10/20/19 0548 10/21/19 0631 10/22/19 0447 10/23/19 0341  NA 139  --  138 139 145 145  K 3.0*  --  3.3* 4.0 3.2* 3.4*  CL 104  --  104 109 115* 113*  CO2 22  --  22 18* 22 23  GLUCOSE 155*  --  165* 119* 96 99  BUN 11  --  9 12 19 16   CREATININE 0.59  --  0.52 0.38* 0.55 0.46  CALCIUM 8.7*  --  8.6* 8.4* 8.2* 8.7*  MG  --  1.8  --   --  2.1  --    GFR: Estimated Creatinine Clearance: 38.3 mL/min (by C-G formula based on SCr of 0.46 mg/dL). Liver Function Tests: Recent Labs  Lab 10/19/19 1538  AST 23  ALT 13  ALKPHOS 64   BILITOT 0.7  PROT 7.1  ALBUMIN 3.6   Recent Labs  Lab 10/19/19 1538  LIPASE 24   No results for input(s): AMMONIA in the last 168 hours. Coagulation Profile: No results for input(s): INR, PROTIME in the last 168 hours. Cardiac Enzymes: No results for input(s): CKTOTAL, CKMB, CKMBINDEX, TROPONINI in the last 168 hours. BNP (last 3 results) No results for input(s): PROBNP in the last 8760 hours. HbA1C: No results for input(s): HGBA1C in the last 72  hours. CBG: No results for input(s): GLUCAP in the last 168 hours. Lipid Profile: No results for input(s): CHOL, HDL, LDLCALC, TRIG, CHOLHDL, LDLDIRECT in the last 72 hours. Thyroid Function Tests: No results for input(s): TSH, T4TOTAL, FREET4, T3FREE, THYROIDAB in the last 72 hours. Anemia Panel: No results for input(s): VITAMINB12, FOLATE, FERRITIN, TIBC, IRON, RETICCTPCT in the last 72 hours. Sepsis Labs: No results for input(s): PROCALCITON, LATICACIDVEN in the last 168 hours.  Recent Results (from the past 240 hour(s))  SARS CORONAVIRUS 2 (TAT 6-24 HRS) Nasopharyngeal Nasopharyngeal Swab     Status: None   Collection Time: 10/20/19  1:43 AM   Specimen: Nasopharyngeal Swab  Result Value Ref Range Status   SARS Coronavirus 2 NEGATIVE NEGATIVE Final    Comment: (NOTE) SARS-CoV-2 target nucleic acids are NOT DETECTED. The SARS-CoV-2 RNA is generally detectable in upper and lower respiratory specimens during the acute phase of infection. Negative results do not preclude SARS-CoV-2 infection, do not rule out co-infections with other pathogens, and should not be used as the sole basis for treatment or other patient management decisions. Negative results must be combined with clinical observations, patient history, and epidemiological information. The expected result is Negative. Fact Sheet for Patients: SugarRoll.be Fact Sheet for Healthcare  Providers: https://www.woods-mathews.com/ This test is not yet approved or cleared by the [place] FDA and  has been authorized for detection and/or diagnosis of SARS-CoV-2 by FDA under an Emergency Use Authorization (EUA). This EUA will remain  in effect (meaning this test can be used) for the duration of the COVID-19 declaration under Section 56 4(b)(1) of the Act, 21 U.S.C. section 360bbb-3(b)(1), unless the authorization is terminated or revoked sooner. Performed at [facility], [place] [address], [place], [place] [address]   CULTURE, BLOOD (ROUTINE X 2) w Reflex to ID Panel     Status: None (Preliminary result)   Collection Time: 10/20/19  9:40 AM   Specimen: BLOOD  Result Value Ref Range Status   Specimen Description BLOOD RIGHT ANTECUBITAL  Final   Special Requests   Final    BOTTLES DRAWN AEROBIC AND ANAEROBIC Blood Culture results may not be optimal due to an inadequate volume of blood received in culture bottles   Culture   Final    NO GROWTH 3 DAYS Performed at [facility], [address] [place], [place] [address]    Report Status PENDING  Incomplete  CULTURE, BLOOD (ROUTINE X 2) w Reflex to ID Panel     Status: None (Preliminary result)   Collection Time: 10/20/19  9:40 AM   Specimen: BLOOD  Result Value Ref Range Status   Specimen Description BLOOD BLOOD RIGHT FOREARM  Final   Special Requests   Final    BOTTLES DRAWN AEROBIC AND ANAEROBIC Blood Culture adequate volume   Culture   Final    NO GROWTH 3 DAYS Performed at [facility], [address], [place], [place] [address]    Report Status PENDING  Incomplete  MRSA PCR Screening     Status: None   Collection Time: 10/23/19  7:41 PM   Specimen: Nasal Mucosa; Nasopharyngeal  Result Value Ref Range Status   MRSA by PCR NEGATIVE NEGATIVE Final    Comment:        The GeneXpert MRSA Assay (FDA approved for NASAL specimens only), is one component of a comprehensive MRSA  colonization surveillance program. It is not intended to diagnose MRSA infection nor to guide or monitor treatment for MRSA  infections. Performed at [facility], [address] [place], [place] [address]        Radiology Studies: No results found.      Scheduled Meds: . aspirin  81 mg Oral Daily  . bisacodyl  10 mg Rectal Daily  . Chlorhexidine Gluconate Cloth  6 each Topical Daily  . cholecalciferol  2,000 Units Oral Daily  . citalopram  20 mg Oral Daily  . feeding supplement (ENSURE ENLIVE)  237 mL Oral BID BM  . guaiFENesin  10 mL Oral Q4H while awake  . heparin injection (subcutaneous)  5,000 Units Subcutaneous Q8H  . lisinopril  5 mg Oral Daily  . magnesium oxide  200 mg Oral Daily  . memantine  10 mg Oral BID  . polyethylene glycol  17 g Oral Daily  . pravastatin  20 mg Oral Daily  . QUEtiapine  25 mg Oral q morning - 10a   And  . QUEtiapine  50 mg Oral QHS  . sodium chloride flush  3 mL Intravenous Once  . traZODone  50 mg Oral QHS  . vitamin B-12  1,000 mcg Oral Daily   Continuous Infusions: . sodium chloride 1,000 mL (10/23/19 2015)  . ampicillin-sulbactam (UNASYN) IV 3 g (10/23/19 2016)  . lactated ringers Stopped (10/23/19 1548)     LOS: 3 days    Time spent: 52 min     Ivor Costa, DO [facility] PAGER is on [place]  If 7PM-7AM, please contact night-coverage [email or web address] Password [facility] 10/23/2019, 9:11 PM

## 2019-10-23 NOTE — TOC Progression Note (Signed)
Transition of Care [facility]) - Progression Note    Patient Details  Name: Gabrielle Crosby MRN: [id number] Date of Birth: 1933-09-04  Transition of Care [facility]) CM/SW Contact  Gabrielle Crosby, [name] Phone Number: 10/23/2019, 4:15 PM  Clinical Narrative:     CSW attempted to contact patient's daughter Gabrielle Crosby [phone] to see if she has the phone number of the group home.  CSW left a message awaiting for call back.  Gabrielle Crosby. Gabrielle Crosby, MSW, Gabrielle Crosby [phone]  10/23/2019 4:16 PM          Expected Discharge Plan and Services                                                 Social Determinants of Health (SDOH) Interventions    Readmission Risk Interventions No flowsheet data found.

## 2019-10-23 NOTE — Care Management Important Message (Signed)
Important Message  Patient Details  Name: Gabrielle Crosby MRN: [id number] Date of Birth: 1933/07/10   Medicare Important Message Given:  Yes     [name] 10/23/2019, 10:44 AM

## 2019-10-24 DIAGNOSIS — R198 Other specified symptoms and signs involving the digestive system and abdomen: Secondary | ICD-10-CM

## 2019-10-24 LAB — BASIC METABOLIC PANEL
Anion gap: 8 (ref 5–15)
BUN: 12 mg/dL (ref 8–23)
CO2: 22 mmol/L (ref 22–32)
Calcium: 8.1 mg/dL — ABNORMAL LOW (ref 8.9–10.3)
Chloride: 110 mmol/L (ref 98–111)
Creatinine, Ser: 0.51 mg/dL (ref 0.44–1.00)
GFR calc Af Amer: >60 mL/min (ref >60)
GFR calc non Af Amer: >60 mL/min (ref >60)
Glucose, Bld: 100 mg/dL — ABNORMAL HIGH (ref 70–99)
Potassium: 3.4 mmol/L — ABNORMAL LOW (ref 3.5–5.1)
Sodium: 140 mmol/L (ref 135–145)

## 2019-10-24 LAB — SARS CORONAVIRUS 2 (TAT 6-24 HRS): SARS Coronavirus 2: NEGATIVE

## 2019-10-24 LAB — CBC
HCT: 31.3 % — ABNORMAL LOW (ref 36.0–46.0)
Hemoglobin: 10.5 g/dL — ABNORMAL LOW (ref 12.0–15.0)
MCH: 30.3 pg (ref 26.0–34.0)
MCHC: 33.5 g/dL (ref 30.0–36.0)
MCV: 90.5 fL (ref 80.0–100.0)
Platelets: 311 10*3/uL (ref 150–400)
RBC: 3.46 MIL/uL — ABNORMAL LOW (ref 3.87–5.11)
RDW: 13.4 % (ref 11.5–15.5)
WBC: 7.5 10*3/uL (ref 4.0–10.5)
nRBC: 0 % (ref 0.0–0.2)

## 2019-10-24 LAB — MAGNESIUM: Magnesium: 2 mg/dL (ref 1.7–2.4)

## 2019-10-24 MED ORDER — POTASSIUM CHLORIDE CRYS ER 20 MEQ PO TBCR
40.0000 meq | EXTENDED_RELEASE_TABLET | Freq: Once | ORAL | Status: AC
  Administered 2019-10-24: 40 meq via ORAL
  Filled 2019-10-24: qty 2

## 2019-10-24 MED ORDER — POLYETHYLENE GLYCOL 3350 17 G PO PACK: 17.0000 g | PACK | Freq: Every day | ORAL | 1 refills | Status: DC

## 2019-10-24 MED ORDER — ONDANSETRON HCL 4 MG PO TABS: 4.0000 mg | ORAL_TABLET | Freq: Every day | ORAL | 0 refills | Status: AC | PRN

## 2019-10-24 MED ORDER — ENSURE ENLIVE PO LIQD: 237.0000 mL | Freq: Two times a day (BID) | ORAL | 0 refills | Status: AC

## 2019-10-24 MED ORDER — BISACODYL 10 MG RE SUPP: 10.0000 mg | Freq: Every day | RECTAL | 1 refills | Status: AC

## 2019-10-24 NOTE — TOC Progression Note (Signed)
Transition of Care [facility]) - Progression Note    Patient Details  Name: Gabrielle Crosby MRN: [id number] Date of Birth: Dec 16, 1932  Transition of Care [facility]) CM/SW Contact  Ross Ludwig, [place] Phone Number: 10/24/2019, 2:59 PM  Clinical Narrative:    CSW started insurance authorization, patient's daughter chose bed at [facility] in [place], [place].  [facility] continuing to follow patient's progress throughout discharge planning.   Expected Discharge Plan: Skilled Nursing Facility Barriers to Discharge: Ship broker, Continued Medical Work up  Expected Discharge Plan and Services Expected Discharge Plan: [place] In-house Referral: Clinical Social Work Discharge Planning Services: NA Post Acute Care Choice: [place] Living arrangements for the past 2 months: Assisted Living Facility([facility])                           HH Arranged: NA           Social Determinants of Health (SDOH) Interventions    Readmission Risk Interventions No flowsheet data found.

## 2019-10-24 NOTE — Discharge Summary (Addendum)
Physician Discharge Summary  Gabrielle Crosby [id number] DOB: [birth year] DOA: 10/19/2019  PCP: Toni Arthurs, NP  Admit date: 10/19/2019 Discharge date: 10/25/2019  Recommendations for Outpatient Follow-up:  1. Follow up with PCP in 2 days and follow up with gastroenterologist 2. Please obtain BMP/CBC in one week   Home Health: none Equipment/Devices: none  Discharge Condition: stable CODE STATUS:  DNR Diet recommendation:  Diet recommendations: Dysphagia 1(puree);Nectar-thick liquid Liquids provided via: Cup;Teaspoon Medication Administration: Crushed with puree(for safer swallowing) Supervision: Staff to assist with self feeding;Full supervision/cueing for compensatory strategies Compensations: Minimize environmental distractions;Slow rate;Small sips/bites;Lingual sweep for clearance of pocketing;Multiple dry swallows after each bite/sip;Follow solids with liquid Postural Changes and/or Swallow Maneuvers: Seated upright 90 degrees;Upright 30-60 min after meal        Brief/Interim Summary (HPI)  Gabrielle Crosby  is a 85 y.o. Caucasian female with a known history of multiple medical problems that will be mentioned below, presented to the emergency room with acute onset of bilious vomitus as well as constipation with stool impaction which have been going on over the last couple days.  She denied any fever or chills.  She has been having abdominal discomfort with constipation.  No chest pain or dyspnea or palpitations.  No melena or bright red blood per rectum or hematemesis or jaundice.  No dysuria, oliguria or hematuria or flank pain.  Upon presentation to the emergency room, blood pressure was 155/71 and later 160/58 with otherwise normal vital signs.  Labs revealed hypokalemia of 3 and magnesium level came back 1.8 with lipase levels 24 and mild anemia.  High-sensitivity troponin I was 15.  Urinalysis showed 130 glucose and 20 ketones.  Abdominal pelvic CT scan showed her  cholecystectomy and intrahepatic and extrahepatic biliary dilatation with common duct measuring up to 15 mm with recommendation for MRCP, slight bilateral hydronephrosis that may be due to the urinary bladder distention and sigmoid diverticulosis with large volume of stool in the colon and rectum and small right greater than left pleural effusions with partial consolidation and adjacent groundglass density in the lower lobes reflecting atelectasis or pneumonia with no acute abdominal or pelvic abnormalities.  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  EKG showed normal sinus rhythm with a rate of 81 with left atrial enlargement.  The patient was given 1 L bolus of IV normal saline as well as 4 mg of IV Zofran twice and 10 leucovorin IV potassium chloride.  She was disimpacted by the ER physician.  She will be admitted to medical monitored bed for further evaluation and management.  Subjective   Pt has dementia, only gives very limited information.  Patient seems to be comfortable today. No active nausea, vomiting, diarrhea noted.  Does not seem to have chest pain or AP.    Discharge Diagnoses and Hospital Course:   Principal Problem:   Intractable nausea and vomiting Active Problems:   Malignant neoplasm of urinary bladder (HCC)   Mixed Alzheimer's and vascular dementia ([place])   Essential (primary) hypertension   HLD (hyperlipidemia)   GERD (gastroesophageal reflux disease)   Aspiration pneumonia (HCC)   Bilateral hydronephrosis   Elevated troponin   Abnormal findings of gastrointestinal tract  Intractable nausea and vomiting and common bile duct dilatation with intra and extrahepatic biliary dilatation:  pt has constipation and fecal impaction. CT scan showed cholecystectomy and intrahepatic and extrahepatic biliary dilatation with  common duct measuring up to 15  mm with recommendation for MRCP which is done 11/20. It showed dilated common bile duct that was also seen onherCT scan. The radiologist had reported that the dilated not changed from the prior imaging.   GI was consulted and highly appreciated his consultation. He recommended "The patient should be treated with laxatives to continue to help her clear out her bowels in case this is one of the reasons she is having her nausea and vomiting. If the patient continues to have nausea and vomiting she may need an upper endoscopy to evaluate her esophagus and stomach to make sure she does not have an obstructive lesion". Dr. Marius Ditch saw pt 11/21 and 11/22, which is highly appreciated. She recommended to continuebowel regimen.  -Continue Dulcolax suppository as needed as well as oralMiraLAX daily - dys 1 diet. - prn zofran  Suspected aspiration pneumonia with bilateral pleural effusion: Leukocytosis improved. SLP consulted, recommended "Dysphagive level 1 (puree) w/ Nectar consistency liquids; aspiration precautions; feeding support at all meals. Reduce distractions.Medication Administration: Crushed with puree".  WBC 7.5 and no fever. Blood culture has no growth so far. - Treated with IV Unasyn and Zithromax --> changed to vancomycin and unasyn 11/21 -->d/c'ed vancomycine on 11/23 -->pt finished 5 days of antibiotics. No need to continue antibiotics at discharge.  Hypokalemia: -repleted  Depression. -Celexa.  Uncontrolled hypertension.  -will continue home meds  Dementia with behavioral changes. -Continue Namenda and Seroquel.  Dyslipidemia. -Statin  Elevated trop: trop 15 -->24 --> 70 --> 80 -->30 -->31. No CP, possible demand ischemia. -ASA and statin  Bilateral hydronephrosis and hx of malignant neoplasm of urinary bladder: CT scan showedslight bilateral hydronephrosis that may be due to the urinary bladder distention and sigmoid diverticulosis with large volume of stool  in the colon and rectum. Cre normal 0.52 and BUN 9 -will place Foley in hospital  Recent right wrist injury: orhto, Dr. Kyla Balzarine was consulted. Per Dr. Kyla Balzarine, "Prior wrist contusion is now resolved. Recommend no immobilization and no limitation with the right wrist". -monitored in hospital  Discharge Instructions  Discharge Instructions    Call MD for:  difficulty breathing, headache or visual disturbances   Complete by: As directed    Call MD for:  persistant nausea and vomiting   Complete by: As directed    Call MD for:  severe uncontrolled pain   Complete by: As directed    Call MD for:  temperature >100.4   Complete by: As directed    Increase activity slowly   Complete by: As directed      Allergies as of 10/25/2019      Reactions   Atenolol Other (See Comments)   Bradycardia    Donepezil Hcl Other (See Comments)   Mental changes, "made me crazy"      Medication List    STOP taking these medications   atorvastatin 40 MG tablet Commonly known as: LIPITOR   ibuprofen 600 MG tablet Commonly known as: ADVIL   sulfamethoxazole-trimethoprim 800-160 MG tablet Commonly known as: BACTRIM DS     TAKE these medications   acetaminophen 500 MG tablet Commonly known as: TYLENOL Take 500 mg by mouth 2 (two) times daily.   aspirin EC 81 MG tablet Take 81 mg by mouth daily. What changed: Another medication with the same name was removed. Continue taking this medication, and follow the directions you see here.   bisacodyl 10 MG suppository Commonly known as: DULCOLAX Place 1 suppository (10 mg total)  rectally daily.   citalopram 20 MG tablet Commonly known as: CELEXA Take 20 mg by mouth daily.   docusate sodium 50 MG capsule Commonly known as: COLACE Take 50 mg by mouth 2 (two) times daily as needed for mild constipation.   feeding supplement (ENSURE ENLIVE) Liqd Take 237 mLs by mouth 2 (two) times daily between meals.   lisinopril 5 MG tablet Commonly known as:  ZESTRIL Take 1 tablet (5 mg total) by mouth daily.   lovastatin 20 MG tablet Commonly known as: MEVACOR Take 20 mg by mouth at bedtime.   Magnesium 100 MG Caps Take 100 mg by mouth daily.   memantine 10 MG tablet Commonly known as: NAMENDA Take 10 mg by mouth 2 (two) times daily.   ondansetron 4 MG tablet Commonly known as: Zofran Take 1 tablet (4 mg total) by mouth daily as needed for nausea or vomiting.   polyethylene glycol 17 g packet Commonly known as: MIRALAX / GLYCOLAX Take 17 g by mouth daily.   QUEtiapine 25 MG tablet Commonly known as: SEROQUEL Take 50 mg by mouth 2 (two) times daily. 25 mg qam  50 mg qhs   traZODone 50 MG tablet Commonly known as: DESYREL Take 50 mg by mouth at bedtime.   vitamin B-12 1000 MCG tablet Commonly known as: CYANOCOBALAMIN Take 1,000 mcg by mouth daily.   Vitamin D3 50 MCG (2000 UT) capsule Take 2,000 Units by mouth daily.       Contact information for follow-up providers    Toni Arthurs, NP Follow up in 2 day(s).   Specialty: Family Medicine Contact information: [address] [place] [address] [phone]        Lin Landsman, MD Follow up in 2 week(s).   Specialty: Gastroenterology Contact information: [place] [address] [phone]            Contact information for after-discharge care    [facility] .   Service: Skilled Nursing Contact information: [place] [phone]                 Allergies  Allergen Reactions  . Atenolol Other (See Comments)    Bradycardia   . Donepezil Hcl Other (See Comments)    Mental changes, "made me crazy"    Consultations:  GI  ortho   Procedures/Studies: Ct Head Wo Contrast  Result Date: 10/19/2019 CLINICAL DATA:  Unexplained altered level of consciousness. Vomiting. EXAM: CT HEAD WITHOUT CONTRAST TECHNIQUE: Contiguous axial  images were obtained from the base of the skull through the vertex without intravenous contrast. COMPARISON:  MRI 07/26/2019 FINDINGS: Brain: No sign of acute infarction by CT. No brainstem or cerebellar abnormality is seen. Old left occipital infarction. Old right frontal infarction which was acute in August of this year. Small old left frontal cortical infarction. Chronic small-vessel ischemic changes of the white matter as seen previously. No mass lesion, hemorrhage, hydrocephalus or extra-axial collection. Vascular: There is atherosclerotic calcification of the major vessels at the base of the brain. Skull: Negative Sinuses/Orbits: Clear/normal Other: None IMPRESSION: No acute finding by CT. Atrophy and chronic small-vessel ischemic changes. 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 Small old left frontal infarction as well. Electronically Signed   By: Nelson Chimes M.D.   On: 10/19/2019 18:02   Ct Abdomen Pelvis W Contrast  Result Date: 10/19/2019 CLINICAL DATA:  Abdomen distension with vomiting EXAM: CT ABDOMEN  AND PELVIS WITH CONTRAST TECHNIQUE: Multidetector CT imaging of the abdomen and pelvis was performed using the standard protocol following bolus administration of intravenous contrast. CONTRAST:  88mL OMNIPAQUE IOHEXOL 300 MG/ML  SOLN COMPARISON:  CT lumbar spine 10/15/2018 FINDINGS: Lower chest: Small right greater than left pleural effusions at the lung bases. Partial consolidation and adjacent ground-glass density in the right greater than left lower lobes. Heart size within normal limits. Coronary vascular calcification. Moderate hiatal hernia. Hepatobiliary: Status post cholecystectomy. Mild intra hepatic and moderate extrahepatic biliary dilatation with common bile duct measuring up to 15 mm. Pancreas: Unremarkable. No pancreatic ductal dilatation or surrounding inflammatory changes. Spleen: Normal in size without focal abnormality. Adrenals/Urinary Tract: Adrenal glands are normal.  Cysts in the right kidney. Mild bilateral hydronephrosis and prominence of the ureters. Very distended urinary bladder Stomach/Bowel: Stomach is within normal limits. Large amount of stool in the colon and rectum. Sigmoid colon diverticular disease without acute inflammatory change no evidence of bowel wall thickening, distention, or inflammatory changes. Vascular/Lymphatic: Moderate aortic atherosclerosis without aneurysm. No significantly enlarged lymph nodes Reproductive: Status post hysterectomy. No adnexal masses. Other: Negative for free air or free fluid. Musculoskeletal: No acute osseous abnormality. Prior bilateral sacral plasty. Vertebral augmentation L1 through L5 with chronic appearing fracture deformities. No definite acute osseous abnormality IMPRESSION: 1. No CT evidence for acute intra-abdominal or pelvic abnormality. 2. Small right greater than left pleural effusions with partial consolidations and adjacent ground-glass density in the lower lobes either reflecting atelectasis or pneumonia 3. Status post cholecystectomy. Intra and extrahepatic biliary dilatation with common duct measuring up to 15 mm. Recommend correlation with LFTs with follow-up MRCP if clinically indicated 4. Slight hydronephrosis bilaterally, which may be secondary to urinary bladder distention 5. Sigmoid colon diverticular disease without acute inflammatory process. Large volume of stool in the colon and rectum Electronically Signed   By: Donavan Foil M.D.   On: 10/19/2019 18:11   Mr 3d Recon At Scanner  Result Date: 10/20/2019 CLINICAL DATA:  Unintentional weight loss. Nonlocalized abdominal pain. Biliary dilatation on abdominal CT. EXAM: MRI ABDOMEN WITHOUT AND WITH CONTRAST (INCLUDING MRCP) TECHNIQUE: Multiplanar multisequence MR imaging of the abdomen was performed both before and after the administration of intravenous contrast. Heavily T2-weighted images of the biliary and pancreatic ducts were obtained, and  three-dimensional MRCP images were rendered by post processing. CONTRAST:  66mL GADAVIST GADOBUTROL 1 MMOL/ML IV SOLN COMPARISON:  Abdominopelvic CT 10/19/2019. Lumbar spine CT 10/15/2018. FINDINGS: Despite efforts by the technologist and patient, mild motion artifact is present on today's exam and could not be eliminated. This reduces exam sensitivity and specificity. Several sequences were repeated. Lower chest: There are dependent bilateral pleural effusions with associated atelectasis at both lung bases. Hepatobiliary: The liver is normal in signal without focal abnormality or abnormal enhancement. Patient is status post cholecystectomy. As seen on recent CT, there is moderate intrahepatic and extrahepatic biliary dilatation. The common hepatic duct measures up to 13 mm in diameter. Although detail is mildly limited by the motion, there is no evidence of choledocholithiasis. The biliary dilatation appears similar to the prior lumbar spine CT and is likely physiologic in this patient without elevated liver function studies on testing done yesterday. Pancreas: The pancreatic duct is mildly dilated to 4 mm. No evidence of pancreatic mass, pancreas divisum or surrounding inflammation. Spleen: Normal in size without focal abnormality. Adrenals/Urinary Tract: Both adrenal glands appear normal. There are small bilateral renal cysts. The collecting system dilatation seen on yesterday's CT is  improved, likely due to improved bladder distention. The bladder remains mildly distended on the coronal images. Stomach/Bowel: No evidence of bowel wall thickening, distention or surrounding inflammatory change. Vascular/Lymphatic: There are no enlarged abdominal lymph nodes. Diffuse aortic and branch vessel atherosclerosis. Other: No ascites or focal extraluminal fluid collection. Musculoskeletal: Grossly stable compression deformities throughout the lumbar spine status post multilevel spinal augmentation. There is T2  hyperintensity within the sacral ala bilaterally status post sacroplasty. There is a left-sided disc protrusion at T10-11 which causes mild cord flattening, calcified on CT. IMPRESSION: 1. Moderate biliary dilatation status post cholecystectomy. No evidence of choledocholithiasis. Given similar appearance on previous lumbar CT and normal liver function studies, this is probably physiologic. 2. Mild pancreatic ductal dilatation without evidence of pancreatic mass or pancreas divisum. 3. Bilateral pleural effusions with associated atelectasis at both lung bases. 4. Calcified left-sided disc protrusion at T10-11. Electronically Signed   By: Richardean Sale M.D.   On: 10/20/2019 13:55   Dg Chest Port 1 View  Result Date: 10/21/2019 CLINICAL DATA:  Aspiration. EXAM: PORTABLE CHEST 1 VIEW COMPARISON:  05/02/2014 FINDINGS: 0913 hours. Cardiopericardial silhouette is at upper limits of normal for size. There is pulmonary vascular congestion without overt pulmonary edema. Interstitial markings are diffusely coarsened with chronic features. Bibasilar atelectasis/infiltrate with tiny left pleural effusion. Bones are diffusely demineralized. Telemetry leads overlie the chest. IMPRESSION: Bibasilar atelectasis/infiltrate with small left pleural effusion. Electronically Signed   By: Misty Stanley M.D.   On: 10/21/2019 11:15   Mr Abdomen Mrcp Moise Boring Contast  Result Date: 10/20/2019 CLINICAL DATA:  Unintentional weight loss. Nonlocalized abdominal pain. Biliary dilatation on abdominal CT. EXAM: MRI ABDOMEN WITHOUT AND WITH CONTRAST (INCLUDING MRCP) TECHNIQUE: Multiplanar multisequence MR imaging of the abdomen was performed both before and after the administration of intravenous contrast. Heavily T2-weighted images of the biliary and pancreatic ducts were obtained, and three-dimensional MRCP images were rendered by post processing. CONTRAST:  83mL GADAVIST GADOBUTROL 1 MMOL/ML IV SOLN COMPARISON:  Abdominopelvic CT  10/19/2019. Lumbar spine CT 10/15/2018. FINDINGS: Despite efforts by the technologist and patient, mild motion artifact is present on today's exam and could not be eliminated. This reduces exam sensitivity and specificity. Several sequences were repeated. Lower chest: There are dependent bilateral pleural effusions with associated atelectasis at both lung bases. Hepatobiliary: The liver is normal in signal without focal abnormality or abnormal enhancement. Patient is status post cholecystectomy. As seen on recent CT, there is moderate intrahepatic and extrahepatic biliary dilatation. The common hepatic duct measures up to 13 mm in diameter. Although detail is mildly limited by the motion, there is no evidence of choledocholithiasis. The biliary dilatation appears similar to the prior lumbar spine CT and is likely physiologic in this patient without elevated liver function studies on testing done yesterday. Pancreas: The pancreatic duct is mildly dilated to 4 mm. No evidence of pancreatic mass, pancreas divisum or surrounding inflammation. Spleen: Normal in size without focal abnormality. Adrenals/Urinary Tract: Both adrenal glands appear normal. There are small bilateral renal cysts. The collecting system dilatation seen on yesterday's CT is improved, likely due to improved bladder distention. The bladder remains mildly distended on the coronal images. Stomach/Bowel: No evidence of bowel wall thickening, distention or surrounding inflammatory change. Vascular/Lymphatic: There are no enlarged abdominal lymph nodes. Diffuse aortic and branch vessel atherosclerosis. Other: No ascites or focal extraluminal fluid collection. Musculoskeletal: Grossly stable compression deformities throughout the lumbar spine status post multilevel spinal augmentation. There is T2 hyperintensity within the sacral ala  bilaterally status post sacroplasty. There is a left-sided disc protrusion at T10-11 which causes mild cord flattening,  calcified on CT. IMPRESSION: 1. Moderate biliary dilatation status post cholecystectomy. No evidence of choledocholithiasis. Given similar appearance on previous lumbar CT and normal liver function studies, this is probably physiologic. 2. Mild pancreatic ductal dilatation without evidence of pancreatic mass or pancreas divisum. 3. Bilateral pleural effusions with associated atelectasis at both lung bases. 4. Calcified left-sided disc protrusion at T10-11. Electronically Signed   By: Richardean Sale M.D.   On: 10/20/2019 13:55      Discharge Exam: Vitals:   10/24/19 2004 10/25/19 0932  BP: (!) 160/74 (!) 150/77  Pulse: 80 86  Resp: 20   Temp: 98.4 F (36.9 C)   SpO2: 95% 98%   Vitals:   10/23/19 2040 10/24/19 0602 10/24/19 2004 10/25/19 0932  BP: (!) 163/78 (!) 163/77 (!) 160/74 (!) 150/77  Pulse: 86 77 80 86  Resp: 18 18 20    Temp: 98.5 F (36.9 C) 98.3 F (36.8 C) 98.4 F (36.9 C)   TempSrc: Oral Oral Oral   SpO2: 97% 94% 95% 98%  Weight:      Height:        General: Pt is alert, awake, not in acute distress Cardiovascular: RRR, S1/S2 +, no rubs, no gallops Respiratory: CTA bilaterally, no wheezing, no rhonchi Abdominal: Soft, NT, ND, bowel sounds + Extremities: no edema, no cyanosis    The results of significant diagnostics from this hospitalization (including imaging, microbiology, ancillary and laboratory) are listed below for reference.     Microbiology: Recent Results (from the past 240 hour(s))  SARS CORONAVIRUS 2 (TAT 6-24 HRS) Nasopharyngeal Nasopharyngeal Swab     Status: None   Collection Time: 10/20/19  1:43 AM   Specimen: Nasopharyngeal Swab  Result Value Ref Range Status   SARS Coronavirus 2 NEGATIVE NEGATIVE Final    Comment: (NOTE) SARS-CoV-2 target nucleic acids are NOT DETECTED. The SARS-CoV-2 RNA is generally detectable in upper and lower respiratory specimens during the acute phase of infection. Negative results do not preclude SARS-CoV-2  infection, do not rule out co-infections with other pathogens, and should not be used as the sole basis for treatment or other patient management decisions. Negative results must be combined with clinical observations, patient history, and epidemiological information. The expected result is Negative. Fact Sheet for Patients: SugarRoll.be Fact Sheet for Healthcare Providers: https://www.woods-mathews.com/ This test is not yet approved or cleared by the [place] FDA and  has been authorized for detection and/or diagnosis of SARS-CoV-2 by FDA under an Emergency Use Authorization (EUA). This EUA will remain  in effect (meaning this test can be used) for the duration of the COVID-19 declaration under Section 56 4(b)(1) of the Act, 21 U.S.C. section 360bbb-3(b)(1), unless the authorization is terminated or revoked sooner. Performed at [facility], [place] [address], [place],  [place] [address]   CULTURE, BLOOD (ROUTINE X 2) w Reflex to ID Panel     Status: None   Collection Time: 10/20/19  9:40 AM   Specimen: BLOOD  Result Value Ref Range Status   Specimen Description BLOOD RIGHT ANTECUBITAL  Final   Special Requests   Final    BOTTLES DRAWN AEROBIC AND ANAEROBIC Blood Culture results may not be optimal due to an inadequate volume of blood received in culture bottles   Culture   Final    NO GROWTH 5 DAYS Performed at [facility], [address] [place], [place]  [address]    Report Status 10/25/2019 FINAL  Final  CULTURE, BLOOD (ROUTINE X 2) w Reflex to ID Panel     Status: None   Collection Time: 10/20/19  9:40 AM   Specimen: BLOOD  Result Value Ref Range Status   Specimen Description BLOOD BLOOD RIGHT FOREARM  Final   Special Requests   Final    BOTTLES DRAWN AEROBIC AND ANAEROBIC Blood Culture adequate volume   Culture   Final    NO GROWTH 5 DAYS Performed at [facility], [address] [place], [place]  [address]    Report Status 10/25/2019 FINAL  Final  MRSA PCR Screening     Status: None   Collection Time: 10/23/19  7:41 PM   Specimen: Nasal Mucosa; Nasopharyngeal  Result Value Ref Range Status   MRSA by PCR NEGATIVE NEGATIVE Final    Comment:        The GeneXpert MRSA Assay (FDA approved for NASAL specimens only), is one component of a comprehensive MRSA colonization surveillance program. It is not intended to diagnose MRSA infection nor to guide or monitor treatment for MRSA infections. Performed at [facility], [place], [place] [address]   SARS CORONAVIRUS 2 (TAT 6-24 HRS) Nasopharyngeal Nasopharyngeal Swab     Status: None   Collection Time: 10/23/19 11:56 PM   Specimen: Nasopharyngeal Swab  Result Value Ref Range Status   SARS Coronavirus 2 NEGATIVE NEGATIVE Final    Comment: (NOTE) SARS-CoV-2 target nucleic acids are NOT DETECTED. The SARS-CoV-2 RNA is generally detectable in upper and lower respiratory specimens during the acute phase of infection. Negative results do not preclude SARS-CoV-2 infection, do not rule out co-infections with other pathogens, and should not be used as the sole basis for treatment or other patient management decisions. Negative results must be combined with clinical observations, patient history, and epidemiological information. The expected result is Negative. Fact Sheet for Patients: SugarRoll.be Fact Sheet for Healthcare Providers: https://www.woods-mathews.com/ This test is not yet approved or cleared by the [place] FDA and  has been authorized for detection and/or diagnosis of SARS-CoV-2 by FDA under an Emergency Use Authorization (EUA). This EUA will remain  in effect (meaning this test can be used) for the duration of the COVID-19 declaration under Section 56 4(b)(1) of the Act, 21 U.S.C. section 360bbb-3(b)(1), unless the authorization is terminated  or revoked sooner. Performed at [facility], [place] [address], [place], [place] [address]      Labs: BNP (last 3 results) No results for input(s): BNP in the last 8760 hours. Basic Metabolic Panel: Recent Labs  Lab 10/19/19 1716 10/20/19 0548 10/21/19 0631 10/22/19 0447 10/23/19 0341 10/24/19 0416  NA  --  138 139 145 145 140  K  --  3.3* 4.0 3.2* 3.4* 3.4*  CL  --  104 109 115* 113* 110  CO2  --  22 18* 22 23 22   GLUCOSE  --  165* 119* 96 99 100*  BUN  --  9 12 19 16 12   CREATININE  --  0.52 0.38* 0.55 0.46 0.51  CALCIUM  --  8.6* 8.4* 8.2* 8.7* 8.1*  MG 1.8  --   --  2.1  --  2.0   Liver Function Tests: Recent Labs  Lab 10/19/19 1538  AST 23  ALT 13  ALKPHOS 64  BILITOT 0.7  PROT 7.1  ALBUMIN 3.6   Recent Labs  Lab 10/19/19 1538  LIPASE 24  No results for input(s): AMMONIA in the last 168 hours. CBC: Recent Labs  Lab 10/20/19 0548 10/21/19 0631 10/22/19 0447 10/23/19 0341 10/24/19 0416  WBC 17.7* 17.7* 9.7 7.1 7.5  HGB 11.5* 10.6* 8.7* 10.2* 10.5*  HCT 34.5* 33.0* 27.1* 32.5* 31.3*  MCV 92.7 94.6 96.1 96.4 90.5  PLT 506* 461* 278 324 311   Cardiac Enzymes: No results for input(s): CKTOTAL, CKMB, CKMBINDEX, TROPONINI in the last 168 hours. BNP: Invalid input(s): POCBNP CBG: No results for input(s): GLUCAP in the last 168 hours. D-Dimer No results for input(s): DDIMER in the last 72 hours. Hgb A1c No results for input(s): HGBA1C in the last 72 hours. Lipid Profile No results for input(s): CHOL, HDL, LDLCALC, TRIG, CHOLHDL, LDLDIRECT in the last 72 hours. Thyroid function studies No results for input(s): TSH, T4TOTAL, T3FREE, THYROIDAB in the last 72 hours.  Invalid input(s): FREET3 Anemia work up No results for input(s): VITAMINB12, FOLATE, FERRITIN, TIBC, IRON, RETICCTPCT in the last 72 hours. Urinalysis    Component Value Date/Time   COLORURINE STRAW (A) 10/19/2019 1522   APPEARANCEUR CLEAR (A) 10/19/2019 1522   APPEARANCEUR  Cloudy (A) 07/18/2019 1122   LABSPEC 1.018 10/19/2019 1522   LABSPEC 1.012 12/13/2014 1745   PHURINE 7.0 10/19/2019 1522   GLUCOSEU 150 (A) 10/19/2019 1522   GLUCOSEU Negative 12/13/2014 1745   HGBUR NEGATIVE 10/19/2019 [place] 10/19/2019 1522   BILIRUBINUR Negative 07/18/2019 1122   BILIRUBINUR Negative 12/13/2014 1745   KETONESUR 20 (A) 10/19/2019 1522   PROTEINUR NEGATIVE 10/19/2019 1522   NITRITE NEGATIVE 10/19/2019 1522   LEUKOCYTESUR NEGATIVE 10/19/2019 1522   LEUKOCYTESUR Negative 12/13/2014 1745   Sepsis Labs Invalid input(s): PROCALCITONIN,  WBC,  LACTICIDVEN Microbiology Recent Results (from the past 240 hour(s))  SARS CORONAVIRUS 2 (TAT 6-24 HRS) Nasopharyngeal Nasopharyngeal Swab     Status: None   Collection Time: 10/20/19  1:43 AM   Specimen: Nasopharyngeal Swab  Result Value Ref Range Status   SARS Coronavirus 2 NEGATIVE NEGATIVE Final    Comment: (NOTE) SARS-CoV-2 target nucleic acids are NOT DETECTED. The SARS-CoV-2 RNA is generally detectable in upper and lower respiratory specimens during the acute phase of infection. Negative results do not preclude SARS-CoV-2 infection, do not rule out co-infections with other pathogens, and should not be used as the sole basis for treatment or other patient management decisions. Negative results must be combined with clinical observations, patient history, and epidemiological information. The expected result is Negative. Fact Sheet for Patients: SugarRoll.be Fact Sheet for Healthcare Providers: https://www.woods-mathews.com/ This test is not yet approved or cleared by the [place] FDA and  has been authorized for detection and/or diagnosis of SARS-CoV-2 by FDA under an Emergency Use Authorization (EUA). This EUA will remain  in effect (meaning this test can be used) for the duration of the COVID-19 declaration under Section 56 4(b)(1) of the Act, 21  U.S.C. section 360bbb-3(b)(1), unless the authorization is terminated or revoked sooner. Performed at [facility], [place] [address], [place], [place] [address]   CULTURE, BLOOD (ROUTINE X 2) w Reflex to ID Panel     Status: None   Collection Time: 10/20/19  9:40 AM   Specimen: BLOOD  Result Value Ref Range Status   Specimen Description BLOOD RIGHT ANTECUBITAL  Final   Special Requests   Final    BOTTLES DRAWN AEROBIC AND ANAEROBIC Blood Culture results may not be optimal due to an inadequate volume of blood received in culture bottles  Culture   Final    NO GROWTH 5 DAYS Performed at [facility], [address] [place], [place] [address]    Report Status 10/25/2019 FINAL  Final  CULTURE, BLOOD (ROUTINE X 2) w Reflex to ID Panel     Status: None   Collection Time: 10/20/19  9:40 AM   Specimen: BLOOD  Result Value Ref Range Status   Specimen Description BLOOD BLOOD RIGHT FOREARM  Final   Special Requests   Final    BOTTLES DRAWN AEROBIC AND ANAEROBIC Blood Culture adequate volume   Culture   Final    NO GROWTH 5 DAYS Performed at [facility], [address] [place], [place] [address]    Report Status 10/25/2019 FINAL  Final  MRSA PCR Screening     Status: None   Collection Time: 10/23/19  7:41 PM   Specimen: Nasal Mucosa; Nasopharyngeal  Result Value Ref Range Status   MRSA by PCR NEGATIVE NEGATIVE Final    Comment:        The GeneXpert MRSA Assay (FDA approved for NASAL specimens only), is one component of a comprehensive MRSA colonization surveillance program. It is not intended to diagnose MRSA infection nor to guide or monitor treatment for MRSA infections. Performed at [facility], [place], [place] [address]   SARS CORONAVIRUS 2 (TAT 6-24 HRS) Nasopharyngeal Nasopharyngeal Swab     Status: None   Collection Time: 10/23/19 11:56 PM   Specimen: Nasopharyngeal Swab  Result Value Ref Range Status   SARS  Coronavirus 2 NEGATIVE NEGATIVE Final    Comment: (NOTE) SARS-CoV-2 target nucleic acids are NOT DETECTED. The SARS-CoV-2 RNA is generally detectable in upper and lower respiratory specimens during the acute phase of infection. Negative results do not preclude SARS-CoV-2 infection, do not rule out co-infections with other pathogens, and should not be used as the sole basis for treatment or other patient management decisions. Negative results must be combined with clinical observations, patient history, and epidemiological information. The expected result is Negative. Fact Sheet for Patients: SugarRoll.be Fact Sheet for Healthcare Providers: https://www.woods-mathews.com/ This test is not yet approved or cleared by the [place] FDA and  has been authorized for detection and/or diagnosis of SARS-CoV-2 by FDA under an Emergency Use Authorization (EUA). This EUA will remain  in effect (meaning this test can be used) for the duration of the COVID-19 declaration under Section 56 4(b)(1) of the Act, 21 U.S.C. section 360bbb-3(b)(1), unless the authorization is terminated or revoked sooner. Performed at [facility], [place] [address], [place], [place] [address]     Time coordinating discharge:  35 minutes.   SIGNED:  Ivor Costa, DO [facility] 10/25/2019, 10:05 AM Pager is on [place]  If 7PM-7AM, please contact night-coverage [email or web address] Password [email or web address]

## 2019-10-24 NOTE — Progress Notes (Signed)
PROGRESS NOTE    Gabrielle Crosby  [id number] DOB: 25-Jun-1933 DOA: 10/19/2019 PCP: Toni Arthurs, NP    Brief Narrative:   Gabrielle Crosby  is a 85 y.o. Caucasian female with a known history of multiple medical problems that will be mentioned below, presented to the emergency room with acute onset of bilious vomitus as well as constipation with stool impaction which have been going on over the last couple days.  She denied any fever or chills.  She has been having abdominal discomfort with constipation.  No chest pain or dyspnea or palpitations.  No melena or bright red blood per rectum or hematemesis or jaundice.  No dysuria, oliguria or hematuria or flank pain.  Upon presentation to the emergency room, blood pressure was 155/71 and later 160/58 with otherwise normal vital signs.  Labs revealed hypokalemia of 3 and magnesium level came back 1.8 with lipase levels 24 and mild anemia.  High-sensitivity troponin I was 15.  Urinalysis showed 130 glucose and 20 ketones.  Abdominal pelvic CT scan showed her cholecystectomy and intrahepatic and extrahepatic biliary dilatation with common duct measuring up to 15 mm with recommendation for MRCP, slight bilateral hydronephrosis that may be due to the urinary bladder distention and sigmoid diverticulosis with large volume of stool in the colon and rectum and small right greater than left pleural effusions with partial consolidation and adjacent groundglass density in the lower lobes reflecting atelectasis or pneumonia with no acute abdominal or pelvic abnormalities.  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  EKG showed normal sinus rhythm with a rate of 81 with left atrial enlargement.  The patient was given 1 L bolus of IV normal saline as well as 4 mg of IV Zofran twice and 10 leucovorin IV potassium chloride.  She was  disimpacted by the ER physician.  She will be admitted to medical monitored bed for further evaluation and management.   Interim History: -11/20: MRCP  1. Moderate biliary dilatation status post cholecystectomy. No evidence of choledocholithiasis. Given similar appearance on  previous lumbar CT and normal liver function studies, this is probably physiologic.  2. Mild pancreatic ductal dilatation without evidence of pancreatic mass or pancreas divisum.  3. Bilateral pleural effusions with associated atelectasis at both lung bases.  4. Calcified left-sided disc protrusion at T10-11.  -GI consulted, due to unremarkable MRCP findings, recommended laxative treatment  -11/21: WBC no improvement, 17.7 -->17.7. will change Abx from Unasyn and azithromycin to vancomycin and unasyn - 11/23: WBC improved 17.7 -->9.7 -->7.1  - 11/23: consulted ortho for recent right wrist injury - 11/23: d/c'ed vanco and continue unasyn - 11/24: pending SNF bed   Subjective: Pt has dementia, only gives limited information.  Patient is comfortable. No active nausea, vomiting, diarrhea noted.  Does not seem to have chest pain or AP.  Assessment & Plan:   Principal Problem:   Intractable nausea and vomiting Active Problems:   Malignant neoplasm of urinary bladder (HCC)   Mixed Alzheimer's and vascular dementia ([place])   Essential (primary) hypertension   HLD (hyperlipidemia)   GERD (gastroesophageal reflux disease)   Aspiration pneumonia (HCC)   Bilateral hydronephrosis   Elevated troponin  Intractable nausea and vomiting and common bile duct dilatation with intra and extrahepatic biliary dilatation:  pt has constipation and fecal impaction. CT scan showed cholecystectomy and intrahepatic and extrahepatic biliary dilatation with  common duct measuring up to 15 mm with recommendation for MRCP which is done 11/20. It showed dilated common bile duct that was also seen on her CT scan.  The radiologist had  reported that the dilated not changed from the prior imaging.   GI, Dr. Allen Norris was consulted and highly appreciated his consultation. He recommended "The patient should be treated with laxatives to continue to help her clear out her bowels in case this is one of the reasons she is having her nausea and vomiting.  If the patient continues to have nausea and vomiting she may need an upper endoscopy to evaluate her esophagus and stomach to make sure she does not have an obstructive lesion". Dr. Marius Ditch saw pt 11/21 and 11/22, which is highly appreciated. She recommended to continue bowel regimen.  -Continue Dulcolax suppository as needed as well as oral MiraLAX daily - dys 1 diet. - prn zofran  Suspected aspiration pneumonia with bilateral pleural effusion: Leukocytosis improved. SLP consulted, recommended "Dysphagive level 1 (puree) w/ Nectar consistency liquids; aspiration precautions; feeding support at all meals. Reduce distractions. Medication Administration: Crushed with puree".  WBC 7.1 and no fever. Infection has improved. - f/u Bx -->no growth so far - IV Unasyn and Zithromax --> changed to vancomycin and unasyn 11/21 -->d/ced vancomycine -->d/c'ed unasyn (all antibiotics) 11/24   CBC Latest Ref Rng & Units 10/24/2019 10/23/2019 10/22/2019  WBC 4.0 - 10.5 K/uL 7.5 7.1 9.7  Hemoglobin 12.0 - 15.0 g/dL 10.5(L) 10.2(L) 8.7(L)  Hematocrit 36.0 - 46.0 % 31.3(L) 32.5(L) 27.1(L)  Platelets 150 - 400 K/uL 311 324 278   Hypokalemia: k 3.4. -repleted  Depression.   -Celexa.  Uncontrolled hypertension.   -We will continue Zestril and place the patient on as needed IV labetalol.  Dementia with behavioral changes.   -Continue Namenda and Seroquel.  Dyslipidemia.   -Statin  Elevated trop: trop 15 -->24 --> 70 --> 80. No CP, possible demand ischemia. -ASA and statin  Bilateral hydronephrosis and hx of malignant neoplasm of urinary bladder: CT scan showed slight bilateral hydronephrosis  that may be due to the urinary bladder distention and sigmoid diverticulosis with large volume of stool in the colon and rectum. Cre normal 0.52 and BUN 9 -Foley in  Recent right wrist injury: orhto, Dr. Kyla Balzarine was consulted. Per Dr. Kyla Balzarine, "Prior wrist contusion is now resolved.  Recommend no immobilization and no limitation with the right wrist". -monitoring    DVT prophylaxis: sq Heparin Code Status: DNR per her daughter 11/21 Family Communication: daughter at bedside Disposition Plan: pt ready to be discharged. Pending SNF bed Consultants:  -GI -ortho  Procedures:  Pending MRCP  Antimicrobials: Anti-infectives (From admission, onward)   Start     Dose/Rate Route Frequency Ordered Stop   10/21/19 1800  Ampicillin-Sulbactam (UNASYN) 3 g in sodium chloride 0.9 % 100 mL IVPB  Status:  Discontinued     3 g 200 mL/hr over 30 Minutes Intravenous Every 6 hours 10/21/19 1224 10/24/19 2125   10/21/19 1300  vancomycin (VANCOCIN) 1,250 mg in sodium chloride 0.9 % 250 mL IVPB  Status:  Discontinued     1,250 mg 166.7 mL/hr over 90 Minutes Intravenous Every 48 hours 10/21/19 1223 10/21/19 1227   10/21/19 1300  Vancomycin (VANCOCIN) 1,250 mg in sodium chloride 0.9 % 250 mL IVPB  Status:  Discontinued     1,250 mg 166.7 mL/hr over 90 Minutes Intravenous Every 48 hours 10/21/19 1227 10/23/19 1007   10/20/19 0645  Ampicillin-Sulbactam (UNASYN) 3  g in sodium chloride 0.9 % 100 mL IVPB  Status:  Discontinued     3 g 200 mL/hr over 30 Minutes Intravenous Every 6 hours 10/20/19 0639 10/21/19 1153   10/20/19 0645  azithromycin (ZITHROMAX) 500 mg in sodium chloride 0.9 % 250 mL IVPB  Status:  Discontinued     500 mg 250 mL/hr over 60 Minutes Intravenous Every 24 hours 10/20/19 0639 10/21/19 1153          Objective: Vitals:   10/23/19 1128 10/23/19 2040 10/24/19 0602 10/24/19 2004  BP: (!) 163/63 (!) 163/78 (!) 163/77 (!) 160/74  Pulse: 75 86 77 80  Resp:  18 18 20   Temp: 97.7 F (36.5  C) 98.5 F (36.9 C) 98.3 F (36.8 C) 98.4 F (36.9 C)  TempSrc:  Oral Oral Oral  SpO2: 98% 97% 94% 95%  Weight:      Height:        Intake/Output Summary (Last 24 hours) at 10/24/2019 2126 Last data filed at 10/24/2019 2007 Gross per 24 hour  Intake 1862.65 ml  Output 950 ml  Net 912.65 ml   Filed Weights   10/19/19 1520  Weight: 46 kg    Examination:  Physical Exam:  General: Not in acute distress HEENT: PERRL, EOMI, no scleral icterus, No JVD or bruit Cardiac: S1/S2, RRR, No murmurs, gallops or rubs Pulm: has rhonchi bilaterally.  Abd: Soft, nondistended, has tenderness in epigastric area, no rebound pain, no organomegaly, BS present Ext: No edema. 2+DP/PT pulse bilaterally Musculoskeletal: No joint deformities, erythema, or stiffness, ROM full Skin: No rashes.  Neuro: Alert, cranial nerves II-XII grossly intact, moves all extremeties. Psych: Patient is not psychotic, no suicidal or hemocidal ideation.    Data Reviewed: I have personally reviewed following labs and imaging studies  CBC: Recent Labs  Lab 10/20/19 0548 10/21/19 0631 10/22/19 0447 10/23/19 0341 10/24/19 0416  WBC 17.7* 17.7* 9.7 7.1 7.5  HGB 11.5* 10.6* 8.7* 10.2* 10.5*  HCT 34.5* 33.0* 27.1* 32.5* 31.3*  MCV 92.7 94.6 96.1 96.4 90.5  PLT 506* 461* 278 324 [id number]   Basic Metabolic Panel: Recent Labs  Lab 10/19/19 1716 10/20/19 0548 10/21/19 0631 10/22/19 0447 10/23/19 0341 10/24/19 0416  NA  --  138 139 145 145 140  K  --  3.3* 4.0 3.2* 3.4* 3.4*  CL  --  104 109 115* 113* 110  CO2  --  22 18* 22 23 22   GLUCOSE  --  165* 119* 96 99 100*  BUN  --  9 12 19 16 12   CREATININE  --  0.52 0.38* 0.55 0.46 0.51  CALCIUM  --  8.6* 8.4* 8.2* 8.7* 8.1*  MG 1.8  --   --  2.1  --  2.0   GFR: Estimated Creatinine Clearance: 38.3 mL/min (by C-G formula based on SCr of 0.51 mg/dL). Liver Function Tests: Recent Labs  Lab 10/19/19 1538  AST 23  ALT 13  ALKPHOS 64  BILITOT 0.7  PROT 7.1   ALBUMIN 3.6   Recent Labs  Lab 10/19/19 1538  LIPASE 24   No results for input(s): AMMONIA in the last 168 hours. Coagulation Profile: No results for input(s): INR, PROTIME in the last 168 hours. Cardiac Enzymes: No results for input(s): CKTOTAL, CKMB, CKMBINDEX, TROPONINI in the last 168 hours. BNP (last 3 results) No results for input(s): PROBNP in the last 8760 hours. HbA1C: No results for input(s): HGBA1C in the last 72 hours. CBG: No results for input(s): GLUCAP  in the last 168 hours. Lipid Profile: No results for input(s): CHOL, HDL, LDLCALC, TRIG, CHOLHDL, LDLDIRECT in the last 72 hours. Thyroid Function Tests: No results for input(s): TSH, T4TOTAL, FREET4, T3FREE, THYROIDAB in the last 72 hours. Anemia Panel: No results for input(s): VITAMINB12, FOLATE, FERRITIN, TIBC, IRON, RETICCTPCT in the last 72 hours. Sepsis Labs: No results for input(s): PROCALCITON, LATICACIDVEN in the last 168 hours.  Recent Results (from the past 240 hour(s))  SARS CORONAVIRUS 2 (TAT 6-24 HRS) Nasopharyngeal Nasopharyngeal Swab     Status: None   Collection Time: 10/20/19  1:43 AM   Specimen: Nasopharyngeal Swab  Result Value Ref Range Status   SARS Coronavirus 2 NEGATIVE NEGATIVE Final    Comment: (NOTE) SARS-CoV-2 target nucleic acids are NOT DETECTED. The SARS-CoV-2 RNA is generally detectable in upper and lower respiratory specimens during the acute phase of infection. Negative results do not preclude SARS-CoV-2 infection, do not rule out co-infections with other pathogens, and should not be used as the sole basis for treatment or other patient management decisions. Negative results must be combined with clinical observations, patient history, and epidemiological information. The expected result is Negative. Fact Sheet for Patients: SugarRoll.be Fact Sheet for Healthcare Providers: https://www.woods-mathews.com/ This test is not yet  approved or cleared by the [place] FDA and  has been authorized for detection and/or diagnosis of SARS-CoV-2 by FDA under an Emergency Use Authorization (EUA). This EUA will remain  in effect (meaning this test can be used) for the duration of the COVID-19 declaration under Section 56 4(b)(1) of the Act, 21 U.S.C. section 360bbb-3(b)(1), unless the authorization is terminated or revoked sooner. Performed at [facility], [place] [address], [place], [place] [address]   CULTURE, BLOOD (ROUTINE X 2) w Reflex to ID Panel     Status: None (Preliminary result)   Collection Time: 10/20/19  9:40 AM   Specimen: BLOOD  Result Value Ref Range Status   Specimen Description BLOOD RIGHT ANTECUBITAL  Final   Special Requests   Final    BOTTLES DRAWN AEROBIC AND ANAEROBIC Blood Culture results may not be optimal due to an inadequate volume of blood received in culture bottles   Culture   Final    NO GROWTH 3 DAYS Performed at [facility], [address] [place], [place] [address]    Report Status PENDING  Incomplete  CULTURE, BLOOD (ROUTINE X 2) w Reflex to ID Panel     Status: None (Preliminary result)   Collection Time: 10/20/19  9:40 AM   Specimen: BLOOD  Result Value Ref Range Status   Specimen Description BLOOD BLOOD RIGHT FOREARM  Final   Special Requests   Final    BOTTLES DRAWN AEROBIC AND ANAEROBIC Blood Culture adequate volume   Culture   Final    NO GROWTH 3 DAYS Performed at [facility], [address], [place], [place] [address]    Report Status PENDING  Incomplete  MRSA PCR Screening     Status: None   Collection Time: 10/23/19  7:41 PM   Specimen: Nasal Mucosa; Nasopharyngeal  Result Value Ref Range Status   MRSA by PCR NEGATIVE NEGATIVE Final    Comment:        The GeneXpert MRSA Assay (FDA approved for NASAL specimens only), is one component of a comprehensive MRSA colonization surveillance program. It is not intended to diagnose  MRSA infection nor to guide or monitor treatment for MRSA infections. Performed at [facility], [phone] [place], [place] [address]   SARS CORONAVIRUS 2 (TAT 6-24 HRS) Nasopharyngeal Nasopharyngeal Swab     Status: None   Collection Time: 10/23/19 11:56 PM   Specimen: Nasopharyngeal Swab  Result Value Ref Range Status   SARS Coronavirus 2 NEGATIVE NEGATIVE Final    Comment: (NOTE) SARS-CoV-2 target nucleic acids are NOT DETECTED. The SARS-CoV-2 RNA is generally detectable in upper and lower respiratory specimens during the acute phase of infection. Negative results do not preclude SARS-CoV-2 infection, do not rule out co-infections with other pathogens, and should not be used as the sole basis for treatment or other patient management decisions. Negative results must be combined with clinical observations, patient history, and epidemiological information. The expected result is Negative. Fact Sheet for Patients: SugarRoll.be Fact Sheet for Healthcare Providers: https://www.woods-mathews.com/ This test is not yet approved or cleared by the [place] FDA and  has been authorized for detection and/or diagnosis of SARS-CoV-2 by FDA under an Emergency Use Authorization (EUA). This EUA will remain  in effect (meaning this test can be used) for the duration of the COVID-19 declaration under Section 56 4(b)(1) of the Act, 21 U.S.C. section 360bbb-3(b)(1), unless the authorization is terminated or revoked sooner. Performed at [facility], [place] [address] [place], [place] [address]        Radiology Studies: No results found.      Scheduled Meds: . aspirin  81 mg Oral Daily  . bisacodyl  10 mg Rectal Daily  . Chlorhexidine Gluconate Cloth  6 each Topical Daily  . cholecalciferol  2,000 Units Oral Daily  . citalopram  20 mg Oral Daily  . feeding supplement (ENSURE ENLIVE)  237 mL Oral BID BM  . guaiFENesin   10 mL Oral Q4H while awake  . heparin injection (subcutaneous)  5,000 Units Subcutaneous Q8H  . lisinopril  5 mg Oral Daily  . magnesium oxide  200 mg Oral Daily  . memantine  10 mg Oral BID  . polyethylene glycol  17 g Oral Daily  . pravastatin  20 mg Oral Daily  . QUEtiapine  25 mg Oral q morning - 10a   And  . QUEtiapine  50 mg Oral QHS  . sodium chloride flush  3 mL Intravenous Once  . traZODone  50 mg Oral QHS  . vitamin B-12  1,000 mcg Oral Daily   Continuous Infusions: . sodium chloride 1,000 mL (10/24/19 2053)  . lactated ringers Stopped (10/24/19 1930)     LOS: 4 days    Time spent: 37 min     Ivor Costa, DO [facility] PAGER is on [place]  If 7PM-7AM, please contact night-coverage [email or web address] Password [facility] 10/24/2019, 9:26 PM

## 2019-10-24 NOTE — TOC Progression Note (Signed)
Transition of Care [facility]) - Progression Note    Patient Details  Name: Gabrielle Crosby MRN: [id number] Date of Birth: 06-30-1933  Transition of Care [facility]) CM/SW [place], LCSW Phone Number: 10/24/2019, 4:39 PM  Clinical Narrative: [facility] requested information on PLOF. CSW spoke with Stage from the ALF. Patient is from the memory care unit. She usually knows what is going on, what day of the week it is. Typically she does not know the date. Patient is usually independent at the facility requiring only minimal assistance as needed. She walks independently without equipment. CSW gave this information to [facility] over the phone and faxed over OT eval, speech eval, and speech's updated note.    Expected Discharge Plan: Skilled Nursing Facility Barriers to Discharge: Ship broker, Continued Medical Work up  Expected Discharge Plan and Services Expected Discharge Plan: [place] In-house Referral: Clinical Social Work Discharge Planning Services: NA Post Acute Care Choice: [place] Living arrangements for the past 2 months: Assisted Living Facility([facility])                           HH Arranged: NA           Social Determinants of Health (SDOH) Interventions    Readmission Risk Interventions No flowsheet data found.

## 2019-10-24 NOTE — Progress Notes (Signed)
MD notified Do you know if this patient was suppost to get a x-ray of her right hand where she had a fracture. Her daughter is in the room and was under the impression that she was going to an x-ray but I did not find any orders or results.

## 2019-10-24 NOTE — Discharge Instructions (Signed)
You were cared for by a hospitalist during your hospital stay. If you have any questions about your discharge medications or the care you received while you were in the hospital after you are discharged, you can call the unit and ask to speak with the hospitalist on call if the hospitalist that took care of you is not available. Once you are discharged, your primary care physician will handle any further medical issues. Please note that NO REFILLS for any discharge medications will be authorized once you are discharged, as it is imperative that you return to your primary care physician (or establish a relationship with a primary care physician if you do not have one) for your aftercare needs so that they can reassess your need for medications and monitor your lab values.  Follow up with PCP and gastroenterologist, take all medications as prescribed. If symptoms change or worsen please return to the ED for evaluation   Following is GI doctor's note:  I think her vomiting was secondary to a large fecal impaction which we removed.  She tried taking 1 cap of MiraLAX daily.  She can take some Zofran to help with nausea but if her vomiting is getting worse or abdominal pain is getting worse she should return to the ER.  1. No CT evidence for acute intra-abdominal or pelvic abnormality. 2. Small right greater than left pleural effusions with partial consolidations and adjacent ground-glass density in the lower lobes either reflecting atelectasis or pneumonia 3. Status post cholecystectomy. Intra and extrahepatic biliary dilatation with common duct measuring up to 15 mm. Recommend correlation with LFTs with follow-up MRCP if clinically indicated 4. Slight hydronephrosis bilaterally, which may be secondary to urinary bladder distention 5. Sigmoid colon diverticular disease without acute inflammatory process. Large volume of stool in the colon and rectum

## 2019-10-25 ENCOUNTER — Telehealth: Payer: Self-pay | Admitting: Gastroenterology

## 2019-10-25 DIAGNOSIS — K5641 Fecal impaction: Secondary | ICD-10-CM

## 2019-10-25 DIAGNOSIS — C679 Malignant neoplasm of bladder, unspecified: Secondary | ICD-10-CM

## 2019-10-25 LAB — CULTURE, BLOOD (ROUTINE X 2)
Culture: NO GROWTH
Culture: NO GROWTH
Special Requests: ADEQUATE

## 2019-10-25 NOTE — TOC Progression Note (Signed)
Transition of Care [facility]) - Progression Note    Patient Details  Name: Gabrielle Crosby MRN: [id number] Date of Birth: 05/24/1933  Transition of Care [facility]) CM/SW Contact  Ross Ludwig,  Phone Number: 10/25/2019, 9:52 AM  Clinical Narrative:    CSW received phone call that patient has been approved by her insurance company to go to [facility].  Authorization number is [id number], which is good for 7 days, Jones Skene is the case coordinator, updated notes to be sent to [phone].  CSW notified physician and he will work on the discharge summary.   Expected Discharge Plan: Skilled Nursing Facility Barriers to Discharge: Ship broker, Continued Medical Work up  Expected Discharge Plan and Services Expected Discharge Plan: [place] In-house Referral: Clinical Social Work Discharge Planning Services: NA Post Acute Care Choice: [place] Living arrangements for the past 2 months: Assisted Living Facility([facility])                           HH Arranged: NA           Social Determinants of Health (SDOH) Interventions    Readmission Risk Interventions No flowsheet data found.

## 2019-10-25 NOTE — Telephone Encounter (Signed)
Gabrielle Crosby with [facility] called & l/m for [place] to call patient & schedule her an appointment with Dr Marius Ditch. I called the number given [phone]) & spoke with Gabrielle Crosby (daughter) she is unaware of this & will talk with them @ the hospital then call back. Patient id being discharged to a health care facility.

## 2019-10-25 NOTE — Progress Notes (Signed)
Pt was discharged, and left via EMS. IV was removed, and belongings were sent with pt.

## 2019-10-25 NOTE — TOC Transition Note (Signed)
Transition of Care [facility]) - CM/SW Discharge Note   Patient Details  Name: Gabrielle Crosby MRN: [id number] Date of Birth: 1933/06/10  Transition of Care [facility]) CM/SW Contact:  Ross Ludwig, LCSW Phone Number: 10/25/2019, 1:02 PM   Clinical Narrative:     Patient to be d/c'ed today to [facility] room 8B.  Patient and family agreeable to plans will transport via ems RN to call report to [phone].  CSW contacted patient's daughter [name] [phone], and she is aware of patient's plan for discharge to SNF today.  Final next level of care: Skilled Nursing Facility Barriers to Discharge: Barriers Resolved   Patient Goals and CMS Choice Patient states their goals for this hospitalization and ongoing recovery are:: To go to SNF then return back to the ALF. CMS Medicare.gov Compare Post Acute Care list provided to:: Patient Choice offered to / list presented to : Adult Children  Discharge Placement   Existing PASRR number confirmed : 10/23/19          Patient chooses bed at: [facility] Patient to be transferred to facility by: [facility] Name of family member notified: [name] [phone] Patient and family notified of of transfer: 10/25/19  Discharge Plan and Services In-house Referral: Clinical Social Work Discharge Planning Services: NA Post Acute Care Choice: [place]          DME Arranged: N/A DME Agency: NA       HH Arranged: NA          Social Determinants of Health (SDOH) Interventions     Readmission Risk Interventions No flowsheet data found.

## 2019-10-25 NOTE — Care Management Important Message (Signed)
Important Message  Patient Details  Name: Gabrielle Crosby MRN: [id number] Date of Birth: Sep 02, 1933   Medicare Important Message Given:  Yes     [name] 10/25/2019, 11:11 AM

## 2019-11-10 ENCOUNTER — Telehealth: Payer: Self-pay | Admitting: Adult Health Nurse Practitioner

## 2019-11-10 ENCOUNTER — Other Ambulatory Visit: Payer: Self-pay

## 2019-11-10 ENCOUNTER — Non-Acute Institutional Stay: Payer: Medicare Other | Admitting: Adult Health Nurse Practitioner

## 2019-11-10 DIAGNOSIS — F0150 Vascular dementia without behavioral disturbance: Secondary | ICD-10-CM

## 2019-11-10 DIAGNOSIS — G309 Alzheimer's disease, unspecified: Secondary | ICD-10-CM

## 2019-11-10 DIAGNOSIS — Z515 Encounter for palliative care: Secondary | ICD-10-CM

## 2019-11-10 NOTE — Telephone Encounter (Signed)
Called facility to arrange a visit with patient.  Left VM with reason for call and contact info for call back Decorian Schuenemann K. Olena Heckle NP

## 2019-11-11 NOTE — Progress Notes (Signed)
[place] Consult Note Telephone: [phone]  Fax: [phone]  PATIENT NAME: Gabrielle Crosby DOB: [birth year] MRN: [id number]  PRIMARY CARE PROVIDER:   Toni Arthurs, NP  REFERRING PROVIDER:  Toni Arthurs, NP [address] [place],  [place] [address]  RESPONSIBLE PARTY:   [name], daughter [phone]    RECOMMENDATIONS and PLAN:  1.  Advanced care planning.  Patient is a DNR.  Will call daughter with update on visit  2.  Dementia.  FAST 6c.  Patient is able to ambulate unassisted.  Requires assistance with ADLs.  She has occasional urinary incontinence.  Able to feed self.  Staff reports that her appetite is good with no weight loss.  Was in hospital 11/19-11/25/2020 for fecal impaction and also found to have aspiration pneumonia.  Recommended to have puree diet with nectar thick liquids.  She has weakness and is currently working with home health PT.  She wears splint to right wrist that was started about 3 months ago due to pain when applying pressure to wrist; no fracture found.  She sleeps well and no reported episodes of agitation/anxiety.  Continue to supportive care and PT as ordered.  Continue colace BID and Miralax daily for constipation  She has only been back at facility for a week but has been doing well.  Denies SOB, cough, fever, pain, N/V/D, constipation, dizziness.  Palliative care will continue to monitor for symptom management and decline and will make recommendations as needed.      I spent 30 minutes providing this consultation,  from 12:00 to 12:30. More than 50% of the time in this consultation was spent coordinating communication.   HISTORY OF PRESENT ILLNESS:  Gabrielle Crosby is a 85 y.o. year old female with multiple medical problems including Stroke, seizure disorder, Alzheimer's withvascular dementia, bladder cancer, arthritis, hypertension, hyperlipidemia, GERD, depression. Palliative Care was asked  to help address goals of care.   CODE STATUS: DNR  PPS: 40% HOSPICE ELIGIBILITY/DIAGNOSIS: TBD  PHYSICAL EXAM:   General: NAD, frail appearing, thin Cardiovascular: regular rate and rhythm Pulmonary: lung sounds clear; normal respiratory effort Abdomen: soft, nontender, + bowel sounds GU: no suprapubic tenderness Extremities: no edema, no joint deformities Skin: no rashes Neurological: Weakness; A&O to person and place   PAST MEDICAL HISTORY:  Past Medical History:  Diagnosis Date  . Arthritis    hands  . Bladder cancer ([place])   . Brain bleed ([place]) 2019   d/t light strokes, high bp, seizures. brain has shrunk per dr. Manuella Ghazi and mri  . Cancer ([place])    BLADDER X 2  . Dementia ([place]) 2019   dementia progressing  . Depression   . GERD (gastroesophageal reflux disease)    chokes easily even on own saliva and then has panic attack  . Hyperlipidemia   . Hypertension 2019   no medications.  no htn per daughter  . Seizures [facility])    family not aware of any seizures and no treatment  . Stroke [facility]) 2019   undiagnosed but per neuro, she has had them causing vascular dementia  . Wears dentures    partial upper and lower    SOCIAL HX:  Social History   Tobacco Use  . Smoking status: Never Smoker  . Smokeless tobacco: Never Used  Substance Use Topics  . Alcohol use: Never    Alcohol/week: 0.0 standard drinks    ALLERGIES:  Allergies  Allergen Reactions  . Atenolol Other (See  Comments)    Bradycardia   . Donepezil Hcl Other (See Comments)    Mental changes, "made me crazy"     PERTINENT MEDICATIONS:  Outpatient Encounter Medications as of 11/10/2019  Medication Sig  . acetaminophen (TYLENOL) 500 MG tablet Take 500 mg by mouth 2 (two) times daily.   Marland Kitchen aspirin EC 81 MG tablet Take 81 mg by mouth daily.  . bisacodyl (DULCOLAX) 10 MG suppository Place 1 suppository (10 mg total) rectally daily.  . Cholecalciferol (VITAMIN D3) 50 MCG (2000 UT) capsule Take 2,000 Units by  mouth daily.   . citalopram (CELEXA) 20 MG tablet Take 20 mg by mouth daily.   Marland Kitchen docusate sodium (COLACE) 50 MG capsule Take 50 mg by mouth 2 (two) times daily as needed for mild constipation.  . feeding supplement, ENSURE ENLIVE, (ENSURE ENLIVE) LIQD Take 237 mLs by mouth 2 (two) times daily between meals.  Marland Kitchen lisinopril (ZESTRIL) 5 MG tablet Take 1 tablet (5 mg total) by mouth daily.  Marland Kitchen lovastatin (MEVACOR) 20 MG tablet Take 20 mg by mouth at bedtime.  . Magnesium 100 MG CAPS Take 100 mg by mouth daily.   . memantine (NAMENDA) 10 MG tablet Take 10 mg by mouth 2 (two) times daily.   . ondansetron (ZOFRAN) 4 MG tablet Take 1 tablet (4 mg total) by mouth daily as needed for nausea or vomiting.  . polyethylene glycol (MIRALAX / GLYCOLAX) 17 g packet Take 17 g by mouth daily.  . QUEtiapine (SEROQUEL) 25 MG tablet Take 50 mg by mouth 2 (two) times daily. 25 mg qam  50 mg qhs  . traZODone (DESYREL) 50 MG tablet Take 50 mg by mouth at bedtime.  . vitamin B-12 (CYANOCOBALAMIN) 1000 MCG tablet Take 1,000 mcg by mouth daily.   No facility-administered encounter medications on file as of 11/10/2019.     Audrinna Sherman Jenetta Downer, NP

## 2019-11-13 ENCOUNTER — Telehealth: Payer: Self-pay | Admitting: Adult Health Nurse Practitioner

## 2019-11-13 NOTE — Telephone Encounter (Signed)
Spoke with daughter to give update on visit last week.  She had no new concerns.  Gave her contact info and encouraged to call with questions or concerns. Floretta Petro K. Olena Heckle NP

## 2020-01-20 ENCOUNTER — Emergency Department
Admission: EM | Admit: 2020-01-20 | Discharge: 2020-01-20 | Disposition: A | Discharge status: Home / Self Care | Attending: Emergency Medicine | Admitting: Emergency Medicine

## 2020-01-20 ENCOUNTER — Encounter: Payer: Self-pay | Admitting: Intensive Care

## 2020-01-20 ENCOUNTER — Other Ambulatory Visit: Payer: Self-pay

## 2020-01-20 DIAGNOSIS — K5900 Constipation, unspecified: Secondary | ICD-10-CM

## 2020-01-20 DIAGNOSIS — Z8673 Personal history of transient ischemic attack (TIA), and cerebral infarction without residual deficits: Secondary | ICD-10-CM | POA: Diagnosis not present

## 2020-01-20 DIAGNOSIS — Z7982 Long term (current) use of aspirin: Secondary | ICD-10-CM | POA: Insufficient documentation

## 2020-01-20 DIAGNOSIS — F0390 Unspecified dementia without behavioral disturbance: Secondary | ICD-10-CM | POA: Diagnosis not present

## 2020-01-20 DIAGNOSIS — Z8551 Personal history of malignant neoplasm of bladder: Secondary | ICD-10-CM | POA: Insufficient documentation

## 2020-01-20 DIAGNOSIS — I10 Essential (primary) hypertension: Secondary | ICD-10-CM | POA: Insufficient documentation

## 2020-01-20 DIAGNOSIS — Z79899 Other long term (current) drug therapy: Secondary | ICD-10-CM | POA: Insufficient documentation

## 2020-01-20 NOTE — ED Triage Notes (Signed)
[facility]. Daughter brought patient in today with c/o impaction. Was given enema today with little results. Hospice nurse told daughter to bring patient to ER for impaction. HX alzheimer's. Disoriented X4 - daughter reports this is baseline

## 2020-01-20 NOTE — ED Provider Notes (Signed)
[facility] Emergency Department Provider Note   ____________________________________________   First MD Initiated Contact with Patient 01/20/20 1929     (approximate)  I have reviewed the triage vital signs and the nursing notes.   HISTORY  Chief Complaint Constipation   HPI Gabrielle Crosby is a 86 y.o. female with past medical history of advanced dementia, hypertension, and hyperlipidemia who presents to the ED for constipation.  History is limited as patient is minimally verbal and disoriented x4 at baseline.  Daughter reports that she has had increasing constipation and has not had a bowel movement in the past 2 to 3 days.  She regularly takes senna, magnesium citrate, Colace, and MiraLAX for constipation, but has been taking the Colace and MiraLAX as needed.  She was partially disimpacted today at her nursing facility and received an enema without significant passage of stool.  Daughter spoke with patient's hospice nurse, who recommended that she come to the ED for further evaluation and possible disimpaction.  Patient currently denies any pain and has not had any nausea or vomiting.        Past Medical History:  Diagnosis Date  . Arthritis    hands  . Bladder cancer ([place])   . Brain bleed ([place]) 2019   d/t light strokes, high bp, seizures. brain has shrunk per dr. Manuella Ghazi and mri  . Cancer ([place])    BLADDER X 2  . Dementia ([place]) 2019   dementia progressing  . Depression   . GERD (gastroesophageal reflux disease)    chokes easily even on own saliva and then has panic attack  . Hyperlipidemia   . Hypertension 2019   no medications.  no htn per daughter  . Seizures [facility])    family not aware of any seizures and no treatment  . Stroke [facility]) 2019   undiagnosed but per neuro, she has had them causing vascular dementia  . Wears dentures    partial upper and lower    Patient Active Problem List   Diagnosis Date Noted  . Fecal impaction ([place])   .  Abnormal findings of gastrointestinal tract   . Intractable nausea and vomiting 10/20/2019  . Aspiration pneumonia ([place]) 10/20/2019  . Bilateral hydronephrosis 10/20/2019  . Elevated troponin 10/20/2019  . HLD (hyperlipidemia) 07/25/2019  . GERD (gastroesophageal reflux disease) 07/25/2019  . Facial droop 07/25/2019  . Loss of memory 07/26/2016  . Anxiety 06/26/2016  . Bradycardia 01/02/2015  . H/O deep venous thrombosis 01/02/2015  . MI (mitral incompetence) 01/02/2015  . Near syncope 01/02/2015  . Malignant neoplasm of urinary bladder ([place]) 08/22/2014  . Mixed Alzheimer's and vascular dementia ([place]) 08/22/2014  . Essential (primary) hypertension 08/22/2014  . Adaptive colitis 08/22/2014  . Lacunar infarction ([place]) 08/22/2014  . Combined fat and carbohydrate induced hyperlipemia 08/22/2014  . Allergic rhinitis, seasonal 08/22/2014    Past Surgical History:  Procedure Laterality Date  . ABDOMINAL HYSTERECTOMY Bilateral    BILATERAL s&o  . BACK SURGERY    . BLADDER SURGERY N/A 2015   REMOVAL OF BLADDER CANCER X 2  . BOWEL RESECTION N/A 1999   SMALL BOWEL OBSTRUCTION  . CHOLECYSTECTOMY    . COLONOSCOPY N/A 11/06/2015   Procedure: COLONOSCOPY;  Surgeon: Hulen Luster, MD;  Location: [facility];  Service: Gastroenterology;  Laterality: N/A;  . CYSTOSCOPY WITH BIOPSY N/A 04/17/2015   Procedure: CYSTOSCOPY WITH BIOPSY;  Surgeon: Hollice Espy, MD;  Location: [facility];  Service: Urology;  Laterality: N/A;  .  KYPHOPLASTY N/A 12/20/2017   Procedure: [id number];  Surgeon: Hessie Knows, MD;  Location: [facility];  Service: Orthopedics;  Laterality: N/A;  . KYPHOPLASTY N/A 04/21/2018   Procedure: KYPHOPLASTY-L2,L3,L4,L5;  Surgeon: Hessie Knows, MD;  Location: [facility];  Service: Orthopedics;  Laterality: N/A;  . SACROPLASTY N/A 11/03/2018   Procedure: Dillard Cannon;  Surgeon: Hessie Knows, MD;  Location: [facility];  Service: Orthopedics;  Laterality: N/A;    Prior to Admission  medications   Medication Sig Start Date End Date Taking? Authorizing Provider  acetaminophen (TYLENOL) 500 MG tablet Take 500 mg by mouth 2 (two) times daily.     [provider]  aspirin EC 81 MG tablet Take 81 mg by mouth daily.    [provider]  bisacodyl (DULCOLAX) 10 MG suppository Place 1 suppository (10 mg total) rectally daily. 10/25/19   Ivor Costa, MD  Cholecalciferol (VITAMIN D3) 50 MCG (2000 UT) capsule Take 2,000 Units by mouth daily.  05/10/19   [provider]  citalopram (CELEXA) 20 MG tablet Take 20 mg by mouth daily.     [provider]  docusate sodium (COLACE) 50 MG capsule Take 50 mg by mouth 2 (two) times daily as needed for mild constipation.    [provider]  lisinopril (ZESTRIL) 5 MG tablet Take 1 tablet (5 mg total) by mouth daily. 07/29/19   Gouru, Illene Silver, MD  lovastatin (MEVACOR) 20 MG tablet Take 20 mg by mouth at bedtime.    [provider]  Magnesium 100 MG CAPS Take 100 mg by mouth daily.     [provider]  memantine (NAMENDA) 10 MG tablet Take 10 mg by mouth 2 (two) times daily.  11/05/16   [provider]  ondansetron (ZOFRAN) 4 MG tablet Take 1 tablet (4 mg total) by mouth daily as needed for nausea or vomiting. 10/24/19 10/23/20  Ivor Costa, MD  polyethylene glycol (MIRALAX / GLYCOLAX) 17 g packet Take 17 g by mouth daily. 10/25/19   Ivor Costa, MD  QUEtiapine (SEROQUEL) 25 MG tablet Take 50 mg by mouth 2 (two) times daily. 25 mg qam  50 mg qhs 05/10/19 05/09/20  [provider]  traZODone (DESYREL) 50 MG tablet Take 50 mg by mouth at bedtime.    [provider]  vitamin B-12 (CYANOCOBALAMIN) 1000 MCG tablet Take 1,000 mcg by mouth daily.    [provider]    Allergies Atenolol and Donepezil hcl  Family History  Family history unknown: Yes    Social History Social History   Tobacco Use  . Smoking status: Never Smoker  . Smokeless tobacco: Never Used   Substance Use Topics  . Alcohol use: Never    Alcohol/week: 0.0 standard drinks  . Drug use: No    Review of Systems Unable to obtain secondary to dementia  ____________________________________________   PHYSICAL EXAM:  VITAL SIGNS: ED Triage Vitals [01/20/20 1732]  Enc Vitals Group     BP (!) 123/50     Pulse Rate (!) 106     Resp 16     Temp 98.9 F (37.2 C)     Temp Source Oral     SpO2 100 %     Weight 106 lb (48.1 kg)     Height 5\' 1"  (1.549 m)     Head Circumference      Peak Flow      Pain Score      Pain Loc      Pain Edu?  Excl. in [place]?     Constitutional: Awake and alert, disoriented and minimally verbal, at reported baseline mental status. Eyes: Conjunctivae are normal. Head: Atraumatic. Nose: No congestion/rhinnorhea. Mouth/Throat: Mucous membranes are moist. Neck: Normal ROM Cardiovascular: Normal rate, regular rhythm. Grossly normal heart sounds. Respiratory: Normal respiratory effort.  No retractions. Lungs CTAB. Gastrointestinal: Soft and nontender. No distention.  Rectal exam shows no stool in rectal vault. Genitourinary: deferred Musculoskeletal: No lower extremity tenderness nor edema. Neurologic: No gross focal neurologic deficits are appreciated. Skin:  Skin is warm, dry and intact. No rash noted. Psychiatric: Mood and affect are normal. Speech and behavior are normal.  ____________________________________________   LABS (all labs ordered are listed, but only abnormal results are displayed)  Labs Reviewed - No data to display   PROCEDURES  Procedure(s) performed (including Critical Care):  Procedures   ____________________________________________   INITIAL IMPRESSION / ASSESSMENT AND PLAN / ED COURSE       86 year old female with history of advanced dementia and chronic constipation presents to the ED with increasing constipation over the past 2 to 3 days.  She appears overall well and is at her baseline mental status,  has not had any abdominal pain or vomiting with reassuring abdominal exam here.  Rectal exam performed with no apparent fecal impaction.  Given her lack of pain or vomiting, discussed with daughter checking labs and imaging, but she agrees with plan to hold off on doing this.  Daughter advised to have patient increase MiraLAX dose until patient has a bowel movement, also to start fiber supplement and take Colace consistently.  She is appropriate for discharge back to nursing facility and counseled daughter to have patient follow-up with her PCP, otherwise return to the ED for new or worsening symptoms.      ____________________________________________   FINAL CLINICAL IMPRESSION(S) / ED DIAGNOSES  Final diagnoses:  Constipation, unspecified constipation type  Dementia without behavioral disturbance, unspecified dementia type [facility])     ED Discharge Orders    None       Note:  This document was prepared using Dragon voice recognition software and may include unintentional dictation errors.   Blake Divine, MD 01/20/20 2014

## 2020-01-20 NOTE — ED Notes (Signed)
Patient tolerated rectal exam by Dr. Charna Archer well. No stool noted.

## 2020-01-20 NOTE — Discharge Instructions (Signed)
Your rectal exam today did not show any evidence of impacted stool.  In order to help with constipation, you should start taking a fiber supplement.  Please also take your Colace regularly twice daily instead of as needed.  You may increase your MiraLAX dose to 1 capful twice daily, followed by 2 capfuls the next day, 3 capfuls a day after that, to a maximum of 4 capfuls twice daily until you have a bowel movement.  Continue to use the Dulcolax as needed and take the senna and magnesium citrate consistently.  Please schedule follow-up with your primary care doctor and return to the ER for any pain or vomiting.

## 2020-02-28 ENCOUNTER — Telehealth: Payer: Self-pay | Admitting: Adult Health Nurse Practitioner

## 2020-02-28 NOTE — Telephone Encounter (Signed)
Spoke with daughter and confirmed that patient is now under hospice services with [facility].  Will update in our records Marthella Osorno K. Olena Heckle NP

## 2020-04-24 ENCOUNTER — Inpatient Hospital Stay
Admission: EM | Admit: 2020-04-24 | Discharge: 2020-05-02 | DRG: 480 | Disposition: A | Discharge status: Hospice | Payer: Medicare Other | Attending: Internal Medicine | Admitting: Internal Medicine

## 2020-04-24 ENCOUNTER — Emergency Department: Payer: Medicare Other

## 2020-04-24 ENCOUNTER — Other Ambulatory Visit: Payer: Self-pay

## 2020-04-24 DIAGNOSIS — Z66 Do not resuscitate: Secondary | ICD-10-CM | POA: Diagnosis present

## 2020-04-24 DIAGNOSIS — M810 Age-related osteoporosis without current pathological fracture: Secondary | ICD-10-CM | POA: Diagnosis present

## 2020-04-24 DIAGNOSIS — R040 Epistaxis: Secondary | ICD-10-CM | POA: Diagnosis not present

## 2020-04-24 DIAGNOSIS — Z90710 Acquired absence of both cervix and uterus: Secondary | ICD-10-CM

## 2020-04-24 DIAGNOSIS — D62 Acute posthemorrhagic anemia: Secondary | ICD-10-CM | POA: Diagnosis not present

## 2020-04-24 DIAGNOSIS — J9811 Atelectasis: Secondary | ICD-10-CM | POA: Diagnosis not present

## 2020-04-24 DIAGNOSIS — F0280 Dementia in other diseases classified elsewhere without behavioral disturbance: Secondary | ICD-10-CM

## 2020-04-24 DIAGNOSIS — W19XXXA Unspecified fall, initial encounter: Secondary | ICD-10-CM | POA: Diagnosis present

## 2020-04-24 DIAGNOSIS — Z79899 Other long term (current) drug therapy: Secondary | ICD-10-CM

## 2020-04-24 DIAGNOSIS — Z419 Encounter for procedure for purposes other than remedying health state, unspecified: Secondary | ICD-10-CM

## 2020-04-24 DIAGNOSIS — F03C0 Unspecified dementia, severe, without behavioral disturbance, psychotic disturbance, mood disturbance, and anxiety: Secondary | ICD-10-CM

## 2020-04-24 DIAGNOSIS — Z515 Encounter for palliative care: Secondary | ICD-10-CM | POA: Diagnosis not present

## 2020-04-24 DIAGNOSIS — Z789 Other specified health status: Secondary | ICD-10-CM | POA: Diagnosis not present

## 2020-04-24 DIAGNOSIS — E876 Hypokalemia: Secondary | ICD-10-CM | POA: Diagnosis present

## 2020-04-24 DIAGNOSIS — W19XXXD Unspecified fall, subsequent encounter: Secondary | ICD-10-CM | POA: Diagnosis not present

## 2020-04-24 DIAGNOSIS — U071 COVID-19: Secondary | ICD-10-CM | POA: Diagnosis present

## 2020-04-24 DIAGNOSIS — S72142A Displaced intertrochanteric fracture of left femur, initial encounter for closed fracture: Secondary | ICD-10-CM | POA: Diagnosis present

## 2020-04-24 DIAGNOSIS — M19041 Primary osteoarthritis, right hand: Secondary | ICD-10-CM | POA: Diagnosis present

## 2020-04-24 DIAGNOSIS — E785 Hyperlipidemia, unspecified: Secondary | ICD-10-CM | POA: Diagnosis present

## 2020-04-24 DIAGNOSIS — Z8673 Personal history of transient ischemic attack (TIA), and cerebral infarction without residual deficits: Secondary | ICD-10-CM

## 2020-04-24 DIAGNOSIS — S0083XA Contusion of other part of head, initial encounter: Secondary | ICD-10-CM

## 2020-04-24 DIAGNOSIS — Z7189 Other specified counseling: Secondary | ICD-10-CM | POA: Diagnosis not present

## 2020-04-24 DIAGNOSIS — Z888 Allergy status to other drugs, medicaments and biological substances status: Secondary | ICD-10-CM

## 2020-04-24 DIAGNOSIS — G309 Alzheimer's disease, unspecified: Secondary | ICD-10-CM | POA: Diagnosis present

## 2020-04-24 DIAGNOSIS — F329 Major depressive disorder, single episode, unspecified: Secondary | ICD-10-CM | POA: Diagnosis present

## 2020-04-24 DIAGNOSIS — K219 Gastro-esophageal reflux disease without esophagitis: Secondary | ICD-10-CM | POA: Diagnosis present

## 2020-04-24 DIAGNOSIS — Y92129 Unspecified place in nursing home as the place of occurrence of the external cause: Secondary | ICD-10-CM | POA: Diagnosis not present

## 2020-04-24 DIAGNOSIS — M19042 Primary osteoarthritis, left hand: Secondary | ICD-10-CM | POA: Diagnosis present

## 2020-04-24 DIAGNOSIS — Z8551 Personal history of malignant neoplasm of bladder: Secondary | ICD-10-CM | POA: Diagnosis not present

## 2020-04-24 DIAGNOSIS — I10 Essential (primary) hypertension: Secondary | ICD-10-CM | POA: Diagnosis present

## 2020-04-24 DIAGNOSIS — F0150 Vascular dementia without behavioral disturbance: Secondary | ICD-10-CM | POA: Diagnosis present

## 2020-04-24 DIAGNOSIS — R0902 Hypoxemia: Secondary | ICD-10-CM

## 2020-04-24 DIAGNOSIS — M25552 Pain in left hip: Secondary | ICD-10-CM | POA: Diagnosis present

## 2020-04-24 DIAGNOSIS — Z7982 Long term (current) use of aspirin: Secondary | ICD-10-CM

## 2020-04-24 DIAGNOSIS — Z8679 Personal history of other diseases of the circulatory system: Secondary | ICD-10-CM

## 2020-04-24 LAB — CBC WITH DIFFERENTIAL/PLATELET
Abs Immature Granulocytes: 0.04 10*3/uL (ref 0.00–0.07)
Basophils Absolute: 0 10*3/uL (ref 0.0–0.1)
Basophils Relative: 0 %
Eosinophils Absolute: 0 10*3/uL (ref 0.0–0.5)
Eosinophils Relative: 0 %
HCT: 35.4 % — ABNORMAL LOW (ref 36.0–46.0)
Hemoglobin: 11.1 g/dL — ABNORMAL LOW (ref 12.0–15.0)
Immature Granulocytes: 1 %
Lymphocytes Relative: 17 %
Lymphs Abs: 1.1 10*3/uL (ref 0.7–4.0)
MCH: 30.2 pg (ref 26.0–34.0)
MCHC: 31.4 g/dL (ref 30.0–36.0)
MCV: 96.2 fL (ref 80.0–100.0)
Monocytes Absolute: 0.7 10*3/uL (ref 0.1–1.0)
Monocytes Relative: 10 %
Neutro Abs: 4.9 10*3/uL (ref 1.7–7.7)
Neutrophils Relative %: 72 %
Platelets: 269 10*3/uL (ref 150–400)
RBC: 3.68 MIL/uL — ABNORMAL LOW (ref 3.87–5.11)
RDW: 16.4 % — ABNORMAL HIGH (ref 11.5–15.5)
WBC: 6.7 10*3/uL (ref 4.0–10.5)
nRBC: 0 % (ref 0.0–0.2)

## 2020-04-24 LAB — COMPREHENSIVE METABOLIC PANEL
ALT: 27 U/L (ref 0–44)
AST: 50 U/L — ABNORMAL HIGH (ref 15–41)
Albumin: 3.3 g/dL — ABNORMAL LOW (ref 3.5–5.0)
Alkaline Phosphatase: 120 U/L (ref 38–126)
Anion gap: 6 (ref 5–15)
BUN: 15 mg/dL (ref 8–23)
CO2: 29 mmol/L (ref 22–32)
Calcium: 8.5 mg/dL — ABNORMAL LOW (ref 8.9–10.3)
Chloride: 107 mmol/L (ref 98–111)
Creatinine, Ser: 0.46 mg/dL (ref 0.44–1.00)
GFR calc Af Amer: >60 mL/min (ref >60)
GFR calc non Af Amer: >60 mL/min (ref >60)
Glucose, Bld: 128 mg/dL — ABNORMAL HIGH (ref 70–99)
Potassium: 3.9 mmol/L (ref 3.5–5.1)
Sodium: 142 mmol/L (ref 135–145)
Total Bilirubin: 0.8 mg/dL (ref 0.3–1.2)
Total Protein: 7 g/dL (ref 6.5–8.1)

## 2020-04-24 LAB — URINALYSIS, COMPLETE (UACMP) WITH MICROSCOPIC
Bacteria, UA: NONE SEEN
Bilirubin Urine: NEGATIVE
Glucose, UA: NEGATIVE mg/dL
Hgb urine dipstick: NEGATIVE
Ketones, ur: NEGATIVE mg/dL
Leukocytes,Ua: NEGATIVE
Nitrite: NEGATIVE
Protein, ur: NEGATIVE mg/dL
Specific Gravity, Urine: 1.017 (ref 1.005–1.030)
pH: 7 (ref 5.0–8.0)

## 2020-04-24 LAB — TYPE AND SCREEN
ABO/RH(D): O NEG
Antibody Screen: NEGATIVE

## 2020-04-24 LAB — SARS CORONAVIRUS 2 BY RT PCR (HOSPITAL ORDER, PERFORMED IN ~~LOC~~ HOSPITAL LAB): SARS Coronavirus 2: POSITIVE — AB

## 2020-04-24 MED ORDER — VITAMIN D3 25 MCG (1000 UNIT) PO TABS
2000.0000 [IU] | ORAL_TABLET | Freq: Every day | ORAL | Status: DC
  Administered 2020-04-28: 10:00:00 2000 [IU] via ORAL
  Filled 2020-04-24 (×11): qty 2

## 2020-04-24 MED ORDER — METHOCARBAMOL 500 MG PO TABS
500.0000 mg | ORAL_TABLET | Freq: Four times a day (QID) | ORAL | Status: DC | PRN
  Filled 2020-04-24: qty 1

## 2020-04-24 MED ORDER — PRAVASTATIN SODIUM 20 MG PO TABS
20.0000 mg | ORAL_TABLET | Freq: Every day | ORAL | Status: DC
  Administered 2020-04-24 – 2020-05-01 (×4): 20 mg via ORAL
  Filled 2020-04-24 (×4): qty 1

## 2020-04-24 MED ORDER — MORPHINE SULFATE (PF) 2 MG/ML IV SOLN
0.5000 mg | INTRAVENOUS | Status: DC | PRN
  Administered 2020-04-25: 0.5 mg via INTRAVENOUS
  Filled 2020-04-24: qty 1

## 2020-04-24 MED ORDER — MEMANTINE HCL 5 MG PO TABS
10.0000 mg | ORAL_TABLET | Freq: Two times a day (BID) | ORAL | Status: DC
  Administered 2020-04-24 – 2020-05-01 (×4): 10 mg via ORAL
  Filled 2020-04-24 (×8): qty 2

## 2020-04-24 MED ORDER — QUETIAPINE FUMARATE 25 MG PO TABS: 50.0000 mg | ORAL_TABLET | Freq: Two times a day (BID) | ORAL | Status: DC

## 2020-04-24 MED ORDER — BISACODYL 10 MG RE SUPP
10.0000 mg | Freq: Every day | RECTAL | Status: DC
  Administered 2020-04-25: 11:00:00 10 mg via RECTAL
  Filled 2020-04-24 (×3): qty 1

## 2020-04-24 MED ORDER — ACETAMINOPHEN 500 MG PO TABS
500.0000 mg | ORAL_TABLET | Freq: Two times a day (BID) | ORAL | Status: DC
  Administered 2020-04-24 – 2020-05-01 (×5): 500 mg via ORAL
  Filled 2020-04-24 (×8): qty 1

## 2020-04-24 MED ORDER — DOCUSATE SODIUM 50 MG PO CAPS
50.0000 mg | ORAL_CAPSULE | Freq: Two times a day (BID) | ORAL | Status: DC | PRN
  Filled 2020-04-24: qty 1

## 2020-04-24 MED ORDER — METHOCARBAMOL 1000 MG/10ML IJ SOLN
500.0000 mg | Freq: Four times a day (QID) | INTRAVENOUS | Status: DC | PRN
  Filled 2020-04-24: qty 5

## 2020-04-24 MED ORDER — TRAZODONE HCL 50 MG PO TABS
50.0000 mg | ORAL_TABLET | Freq: Every day | ORAL | Status: DC
  Administered 2020-04-24 – 2020-05-01 (×3): 50 mg via ORAL
  Filled 2020-04-24 (×4): qty 1

## 2020-04-24 MED ORDER — LISINOPRIL 5 MG PO TABS
5.0000 mg | ORAL_TABLET | Freq: Every day | ORAL | Status: DC
  Administered 2020-04-24: 5 mg via ORAL
  Filled 2020-04-24: qty 1

## 2020-04-24 MED ORDER — QUETIAPINE FUMARATE 25 MG PO TABS
25.0000 mg | ORAL_TABLET | Freq: Every morning | ORAL | Status: DC
  Administered 2020-04-24 – 2020-04-28 (×2): 25 mg via ORAL
  Filled 2020-04-24 (×3): qty 1

## 2020-04-24 MED ORDER — HYDROCODONE-ACETAMINOPHEN 5-325 MG PO TABS
1.0000 | ORAL_TABLET | Freq: Four times a day (QID) | ORAL | Status: DC | PRN
  Administered 2020-04-24: 1 via ORAL
  Filled 2020-04-24: qty 1

## 2020-04-24 MED ORDER — CEFAZOLIN SODIUM-DEXTROSE 1-4 GM/50ML-% IV SOLN: 1.0000 g | INTRAVENOUS | Status: DC

## 2020-04-24 MED ORDER — POLYETHYLENE GLYCOL 3350 17 G PO PACK
17.0000 g | PACK | Freq: Every day | ORAL | Status: DC
  Administered 2020-04-24: 17 g via ORAL
  Filled 2020-04-24: qty 1

## 2020-04-24 MED ORDER — QUETIAPINE FUMARATE 25 MG PO TABS
50.0000 mg | ORAL_TABLET | Freq: Every day | ORAL | Status: DC
  Administered 2020-04-24 – 2020-05-01 (×3): 50 mg via ORAL
  Filled 2020-04-24 (×4): qty 2

## 2020-04-24 MED ORDER — MAGNESIUM 100 MG PO CAPS: 100.0000 mg | ORAL_CAPSULE | Freq: Every day | ORAL | Status: DC

## 2020-04-24 MED ORDER — CITALOPRAM HYDROBROMIDE 20 MG PO TABS
20.0000 mg | ORAL_TABLET | Freq: Every day | ORAL | Status: DC
  Administered 2020-04-28: 20 mg via ORAL
  Filled 2020-04-24 (×2): qty 1

## 2020-04-24 MED ORDER — VITAMIN B-12 1000 MCG PO TABS
1000.0000 ug | ORAL_TABLET | Freq: Every day | ORAL | Status: DC
  Administered 2020-04-24 – 2020-04-28 (×2): 1000 ug via ORAL
  Filled 2020-04-24 (×4): qty 1

## 2020-04-24 NOTE — ED Notes (Signed)
Patient was restless in bed. Sheet was bundled up and put between right leg which was bent and left leg. Patient is now sleeping. Will continue to monitor. Continued difficulty with obtaining pulse ox.

## 2020-04-24 NOTE — H&P (Addendum)
History and Physical    Gabrielle Crosby [id number] DOB: 10-04-1933 DOA: 04/24/2020  PCP: Toni Arthurs, NP   Patient coming from: SNF  I have personally briefly reviewed patient's old medical records in [place]  Chief Complaint: Status post fall  HPI: Gabrielle Crosby is a 86 y.o. female with medical history significant for advanced dementia, hypertension, seizures and stroke who was brought into the emergency room by EMS after an unwitnessed fall in the skilled nursing facility resulting in left hip pain and bruising of the forehead and left eye.  Patient is unable to provide any history but complains of left hip pain with palpitation.  Time of fall and reason for fall is unknown since patient is a poor historian. I am unable to do a review of systems on this patient due to her history of dementia. X-ray of the pelvis shows comminuted intertrochanteric femur fracture on the left with varus angulation at the fracture site and avulsion of a portion of the lesser trochanter on the left. Narrowing of each hip joint.  Bones diffusely osteoporotic. Patient's COVID 19 test is positive but per daughter she has completed her vaccination series   ED Course: Elderly female who resides in a skilled nursing facility brought in by EMS after she sustained an unwitnessed fall for evaluation of left hip pain.  Patient is noted to have an intertrochanteric femur fracture on the left and orthopedics has been consulted with the plan for surgery in a.m.  Patient had screening test for COVID-19 virus and is positive.  Review of Systems: As per HPI otherwise 10 point review of systems negative.    Past Medical History:  Diagnosis Date  . Arthritis    hands  . Bladder cancer ([place])   . Brain bleed ([place]) 2019   d/t light strokes, high bp, seizures. brain has shrunk per dr. Manuella Ghazi and mri  . Cancer ([place])    BLADDER X 2  . Dementia ([place]) 2019   dementia progressing  . Depression   . GERD  (gastroesophageal reflux disease)    chokes easily even on own saliva and then has panic attack  . Hyperlipidemia   . Hypertension 2019   no medications.  no htn per daughter  . Seizures [facility])    family not aware of any seizures and no treatment  . Stroke [facility]) 2019   undiagnosed but per neuro, she has had them causing vascular dementia  . Wears dentures    partial upper and lower    Past Surgical History:  Procedure Laterality Date  . ABDOMINAL HYSTERECTOMY Bilateral    BILATERAL s&o  . BACK SURGERY    . BLADDER SURGERY N/A 2015   REMOVAL OF BLADDER CANCER X 2  . BOWEL RESECTION N/A 1999   SMALL BOWEL OBSTRUCTION  . CHOLECYSTECTOMY    . COLONOSCOPY N/A 11/06/2015   Procedure: COLONOSCOPY;  Surgeon: Hulen Luster, MD;  Location: [facility];  Service: Gastroenterology;  Laterality: N/A;  . CYSTOSCOPY WITH BIOPSY N/A 04/17/2015   Procedure: CYSTOSCOPY WITH BIOPSY;  Surgeon: Hollice Espy, MD;  Location: [facility];  Service: Urology;  Laterality: N/A;  . KYPHOPLASTY N/A 12/20/2017   Procedure: [id number];  Surgeon: Hessie Knows, MD;  Location: [facility];  Service: Orthopedics;  Laterality: N/A;  . KYPHOPLASTY N/A 04/21/2018   Procedure: KYPHOPLASTY-L2,L3,L4,L5;  Surgeon: Hessie Knows, MD;  Location: [facility];  Service: Orthopedics;  Laterality: N/A;  . SACROPLASTY N/A 11/03/2018   Procedure: Dillard Cannon;  Surgeon:  Hessie Knows, MD;  Location: [facility];  Service: Orthopedics;  Laterality: N/A;     reports that she has never smoked. She has never used smokeless tobacco. She reports that she does not drink alcohol or use drugs.  Allergies  Allergen Reactions  . Atenolol Other (See Comments)    Bradycardia   . Donepezil Hcl Other (See Comments)    Mental changes, "made me crazy"    Family History  Family history unknown: Yes     Prior to Admission medications   Medication Sig Start Date End Date Taking? Authorizing Provider  acetaminophen (TYLENOL) 500 MG tablet  Take 500 mg by mouth 2 (two) times daily.     [provider]  aspirin EC 81 MG tablet Take 81 mg by mouth daily.    [provider]  bisacodyl (DULCOLAX) 10 MG suppository Place 1 suppository (10 mg total) rectally daily. 10/25/19   Ivor Costa, MD  Cholecalciferol (VITAMIN D3) 50 MCG (2000 UT) capsule Take 2,000 Units by mouth daily.  05/10/19   [provider]  citalopram (CELEXA) 20 MG tablet Take 20 mg by mouth daily.     [provider]  docusate sodium (COLACE) 50 MG capsule Take 50 mg by mouth 2 (two) times daily as needed for mild constipation.    [provider]  lisinopril (ZESTRIL) 5 MG tablet Take 1 tablet (5 mg total) by mouth daily. 07/29/19   Gouru, Illene Silver, MD  lovastatin (MEVACOR) 20 MG tablet Take 20 mg by mouth at bedtime.    [provider]  Magnesium 100 MG CAPS Take 100 mg by mouth daily.     [provider]  memantine (NAMENDA) 10 MG tablet Take 10 mg by mouth 2 (two) times daily.  11/05/16   [provider]  ondansetron (ZOFRAN) 4 MG tablet Take 1 tablet (4 mg total) by mouth daily as needed for nausea or vomiting. 10/24/19 10/23/20  Ivor Costa, MD  polyethylene glycol (MIRALAX / GLYCOLAX) 17 g packet Take 17 g by mouth daily. 10/25/19   Ivor Costa, MD  QUEtiapine (SEROQUEL) 25 MG tablet Take 50 mg by mouth 2 (two) times daily. 25 mg qam  50 mg qhs 05/10/19 05/09/20  [provider]  traZODone (DESYREL) 50 MG tablet Take 50 mg by mouth at bedtime.    [provider]  vitamin B-12 (CYANOCOBALAMIN) 1000 MCG tablet Take 1,000 mcg by mouth daily.    [provider]    Physical Exam: Vitals:   04/24/20 0816 04/24/20 0818  BP: 136/72   Pulse: 87   Resp: 18   Temp: 98.6 F (37 C)   TempSrc: Axillary   SpO2: 100%   Weight:  39.9 kg     Vitals:   04/24/20 0816 04/24/20 0818  BP: 136/72   Pulse: 87   Resp: 18   Temp: 98.6 F (37 C)   TempSrc: Axillary   SpO2: 100%    Weight:  39.9 kg    Constitutional: NAD, alert and awake, Frail Eyes: PERRL, lids and conjunctivae normal. Ecchymoses over both eyes ENMT: Mucous membranes are moist. Poor oral hygiene Neck: normal, supple, no masses, no thyromegaly Respiratory: clear to auscultation bilaterally, no wheezing, no crackles. Normal respiratory effort. No accessory muscle use.  Cardiovascular: Regular rate and rhythm,no murmurs / rubs / gallops. No extremity edema. 2+ pedal pulses. No carotid bruits.  Abdomen: no tenderness, no masses palpated. No hepatosplenomegaly. Bowel sounds positive.  Musculoskeletal: no clubbing / cyanosis. No  joint deformity upper and lower extremities.  Skin: no rashes, lesions, ulcers.  Neurologic: Unable to assess Psychiatric: Normal mood and affect.   Labs on Admission: I have personally reviewed following labs and imaging studies  CBC: Recent Labs  Lab 04/24/20 0822  WBC 6.7  NEUTROABS 4.9  HGB 11.1*  HCT 35.4*  MCV 96.2  PLT [id number]   Basic Metabolic Panel: Recent Labs  Lab 04/24/20 0822  NA 142  K 3.9  CL 107  CO2 29  GLUCOSE 128*  BUN 15  CREATININE 0.46  CALCIUM 8.5*   GFR: Estimated Creatinine Clearance: 33 mL/min (by C-G formula based on SCr of 0.46 mg/dL). Liver Function Tests: Recent Labs  Lab 04/24/20 0822  AST 50*  ALT 27  ALKPHOS 120  BILITOT 0.8  PROT 7.0  ALBUMIN 3.3*   No results for input(s): LIPASE, AMYLASE in the last 168 hours. No results for input(s): AMMONIA in the last 168 hours. Coagulation Profile: No results for input(s): INR, PROTIME in the last 168 hours. Cardiac Enzymes: No results for input(s): CKTOTAL, CKMB, CKMBINDEX, TROPONINI in the last 168 hours. BNP (last 3 results) No results for input(s): PROBNP in the last 8760 hours. HbA1C: No results for input(s): HGBA1C in the last 72 hours. CBG: No results for input(s): GLUCAP in the last 168 hours. Lipid Profile: No results for input(s): CHOL, HDL, LDLCALC, TRIG,  CHOLHDL, LDLDIRECT in the last 72 hours. Thyroid Function Tests: No results for input(s): TSH, T4TOTAL, FREET4, T3FREE, THYROIDAB in the last 72 hours. Anemia Panel: No results for input(s): VITAMINB12, FOLATE, FERRITIN, TIBC, IRON, RETICCTPCT in the last 72 hours. Urine analysis:    Component Value Date/Time   COLORURINE YELLOW (A) 04/24/2020 0844   APPEARANCEUR CLOUDY (A) 04/24/2020 0844   APPEARANCEUR Cloudy (A) 07/18/2019 1122   LABSPEC 1.017 04/24/2020 0844   LABSPEC 1.012 12/13/2014 1745   PHURINE 7.0 04/24/2020 0844   GLUCOSEU NEGATIVE 04/24/2020 0844   GLUCOSEU Negative 12/13/2014 1745   HGBUR NEGATIVE 04/24/2020 0844   BILIRUBINUR NEGATIVE 04/24/2020 0844   BILIRUBINUR Negative 07/18/2019 1122   BILIRUBINUR Negative 12/13/2014 [place] 04/24/2020 0844   PROTEINUR NEGATIVE 04/24/2020 0844   NITRITE NEGATIVE 04/24/2020 [place] 04/24/2020 0844   LEUKOCYTESUR Negative 12/13/2014 1745    Radiological Exams on Admission: DG Chest 1 View  Result Date: 04/24/2020 CLINICAL DATA:  Pain following fall EXAM: CHEST  1 VIEW COMPARISON:  October 21, 2019 FINDINGS: Lungs are clear. The heart size and pulmonary vascularity are normal. No adenopathy. Bones are osteoporotic. There is aortic atherosclerosis. No pneumothorax; skin fold noted on the right. Previous kyphoplasty procedures throughout the lumbar spine region noted. IMPRESSION: Lungs clear. Cardiac silhouette normal. Aortic Atherosclerosis (ICD10-I70.0). Electronically Signed   By: Lowella Grip III M.D.   On: 04/24/2020 09:36   CT HEAD WO CONTRAST  Result Date: 04/24/2020 CLINICAL DATA:  Ground level fall, unknown time bruising bilateral eyes and LEFT hip pain EXAM: CT HEAD WITHOUT CONTRAST CT CERVICAL SPINE WITHOUT CONTRAST TECHNIQUE: Multidetector CT imaging of the head and cervical spine was performed following the standard protocol without intravenous contrast. Multiplanar CT image  reconstructions of the cervical spine were also generated. COMPARISON:  CT head from August of 2020, MRI brain 07/26/2019 and CT head 10/19/2019 FINDINGS: CT HEAD FINDINGS Brain: Further evolution of a RIGHT frontal lobe infarct with volume loss and extensive white matter disease in the high high RIGHT posterior frontal lobe. Signs of prior LEFT  occipital infarct as well. Extensive atrophy as before. No acute acute intracranial abnormality. No signs of hemorrhage, mass effect, midline shift or increase in ventricular dilation. Interval development of small areas of infarct since the prior study along the high RIGHT central sulcus, these have a chronic appearance. Vascular: No hyperdense vessel or unexpected calcification. Skull: Normal. Negative for fracture or focal lesion. Sinuses/Orbits: No acute finding. Other: None. CT CERVICAL SPINE FINDINGS Alignment: Mild curvature of the spine and mild straightening of the spine may be due to position or spasm. Also could be related to degenerative changes. Skull base and vertebrae: No acute fracture. No primary bone lesion or focal pathologic process. Soft tissues and spinal canal: No prevertebral fluid or swelling. No visible canal hematoma. Disc levels: Multilevel degenerative changes greatest at C4-5 with 2 mm retrolisthesis of C4 on C5. Uncovertebral degenerative changes and osteophyte formation at C4-5, C5-6 and C6-7. Mild-to-moderate facet arthropathy greatest at C3-4 on the RIGHT. Upper chest: Choose 1 Other: None IMPRESSION: 1. No acute intracranial abnormality. 2. Further evolution of a RIGHT frontal lobe infarct with volume loss and extensive white matter disease in the high high RIGHT posterior frontal lobe. Signs of prior LEFT occipital infarct as well. 3. Interval development of small areas of infarct along the high RIGHT central sulcus, these have a chronic appearance. 4. No evidence of acute fracture or subluxation of the cervical spine. 5. Multilevel  degenerative changes of the cervical spine greatest at C4-5 with 2 mm retrolisthesis of C4 on C5. Electronically Signed   By: Zetta Bills M.D.   On: 04/24/2020 10:05   CT CERVICAL SPINE WO CONTRAST  Result Date: 04/24/2020 CLINICAL DATA:  Ground level fall, unknown time bruising bilateral eyes and LEFT hip pain EXAM: CT HEAD WITHOUT CONTRAST CT CERVICAL SPINE WITHOUT CONTRAST TECHNIQUE: Multidetector CT imaging of the head and cervical spine was performed following the standard protocol without intravenous contrast. Multiplanar CT image reconstructions of the cervical spine were also generated. COMPARISON:  CT head from August of 2020, MRI brain 07/26/2019 and CT head 10/19/2019 FINDINGS: CT HEAD FINDINGS Brain: Further evolution of a RIGHT frontal lobe infarct with volume loss and extensive white matter disease in the high high RIGHT posterior frontal lobe. Signs of prior LEFT occipital infarct as well. Extensive atrophy as before. No acute acute intracranial abnormality. No signs of hemorrhage, mass effect, midline shift or increase in ventricular dilation. Interval development of small areas of infarct since the prior study along the high RIGHT central sulcus, these have a chronic appearance. Vascular: No hyperdense vessel or unexpected calcification. Skull: Normal. Negative for fracture or focal lesion. Sinuses/Orbits: No acute finding. Other: None. CT CERVICAL SPINE FINDINGS Alignment: Mild curvature of the spine and mild straightening of the spine may be due to position or spasm. Also could be related to degenerative changes. Skull base and vertebrae: No acute fracture. No primary bone lesion or focal pathologic process. Soft tissues and spinal canal: No prevertebral fluid or swelling. No visible canal hematoma. Disc levels: Multilevel degenerative changes greatest at C4-5 with 2 mm retrolisthesis of C4 on C5. Uncovertebral degenerative changes and osteophyte formation at C4-5, C5-6 and C6-7.  Mild-to-moderate facet arthropathy greatest at C3-4 on the RIGHT. Upper chest: Choose 1 Other: None IMPRESSION: 1. No acute intracranial abnormality. 2. Further evolution of a RIGHT frontal lobe infarct with volume loss and extensive white matter disease in the high high RIGHT posterior frontal lobe. Signs of prior LEFT occipital infarct as well. 3. Interval  development of small areas of infarct along the high RIGHT central sulcus, these have a chronic appearance. 4. No evidence of acute fracture or subluxation of the cervical spine. 5. Multilevel degenerative changes of the cervical spine greatest at C4-5 with 2 mm retrolisthesis of C4 on C5. Electronically Signed   By: Zetta Bills M.D.   On: 04/24/2020 10:05   DG Knee Complete 4 Views Left  Result Date: 04/24/2020 CLINICAL DATA:  Pain following fall EXAM: LEFT KNEE - COMPLETE 4+ VIEW COMPARISON:  None. FINDINGS: Frontal, oblique, and lateral views obtained. Bones osteoporotic. No fracture or dislocation. No joint effusion. Moderate joint space narrowing medially. No erosive change. IMPRESSION: Moderate joint space narrowing medially. Osteoporosis. No fracture, dislocation, or evident joint effusion. Electronically Signed   By: Lowella Grip III M.D.   On: 04/24/2020 09:37   DG Hip Unilat W or Wo Pelvis 2-3 Views Left  Result Date: 04/24/2020 CLINICAL DATA:  Pain following fall EXAM: DG HIP (WITH OR WITHOUT PELVIS) 2-3V LEFT COMPARISON:  None. FINDINGS: Frontal pelvis as well as frontal and lateral left hip images were obtained. There is a comminuted intertrochanteric femur fracture on the left with varus angulation at the fracture site. There is avulsion of a portion of the lesser trochanter on the left. No other fracture. No dislocation. There is moderate symmetric narrowing of each hip joint. There is diffuse underlying osteoporosis. There have been kyphoplasty procedures in the lower lumbar spine as well as bilateral sacroplasty procedures.  IMPRESSION: Comminuted intertrochanteric femur fracture on the left with varus angulation at the fracture site and avulsion of a portion of the lesser trochanter on the left. Narrowing of each hip joint.  Bones diffusely osteoporotic. Previous sacroplasty and lower lumbar kyphoplasty procedures. Electronically Signed   By: Lowella Grip III M.D.   On: 04/24/2020 09:35    EKG: Independently reviewed.  Normal sinus rhythm LVH  Assessment/Plan Principal Problem:   Intertrochanteric fracture of left femur, closed, initial encounter ([place]) Active Problems:   Mixed Alzheimer's and vascular dementia ([place])   Essential (primary) hypertension   Fall      Status post fall Patient is status post fall with intertrochanteric fracture of left femur Immobilize left lower extremity Place patient on fall precautions Pain control We will request orthopedic consult   Mixed Alzheimer's and vascular dementia Continue Citalopram, Seroquel, Trazodone and Memantine   Hypertension Blood pressure is stable Continue Lisinopril    DVT prophylaxis: SCD Code Status: DNR Family Communication: Greater than 50% of time was spent discussing patient's condition and plan of care with her daughter [name] at the bedside. All questions and concerns have been addressed. Code status was discussed and patient is a DNR Disposition Plan: Back to previous home environment Consults called: Orthopedics     Tochukwu Agbata MD [facility]     04/24/2020, 10:40 AM

## 2020-04-24 NOTE — ED Notes (Signed)
Report given to Paige RN.

## 2020-04-24 NOTE — ED Notes (Signed)
Patient's posey alarm goes off with patient's movement. Request put in for hospital bed. Daughter states patient's meds need to be crushed and put in applesauce.

## 2020-04-24 NOTE — TOC Initial Note (Signed)
Transition of Care [facility]) - Initial/Assessment Note    Patient Details  Name: Gabrielle Crosby MRN: [id number] Date of Birth: 02-21-1933  [facility] of [facility]) CM/SW Contact:    Anselm Pancoast, RN Phone Number: 04/24/2020, 3:29 PM  Clinical Narrative:                 Spoke with patients daughter who is eager for patient to return to current ALF after brief SNF stay. Patient is incontinent of bowel and bladder. Daughter is very emotional and nervous about her mother testing positive and not showing any symptoms. Daughter is primary caregiver but is unable to take responsibility for patient at home due to her husband who is very sick as well. Daughter requesting callback once patient is assigned to a room.   Expected Discharge Plan: Skilled Nursing Facility Barriers to Discharge: Continued Medical Work up   Patient Goals and CMS Choice Patient states their goals for this hospitalization and ongoing recovery are:: Get back to ALF/SNF      Expected Discharge Plan and Services Expected Discharge Plan: [place]       Living arrangements for the past 2 months: [place]                                      Prior Living Arrangements/Services Living arrangements for the past 2 months: [place] Lives with:: Facility Resident Patient language and need for interpreter reviewed:: Yes Do you feel safe going back to the place where you live?: Yes      Need for Family Participation in Patient Care: Yes (Comment) Care giver support system in place?: Yes (comment) Current home services: DME(walker, wheelchair) Criminal Activity/Legal Involvement Pertinent to Current Situation/Hospitalization: No - Comment as needed  Activities of Daily Living      Permission Sought/Granted Permission sought to share information with : Facility Art therapist granted to share information with : Yes, Verbal Permission  Granted  Share Information with NAME: SNF facilities           Emotional Assessment Appearance:: Appears stated age Attitude/Demeanor/Rapport: Unable to Assess Affect (typically observed): Unable to Assess Orientation: : Oriented to Self Alcohol / Substance Use: Never Used Psych Involvement: No (comment)  Admission diagnosis:  Intertrochanteric fracture of left femur [facility]) [S72.142A] Patient Active Problem List   Diagnosis Date Noted  . Intertrochanteric fracture of left femur, closed, initial encounter ([place]) 04/24/2020  . Fall 04/24/2020  . Intertrochanteric fracture of left femur ([place]) 04/24/2020  . Fecal impaction ([place])   . Abnormal findings of gastrointestinal tract   . Intractable nausea and vomiting 10/20/2019  . Aspiration pneumonia ([place]) 10/20/2019  . Bilateral hydronephrosis 10/20/2019  . Elevated troponin 10/20/2019  . HLD (hyperlipidemia) 07/25/2019  . GERD (gastroesophageal reflux disease) 07/25/2019  . Facial droop 07/25/2019  . Loss of memory 07/26/2016  . Anxiety 06/26/2016  . Bradycardia 01/02/2015  . H/O deep venous thrombosis 01/02/2015  . MI (mitral incompetence) 01/02/2015  . Near syncope 01/02/2015  . Malignant neoplasm of urinary bladder ([place]) 08/22/2014  . Mixed Alzheimer's and vascular dementia ([place]) 08/22/2014  . Essential (primary) hypertension 08/22/2014  . Adaptive colitis 08/22/2014  . Lacunar infarction ([place]) 08/22/2014  . Combined fat and carbohydrate induced hyperlipemia 08/22/2014  . Allergic rhinitis, seasonal 08/22/2014   PCP:  Toni Arthurs, NP Pharmacy:  No Pharmacies Listed    Social Determinants of  Health (SDOH) Interventions    Readmission Risk Interventions No flowsheet data found.

## 2020-04-24 NOTE — ED Provider Notes (Signed)
[facility] Emergency Department Provider Note  ____________________________________________  Time seen: Approximately 9:37 AM  I have reviewed the triage vital signs and the nursing notes.   HISTORY  Chief Complaint Fall    Level 5 Caveat: Portions of the History and Physical including HPI and review of systems are unable to be completely obtained due to patient being a poor historian due to advanced dementia  HPI Gabrielle Crosby is a 86 y.o. female with a history of dementia hypertension stroke seizures who is brought to the ED today due to an unwitnessed fall resulting in left hip pain and bruising of the forehead and left eye.  Unknown time, unknown reason for the fall.  Patient's not able to relate any specific symptoms but does endorse left hip pain with palpation.       Past Medical History:  Diagnosis Date  . Arthritis    hands  . Bladder cancer ([place])   . Brain bleed ([place]) 2019   d/t light strokes, high bp, seizures. brain has shrunk per dr. Manuella Ghazi and mri  . Cancer ([place])    BLADDER X 2  . Dementia ([place]) 2019   dementia progressing  . Depression   . GERD (gastroesophageal reflux disease)    chokes easily even on own saliva and then has panic attack  . Hyperlipidemia   . Hypertension 2019   no medications.  no htn per daughter  . Seizures [facility])    family not aware of any seizures and no treatment  . Stroke [facility]) 2019   undiagnosed but per neuro, she has had them causing vascular dementia  . Wears dentures    partial upper and lower     Patient Active Problem List   Diagnosis Date Noted  . Fecal impaction ([place])   . Abnormal findings of gastrointestinal tract   . Intractable nausea and vomiting 10/20/2019  . Aspiration pneumonia ([place]) 10/20/2019  . Bilateral hydronephrosis 10/20/2019  . Elevated troponin 10/20/2019  . HLD (hyperlipidemia) 07/25/2019  . GERD (gastroesophageal reflux disease) 07/25/2019  . Facial droop 07/25/2019   . Loss of memory 07/26/2016  . Anxiety 06/26/2016  . Bradycardia 01/02/2015  . H/O deep venous thrombosis 01/02/2015  . MI (mitral incompetence) 01/02/2015  . Near syncope 01/02/2015  . Malignant neoplasm of urinary bladder ([place]) 08/22/2014  . Mixed Alzheimer's and vascular dementia ([place]) 08/22/2014  . Essential (primary) hypertension 08/22/2014  . Adaptive colitis 08/22/2014  . Lacunar infarction ([place]) 08/22/2014  . Combined fat and carbohydrate induced hyperlipemia 08/22/2014  . Allergic rhinitis, seasonal 08/22/2014     Past Surgical History:  Procedure Laterality Date  . ABDOMINAL HYSTERECTOMY Bilateral    BILATERAL s&o  . BACK SURGERY    . BLADDER SURGERY N/A 2015   REMOVAL OF BLADDER CANCER X 2  . BOWEL RESECTION N/A 1999   SMALL BOWEL OBSTRUCTION  . CHOLECYSTECTOMY    . COLONOSCOPY N/A 11/06/2015   Procedure: COLONOSCOPY;  Surgeon: Hulen Luster, MD;  Location: [facility];  Service: Gastroenterology;  Laterality: N/A;  . CYSTOSCOPY WITH BIOPSY N/A 04/17/2015   Procedure: CYSTOSCOPY WITH BIOPSY;  Surgeon: Hollice Espy, MD;  Location: [facility];  Service: Urology;  Laterality: N/A;  . KYPHOPLASTY N/A 12/20/2017   Procedure: [id number];  Surgeon: Hessie Knows, MD;  Location: [facility];  Service: Orthopedics;  Laterality: N/A;  . KYPHOPLASTY N/A 04/21/2018   Procedure: KYPHOPLASTY-L2,L3,L4,L5;  Surgeon: Hessie Knows, MD;  Location: [facility];  Service: Orthopedics;  Laterality: N/A;  .  SACROPLASTY N/A 11/03/2018   Procedure: Dillard Cannon;  Surgeon: Hessie Knows, MD;  Location: [facility];  Service: Orthopedics;  Laterality: N/A;     Prior to Admission medications   Medication Sig Start Date End Date Taking? Authorizing Provider  acetaminophen (TYLENOL) 500 MG tablet Take 500 mg by mouth 2 (two) times daily.     [provider]  aspirin EC 81 MG tablet Take 81 mg by mouth daily.    [provider]  bisacodyl (DULCOLAX) 10 MG suppository Place 1  suppository (10 mg total) rectally daily. 10/25/19   Ivor Costa, MD  Cholecalciferol (VITAMIN D3) 50 MCG (2000 UT) capsule Take 2,000 Units by mouth daily.  05/10/19   [provider]  citalopram (CELEXA) 20 MG tablet Take 20 mg by mouth daily.     [provider]  docusate sodium (COLACE) 50 MG capsule Take 50 mg by mouth 2 (two) times daily as needed for mild constipation.    [provider]  lisinopril (ZESTRIL) 5 MG tablet Take 1 tablet (5 mg total) by mouth daily. 07/29/19   Gouru, Illene Silver, MD  lovastatin (MEVACOR) 20 MG tablet Take 20 mg by mouth at bedtime.    [provider]  Magnesium 100 MG CAPS Take 100 mg by mouth daily.     [provider]  memantine (NAMENDA) 10 MG tablet Take 10 mg by mouth 2 (two) times daily.  11/05/16   [provider]  ondansetron (ZOFRAN) 4 MG tablet Take 1 tablet (4 mg total) by mouth daily as needed for nausea or vomiting. 10/24/19 10/23/20  Ivor Costa, MD  polyethylene glycol (MIRALAX / GLYCOLAX) 17 g packet Take 17 g by mouth daily. 10/25/19   Ivor Costa, MD  QUEtiapine (SEROQUEL) 25 MG tablet Take 50 mg by mouth 2 (two) times daily. 25 mg qam  50 mg qhs 05/10/19 05/09/20  [provider]  traZODone (DESYREL) 50 MG tablet Take 50 mg by mouth at bedtime.    [provider]  vitamin B-12 (CYANOCOBALAMIN) 1000 MCG tablet Take 1,000 mcg by mouth daily.    [provider]     Allergies Atenolol and Donepezil hcl   Family History  Family history unknown: Yes    Social History Social History   Tobacco Use  . Smoking status: Never Smoker  . Smokeless tobacco: Never Used  Substance Use Topics  . Alcohol use: Never    Alcohol/week: 0.0 standard drinks  . Drug use: No    Review of Systems Level 5 Caveat: Portions of the History and Physical including HPI and review of systems are unable to be completely obtained due to patient being a poor historian due to advanced  dementia  Constitutional:   No known fever.  ENT:   No rhinorrhea. Cardiovascular:   No chest pain or syncope. Respiratory:   No dyspnea or cough. Gastrointestinal:   Negative for abdominal pain, vomiting and diarrhea.  Musculoskeletal: Left hip pain ____________________________________________   PHYSICAL EXAM:  VITAL SIGNS: ED Triage Vitals  Enc Vitals Group     BP 04/24/20 0816 136/72     Pulse Rate 04/24/20 0816 87     Resp 04/24/20 0816 18     Temp 04/24/20 0816 98.6 F (37 C)     Temp Source 04/24/20 0816 Axillary     SpO2 04/24/20 0816 100 %     Weight 04/24/20 0818 88 lb (39.9 kg)     Height --      Head  Circumference --      Peak Flow --      Pain Score --      Pain Loc --      Pain Edu? --      Excl. in [place]? --     Vital signs reviewed, nursing assessments reviewed.   Constitutional:   Awake, not alert, not oriented. Non-toxic appearance. Eyes:   Conjunctivae are normal. EOMI. PERRL. ENT      Head:   Normocephalic with bruising of the right forehead, left infraorbital face, various bruises acute and subacute appearing..  No bony point tenderness or facial deformity, no epistaxis, no otorrhea      Nose:   No congestion/rhinnorhea.  No epistaxis      Mouth/Throat:   MMM, no pharyngeal erythema. No peritonsillar mass.       Neck:   No meningismus. Full ROM.  No midline spinal tenderness Hematological/Lymphatic/Immunilogical:   No cervical lymphadenopathy. Cardiovascular:   RRR. Symmetric bilateral radial and DP pulses.  No murmurs. Cap refill less than 2 seconds. Respiratory:   Normal respiratory effort without tachypnea/retractions. Breath sounds are clear and equal bilaterally. No wheezes/rales/rhonchi. Gastrointestinal:   Soft and nontender. Non distended. There is no CVA tenderness.  No rebound, rigidity, or guarding.  Musculoskeletal:   Tenderness about the left hip with deformity.  Tenderness of the left knee as well but joint appears stable Neurologic:    Minimal speech Motor grossly intact. No acute focal neurologic deficits are appreciated.  Skin:    Skin is warm, dry and intact. No rash noted.  No petechiae, purpura, or bullae.  ____________________________________________    LABS (pertinent positives/negatives) (all labs ordered are listed, but only abnormal results are displayed) Labs Reviewed  SARS CORONAVIRUS 2 BY RT PCR ([facility]) - Abnormal; Notable for the following components:      Result Value   SARS Coronavirus 2 POSITIVE (*)    All other components within normal limits  COMPREHENSIVE METABOLIC PANEL - Abnormal; Notable for the following components:   Glucose, Bld 128 (*)    Calcium 8.5 (*)    Albumin 3.3 (*)    AST 50 (*)    All other components within normal limits  CBC WITH DIFFERENTIAL/PLATELET - Abnormal; Notable for the following components:   RBC 3.68 (*)    Hemoglobin 11.1 (*)    HCT 35.4 (*)    RDW 16.4 (*)    All other components within normal limits  URINALYSIS, COMPLETE (UACMP) WITH MICROSCOPIC - Abnormal; Notable for the following components:   Color, Urine YELLOW (*)    APPearance CLOUDY (*)    All other components within normal limits  TYPE AND SCREEN   ____________________________________________   EKG  Interpreted by me Sinus rhythm rate of 90, normal axis and intervals.  Poor R wave progression.  Normal ST segments and T waves.  No acute ischemic changes.  ____________________________________________    [id number]  DG Chest 1 View  Result Date: 04/24/2020 CLINICAL DATA:  Pain following fall EXAM: CHEST  1 VIEW COMPARISON:  October 21, 2019 FINDINGS: Lungs are clear. The heart size and pulmonary vascularity are normal. No adenopathy. Bones are osteoporotic. There is aortic atherosclerosis. No pneumothorax; skin fold noted on the right. Previous kyphoplasty procedures throughout the lumbar spine region noted. IMPRESSION: Lungs clear. Cardiac  silhouette normal. Aortic Atherosclerosis (ICD10-I70.0). Electronically Signed   By: Lowella Grip III M.D.   On: 04/24/2020 09:36   DG  Knee Complete 4 Views Left  Result Date: 04/24/2020 CLINICAL DATA:  Pain following fall EXAM: LEFT KNEE - COMPLETE 4+ VIEW COMPARISON:  None. FINDINGS: Frontal, oblique, and lateral views obtained. Bones osteoporotic. No fracture or dislocation. No joint effusion. Moderate joint space narrowing medially. No erosive change. IMPRESSION: Moderate joint space narrowing medially. Osteoporosis. No fracture, dislocation, or evident joint effusion. Electronically Signed   By: Lowella Grip III M.D.   On: 04/24/2020 09:37   DG Hip Unilat W or Wo Pelvis 2-3 Views Left  Result Date: 04/24/2020 CLINICAL DATA:  Pain following fall EXAM: DG HIP (WITH OR WITHOUT PELVIS) 2-3V LEFT COMPARISON:  None. FINDINGS: Frontal pelvis as well as frontal and lateral left hip images were obtained. There is a comminuted intertrochanteric femur fracture on the left with varus angulation at the fracture site. There is avulsion of a portion of the lesser trochanter on the left. No other fracture. No dislocation. There is moderate symmetric narrowing of each hip joint. There is diffuse underlying osteoporosis. There have been kyphoplasty procedures in the lower lumbar spine as well as bilateral sacroplasty procedures. IMPRESSION: Comminuted intertrochanteric femur fracture on the left with varus angulation at the fracture site and avulsion of a portion of the lesser trochanter on the left. Narrowing of each hip joint.  Bones diffusely osteoporotic. Previous sacroplasty and lower lumbar kyphoplasty procedures. Electronically Signed   By: Lowella Grip III M.D.   On: 04/24/2020 09:35    ____________________________________________   PROCEDURES Procedures  ____________________________________________  DIFFERENTIAL DIAGNOSIS   Intracranial hemorrhage, C-spine fracture, hip fracture, knee  fracture, dehydration, electrolyte abnormality  CLINICAL IMPRESSION / ASSESSMENT AND PLAN / ED COURSE  Medications ordered in the ED: Medications - No data to display  Pertinent labs & imaging results that were available during my care of the patient were reviewed by me and considered in my medical decision making (see chart for details).   LAURELI GRONDAHL was evaluated in Emergency Department on 04/24/2020 for the symptoms described in the history of present illness. She was evaluated in the context of the global COVID-19 pandemic, which necessitated consideration that the patient might be at risk for infection with the SARS-CoV-2 virus that causes COVID-19. Institutional protocols and algorithms that pertain to the evaluation of patients at risk for COVID-19 are in a state of rapid change based on information released by regulatory bodies including the CDC and federal and state organizations. These policies and algorithms were followed during the patient's care in the ED.   Patient presents with a fall, will need CT head and neck as well as x-ray of the chest left hip and left knee for trauma evaluation.  Will check labs as well for underlying illness that may have precipitated the fall.  Vital signs are normal, she is not in distress.  ----------------------------------------- 9:42 AM on 04/24/2020 -----------------------------------------  X-ray of chest and knee are unremarkable.  X-ray of left hip interpreted by me confirms intertrochanteric fracture, radiology report reviewed as well.  Clinical Course as of Apr 25 1007  Wed Apr 24, 2020  0945 D/w ortho Dr. Rudene Christians, who will plan for OR repair of L hip fx tomorrow.    [PS]  [id number] Notified by RN of positive covid lab test.    [PS]  [phone] Hospitalist consult ordered for admission   [PS]  0959 D/w hospitlist dr. Francine Graven for admission   [PS]    Clinical Course User Index [PS] Carrie Mew, MD      ____________________________________________  FINAL CLINICAL IMPRESSION(S) / ED DIAGNOSES    Final diagnoses:  Intertrochanteric fracture of left hip, closed, initial encounter ([place])  Advanced dementia ([place])  Contusion of face, initial encounter  COVID-19 virus infection     ED Discharge Orders    None      Portions of this note were generated with dragon dictation software. Dictation errors may occur despite best attempts at proofreading.   Carrie Mew, MD 04/24/20 1008

## 2020-04-24 NOTE — ED Notes (Signed)
Daughter is at bedside. Patient was crying. Daughter request something for pain.  Patient is lying on left side.

## 2020-04-24 NOTE — ED Notes (Signed)
Daughter is at bedside. Patient is lying on the stretcher with eyes closed, but is alert and compliant with daughter's requests.

## 2020-04-24 NOTE — ED Triage Notes (Addendum)
Reports pt fell unknown time at facility  [place] ?Years? . Bruising to bilateral eyes and left hip pain/deformity. VSS with EMS. Pt "pleasantly confused" per patient.

## 2020-04-24 NOTE — ED Notes (Signed)
Bed alarm on.

## 2020-04-24 NOTE — ED Notes (Signed)
Lab at bedside due to hemolyzed type and screen specimen X2.

## 2020-04-24 NOTE — ED Notes (Signed)
Left foot is warmer, still mottled, skin blanches.

## 2020-04-24 NOTE — ED Notes (Signed)
Patient's medication were given with applesauce, larger pills were crushed. Patient drank easily through a straw.

## 2020-04-24 NOTE — ED Notes (Signed)
Date and time results received: 04/24/20 9:46 AM   Test: covid  Critical Value: positive Name of Provider Notified: Dr. Carrie Mew

## 2020-04-24 NOTE — ED Notes (Signed)
Patient's left foot is cold to the touch and mottled. This writer could not palpate a pulse. Dr. Joni Fears informed and is at bedside.

## 2020-04-24 NOTE — ED Notes (Signed)
External catheter in place, patient is dry and no urine present in suction canister.

## 2020-04-24 NOTE — TOC Initial Note (Signed)
Transition of Care [facility]) - Initial/Assessment Note    Patient Details  Name: Gabrielle Crosby MRN: [id number] Date of Birth: 1933-06-01  Transition of Care [facility]) CM/SW Contact:    Anselm Pancoast, RN Phone Number: 04/24/2020, 12:39 PM  Clinical Narrative:                 LVMM for daughter, [phone], requesting callback to discuss discharge needs.         Patient Goals and CMS Choice        Expected Discharge Plan and Services                                                Prior Living Arrangements/Services                       Activities of Daily Living      Permission Sought/Granted                  Emotional Assessment              Admission diagnosis:  Intertrochanteric fracture of left femur [facility]) [S72.142A] Patient Active Problem List   Diagnosis Date Noted  . Intertrochanteric fracture of left femur, closed, initial encounter ([place]) 04/24/2020  . Fall 04/24/2020  . Intertrochanteric fracture of left femur ([place]) 04/24/2020  . Fecal impaction ([place])   . Abnormal findings of gastrointestinal tract   . Intractable nausea and vomiting 10/20/2019  . Aspiration pneumonia ([place]) 10/20/2019  . Bilateral hydronephrosis 10/20/2019  . Elevated troponin 10/20/2019  . HLD (hyperlipidemia) 07/25/2019  . GERD (gastroesophageal reflux disease) 07/25/2019  . Facial droop 07/25/2019  . Loss of memory 07/26/2016  . Anxiety 06/26/2016  . Bradycardia 01/02/2015  . H/O deep venous thrombosis 01/02/2015  . MI (mitral incompetence) 01/02/2015  . Near syncope 01/02/2015  . Malignant neoplasm of urinary bladder ([place]) 08/22/2014  . Mixed Alzheimer's and vascular dementia ([place]) 08/22/2014  . Essential (primary) hypertension 08/22/2014  . Adaptive colitis 08/22/2014  . Lacunar infarction ([place]) 08/22/2014  . Combined fat and carbohydrate induced hyperlipemia 08/22/2014  . Allergic rhinitis, seasonal 08/22/2014   PCP:  Toni Arthurs,  NP Pharmacy:  No Pharmacies Listed    Social Determinants of Health (SDOH) Interventions    Readmission Risk Interventions No flowsheet data found.

## 2020-04-24 NOTE — ED Notes (Addendum)
Patient moved to hospital bed by ED techs [place] and [place]. Patient is being fed lunch tray at this time. Patient appears comfortable at this time. Patient removes pulse ox.  Daughter has left the bedside, but left contact information at bedside. Patient ate approximately 75% of lunch tray per ED tech.

## 2020-04-24 NOTE — ED Notes (Signed)
Pt. Is from  [place], [place]

## 2020-04-24 NOTE — Consult Note (Signed)
Reason for Consult: Left intertrochanteric hip fracture Referring Physician: Dr. Cooper Render is an 86 y.o. female.  HPI: Patient is a 86 year old of treated in the past for osteoporotic fractures including kyphoplasty and sacral plasty.  She suffered an unwitnessed fall and was brought to the emergency room where she was found to have a displaced comminuted intertrochanteric hip fracture.  Her condition has deteriorated and she now is quite confused living in a nursing home.  Past Medical History:  Diagnosis Date  . Arthritis    hands  . Bladder cancer ([place])   . Brain bleed ([place]) 2019   d/t light strokes, high bp, seizures. brain has shrunk per dr. Manuella Ghazi and mri  . Cancer ([place])    BLADDER X 2  . Dementia ([place]) 2019   dementia progressing  . Depression   . GERD (gastroesophageal reflux disease)    chokes easily even on own saliva and then has panic attack  . Hyperlipidemia   . Hypertension 2019   no medications.  no htn per daughter  . Seizures [facility])    family not aware of any seizures and no treatment  . Stroke [facility]) 2019   undiagnosed but per neuro, she has had them causing vascular dementia  . Wears dentures    partial upper and lower    Past Surgical History:  Procedure Laterality Date  . ABDOMINAL HYSTERECTOMY Bilateral    BILATERAL s&o  . BACK SURGERY    . BLADDER SURGERY N/A 2015   REMOVAL OF BLADDER CANCER X 2  . BOWEL RESECTION N/A 1999   SMALL BOWEL OBSTRUCTION  . CHOLECYSTECTOMY    . COLONOSCOPY N/A 11/06/2015   Procedure: COLONOSCOPY;  Surgeon: Hulen Luster, MD;  Location: [facility];  Service: Gastroenterology;  Laterality: N/A;  . CYSTOSCOPY WITH BIOPSY N/A 04/17/2015   Procedure: CYSTOSCOPY WITH BIOPSY;  Surgeon: Hollice Espy, MD;  Location: [facility];  Service: Urology;  Laterality: N/A;  . KYPHOPLASTY N/A 12/20/2017   Procedure: [id number];  Surgeon: Hessie Knows, MD;  Location: [facility];  Service: Orthopedics;  Laterality: N/A;  .  KYPHOPLASTY N/A 04/21/2018   Procedure: KYPHOPLASTY-L2,L3,L4,L5;  Surgeon: Hessie Knows, MD;  Location: [facility];  Service: Orthopedics;  Laterality: N/A;  . SACROPLASTY N/A 11/03/2018   Procedure: Dillard Cannon;  Surgeon: Hessie Knows, MD;  Location: [facility];  Service: Orthopedics;  Laterality: N/A;    Family History  Family history unknown: Yes    Social History:  reports that she has never smoked. She has never used smokeless tobacco. She reports that she does not drink alcohol or use drugs.  Allergies:  Allergies  Allergen Reactions  . Atenolol Other (See Comments)    Bradycardia   . Donepezil Hcl Other (See Comments)    Mental changes, "made me crazy"    Medications: I have reviewed the patient's current medications.  Results for orders placed or performed during the hospital encounter of 04/24/20 (from the past 48 hour(s))  Comprehensive metabolic panel     Status: Abnormal   Collection Time: 04/24/20  8:22 AM  Result Value Ref Range   Sodium 142 135 - 145 mmol/L   Potassium 3.9 3.5 - 5.1 mmol/L    Comment: HEMOLYSIS AT THIS LEVEL MAY AFFECT RESULT   Chloride 107 98 - 111 mmol/L   CO2 29 22 - 32 mmol/L   Glucose, Bld 128 (H) 70 - 99 mg/dL    Comment: Glucose reference range applies only to samples taken after  fasting for at least 8 hours.   BUN 15 8 - 23 mg/dL   Creatinine, Ser 0.46 0.44 - 1.00 mg/dL   Calcium 8.5 (L) 8.9 - 10.3 mg/dL   Total Protein 7.0 6.5 - 8.1 g/dL   Albumin 3.3 (L) 3.5 - 5.0 g/dL   AST 50 (H) 15 - 41 U/L   ALT 27 0 - 44 U/L   Alkaline Phosphatase 120 38 - 126 U/L   Total Bilirubin 0.8 0.3 - 1.2 mg/dL   GFR calc non Af Amer >60 >60 mL/min   GFR calc Af Amer >60 >60 mL/min   Anion gap 6 5 - 15    Comment: Performed at [facility], [address] [place], [place] [address]  CBC with Differential     Status: Abnormal   Collection Time: 04/24/20  8:22 AM  Result Value Ref Range   WBC 6.7 4.0 - 10.5 K/uL   RBC 3.68 (L) 3.87 - 5.11  MIL/uL   Hemoglobin 11.1 (L) 12.0 - 15.0 g/dL   HCT 35.4 (L) 36.0 - 46.0 %   MCV 96.2 80.0 - 100.0 fL   MCH 30.2 26.0 - 34.0 pg   MCHC 31.4 30.0 - 36.0 g/dL   RDW 16.4 (H) 11.5 - 15.5 %   Platelets 269 150 - 400 K/uL   nRBC 0.0 0.0 - 0.2 %   Neutrophils Relative % 72 %   Neutro Abs 4.9 1.7 - 7.7 K/uL   Lymphocytes Relative 17 %   Lymphs Abs 1.1 0.7 - 4.0 K/uL   Monocytes Relative 10 %   Monocytes Absolute 0.7 0.1 - 1.0 K/uL   Eosinophils Relative 0 %   Eosinophils Absolute 0.0 0.0 - 0.5 K/uL   Basophils Relative 0 %   Basophils Absolute 0.0 0.0 - 0.1 K/uL   Immature Granulocytes 1 %   Abs Immature Granulocytes 0.04 0.00 - 0.07 K/uL    Comment: Performed at [facility], [address] [place], [place] [address]  Urinalysis, Complete w Microscopic     Status: Abnormal   Collection Time: 04/24/20  8:44 AM  Result Value Ref Range   Color, Urine YELLOW (A) YELLOW   APPearance CLOUDY (A) CLEAR   Specific Gravity, Urine 1.017 1.005 - 1.030   pH 7.0 5.0 - 8.0   Glucose, UA NEGATIVE NEGATIVE mg/dL   Hgb urine dipstick NEGATIVE NEGATIVE   Bilirubin Urine NEGATIVE NEGATIVE   Ketones, ur NEGATIVE NEGATIVE mg/dL   Protein, ur NEGATIVE NEGATIVE mg/dL   Nitrite NEGATIVE NEGATIVE   Leukocytes,Ua NEGATIVE NEGATIVE   RBC / HPF 0-5 0 - 5 RBC/hpf   WBC, UA 0-5 0 - 5 WBC/hpf   Bacteria, UA NONE SEEN NONE SEEN   Squamous Epithelial / LPF 0-5 0 - 5   Mucus PRESENT     Comment: Performed at [facility], [address] [place], [place] [address]  SARS Coronavirus 2 by RT PCR (hospital order, performed in [facility] hospital lab) Nasopharyngeal Urine, Catheterized     Status: Abnormal   Collection Time: 04/24/20  8:44 AM   Specimen: Urine, Catheterized; Nasopharyngeal  Result Value Ref Range   SARS Coronavirus 2 POSITIVE (A) NEGATIVE    Comment: CRITICAL RESULT CALLED TO, READ BACK BY AND VERIFIED WITH: ALLY YOW AT [id number] ON 04/24/2020 [place]. (NOTE) SARS-CoV-2 target  nucleic acids are DETECTED SARS-CoV-2 RNA is generally detectable in upper respiratory specimens  during the acute phase of infection.  Positive results are indicative  of the presence of the identified virus, but do not rule out bacterial infection or co-infection with other pathogens not detected by the test.  Clinical correlation with patient history and  other diagnostic information is necessary to determine patient infection status.  The expected result is negative. Fact Sheet for Patients:   StrictlyIdeas.no  Fact Sheet for Healthcare Providers:   BankingDealers.co.za   This test is not yet approved or cleared by the [place] FDA and  has been authorized for detection and/or diagnosis of SARS-CoV-2 by FDA under an Emergency Use Authorization (EUA).  This EUA will remain in effect (meaning this t est can be used) for the duration of  the COVID-19 declaration under Section 564(b)(1) of the Act, 21 U.S.C. section 360-bbb-3(b)(1), unless the authorization is terminated or revoked sooner. Performed at [facility], [address] [place], [place] [address]   Type and screen Ordered by PROVIDER DEFAULT     Status: None   Collection Time: 04/24/20 10:40 AM  Result Value Ref Range   ABO/RH(D) O NEG    Antibody Screen NEG    Sample Expiration      04/27/2020,2359 Performed at [facility], [address], [place], [place] [address]     DG Chest 1 View  Result Date: 04/24/2020 CLINICAL DATA:  Pain following fall EXAM: CHEST  1 VIEW COMPARISON:  October 21, 2019 FINDINGS: Lungs are clear. The heart size and pulmonary vascularity are normal. No adenopathy. Bones are osteoporotic. There is aortic atherosclerosis. No pneumothorax; skin fold noted on the right. Previous kyphoplasty procedures throughout the lumbar spine region noted. IMPRESSION: Lungs clear. Cardiac silhouette normal. Aortic Atherosclerosis  (ICD10-I70.0). Electronically Signed   By: Lowella Grip III M.D.   On: 04/24/2020 09:36   CT HEAD WO CONTRAST  Result Date: 04/24/2020 CLINICAL DATA:  Ground level fall, unknown time bruising bilateral eyes and LEFT hip pain EXAM: CT HEAD WITHOUT CONTRAST CT CERVICAL SPINE WITHOUT CONTRAST TECHNIQUE: Multidetector CT imaging of the head and cervical spine was performed following the standard protocol without intravenous contrast. Multiplanar CT image reconstructions of the cervical spine were also generated. COMPARISON:  CT head from August of 2020, MRI brain 07/26/2019 and CT head 10/19/2019 FINDINGS: CT HEAD FINDINGS Brain: Further evolution of a RIGHT frontal lobe infarct with volume loss and extensive white matter disease in the high high RIGHT posterior frontal lobe. Signs of prior LEFT occipital infarct as well. Extensive atrophy as before. No acute acute intracranial abnormality. No signs of hemorrhage, mass effect, midline shift or increase in ventricular dilation. Interval development of small areas of infarct since the prior study along the high RIGHT central sulcus, these have a chronic appearance. Vascular: No hyperdense vessel or unexpected calcification. Skull: Normal. Negative for fracture or focal lesion. Sinuses/Orbits: No acute finding. Other: None. CT CERVICAL SPINE FINDINGS Alignment: Mild curvature of the spine and mild straightening of the spine may be due to position or spasm. Also could be related to degenerative changes. Skull base and vertebrae: No acute fracture. No primary bone lesion or focal pathologic process. Soft tissues and spinal canal: No prevertebral fluid or swelling. No visible canal hematoma. Disc levels: Multilevel degenerative changes greatest at C4-5 with 2 mm retrolisthesis of C4 on C5. Uncovertebral degenerative changes and osteophyte formation at C4-5, C5-6 and C6-7. Mild-to-moderate facet arthropathy greatest at C3-4 on the RIGHT. Upper chest: Choose 1 Other:  None IMPRESSION: 1. No acute intracranial abnormality. 2. Further evolution of a RIGHT frontal lobe  infarct with volume loss and extensive white matter disease in the high high RIGHT posterior frontal lobe. Signs of prior LEFT occipital infarct as well. 3. Interval development of small areas of infarct along the high RIGHT central sulcus, these have a chronic appearance. 4. No evidence of acute fracture or subluxation of the cervical spine. 5. Multilevel degenerative changes of the cervical spine greatest at C4-5 with 2 mm retrolisthesis of C4 on C5. Electronically Signed   By: Zetta Bills M.D.   On: 04/24/2020 10:05   CT CERVICAL SPINE WO CONTRAST  Result Date: 04/24/2020 CLINICAL DATA:  Ground level fall, unknown time bruising bilateral eyes and LEFT hip pain EXAM: CT HEAD WITHOUT CONTRAST CT CERVICAL SPINE WITHOUT CONTRAST TECHNIQUE: Multidetector CT imaging of the head and cervical spine was performed following the standard protocol without intravenous contrast. Multiplanar CT image reconstructions of the cervical spine were also generated. COMPARISON:  CT head from August of 2020, MRI brain 07/26/2019 and CT head 10/19/2019 FINDINGS: CT HEAD FINDINGS Brain: Further evolution of a RIGHT frontal lobe infarct with volume loss and extensive white matter disease in the high high RIGHT posterior frontal lobe. Signs of prior LEFT occipital infarct as well. Extensive atrophy as before. No acute acute intracranial abnormality. No signs of hemorrhage, mass effect, midline shift or increase in ventricular dilation. Interval development of small areas of infarct since the prior study along the high RIGHT central sulcus, these have a chronic appearance. Vascular: No hyperdense vessel or unexpected calcification. Skull: Normal. Negative for fracture or focal lesion. Sinuses/Orbits: No acute finding. Other: None. CT CERVICAL SPINE FINDINGS Alignment: Mild curvature of the spine and mild straightening of the spine may  be due to position or spasm. Also could be related to degenerative changes. Skull base and vertebrae: No acute fracture. No primary bone lesion or focal pathologic process. Soft tissues and spinal canal: No prevertebral fluid or swelling. No visible canal hematoma. Disc levels: Multilevel degenerative changes greatest at C4-5 with 2 mm retrolisthesis of C4 on C5. Uncovertebral degenerative changes and osteophyte formation at C4-5, C5-6 and C6-7. Mild-to-moderate facet arthropathy greatest at C3-4 on the RIGHT. Upper chest: Choose 1 Other: None IMPRESSION: 1. No acute intracranial abnormality. 2. Further evolution of a RIGHT frontal lobe infarct with volume loss and extensive white matter disease in the high high RIGHT posterior frontal lobe. Signs of prior LEFT occipital infarct as well. 3. Interval development of small areas of infarct along the high RIGHT central sulcus, these have a chronic appearance. 4. No evidence of acute fracture or subluxation of the cervical spine. 5. Multilevel degenerative changes of the cervical spine greatest at C4-5 with 2 mm retrolisthesis of C4 on C5. Electronically Signed   By: Zetta Bills M.D.   On: 04/24/2020 10:05   DG Knee Complete 4 Views Left  Result Date: 04/24/2020 CLINICAL DATA:  Pain following fall EXAM: LEFT KNEE - COMPLETE 4+ VIEW COMPARISON:  None. FINDINGS: Frontal, oblique, and lateral views obtained. Bones osteoporotic. No fracture or dislocation. No joint effusion. Moderate joint space narrowing medially. No erosive change. IMPRESSION: Moderate joint space narrowing medially. Osteoporosis. No fracture, dislocation, or evident joint effusion. Electronically Signed   By: Lowella Grip III M.D.   On: 04/24/2020 09:37   DG Hip Unilat W or Wo Pelvis 2-3 Views Left  Result Date: 04/24/2020 CLINICAL DATA:  Pain following fall EXAM: DG HIP (WITH OR WITHOUT PELVIS) 2-3V LEFT COMPARISON:  None. FINDINGS: Frontal pelvis as well as frontal and  lateral left hip  images were obtained. There is a comminuted intertrochanteric femur fracture on the left with varus angulation at the fracture site. There is avulsion of a portion of the lesser trochanter on the left. No other fracture. No dislocation. There is moderate symmetric narrowing of each hip joint. There is diffuse underlying osteoporosis. There have been kyphoplasty procedures in the lower lumbar spine as well as bilateral sacroplasty procedures. IMPRESSION: Comminuted intertrochanteric femur fracture on the left with varus angulation at the fracture site and avulsion of a portion of the lesser trochanter on the left. Narrowing of each hip joint.  Bones diffusely osteoporotic. Previous sacroplasty and lower lumbar kyphoplasty procedures. Electronically Signed   By: Lowella Grip III M.D.   On: 04/24/2020 09:35    Review of Systems Blood pressure 137/90, pulse (!) 54, temperature 98.6 F (37 C), temperature source Axillary, resp. rate 16, weight 39.9 kg, SpO2 92 %. Physical Exam Left leg is externally rotated and shortened she holds her right hip up in flexion.  She has a cold foot does not appear ischemic but nonpalpable pulses. Radiographs reveal comminuted displaced left intertrochanteric hip fracture Assessment/Plan: Comminuted left intertrochanteric hip fracture.  Recommendation is for ORIF for pain relief and motor mobilization.  I tried to call her daughter but was unable to get through and will try again later.  Plan on surgery tomorrow, despite her positive Covid test she cannot wait weeks for surgical care.  Hessie Knows 04/24/2020, 4:14 PM

## 2020-04-25 ENCOUNTER — Inpatient Hospital Stay: Payer: Medicare Other | Admitting: Registered Nurse

## 2020-04-25 ENCOUNTER — Encounter
Admission: EM | Disposition: A | Discharge status: Hospice | Payer: Self-pay | Source: Home / Self Care | Attending: Internal Medicine

## 2020-04-25 ENCOUNTER — Inpatient Hospital Stay: Payer: Medicare Other

## 2020-04-25 ENCOUNTER — Other Ambulatory Visit: Payer: Self-pay

## 2020-04-25 DIAGNOSIS — S72142A Displaced intertrochanteric fracture of left femur, initial encounter for closed fracture: Principal | ICD-10-CM

## 2020-04-25 HISTORY — PX: INTRAMEDULLARY (IM) NAIL INTERTROCHANTERIC: SHX5875

## 2020-04-25 LAB — BASIC METABOLIC PANEL
Anion gap: 7 (ref 5–15)
BUN: 21 mg/dL (ref 8–23)
CO2: 26 mmol/L (ref 22–32)
Calcium: 8.5 mg/dL — ABNORMAL LOW (ref 8.9–10.3)
Chloride: 108 mmol/L (ref 98–111)
Creatinine, Ser: 0.49 mg/dL (ref 0.44–1.00)
GFR calc Af Amer: >60 mL/min (ref >60)
GFR calc non Af Amer: >60 mL/min (ref >60)
Glucose, Bld: 110 mg/dL — ABNORMAL HIGH (ref 70–99)
Potassium: 3.7 mmol/L (ref 3.5–5.1)
Sodium: 141 mmol/L (ref 135–145)

## 2020-04-25 LAB — CBC
HCT: 29.9 % — ABNORMAL LOW (ref 36.0–46.0)
Hemoglobin: 9.8 g/dL — ABNORMAL LOW (ref 12.0–15.0)
MCH: 31.2 pg (ref 26.0–34.0)
MCHC: 32.8 g/dL (ref 30.0–36.0)
MCV: 95.2 fL (ref 80.0–100.0)
Platelets: 209 10*3/uL (ref 150–400)
RBC: 3.14 MIL/uL — ABNORMAL LOW (ref 3.87–5.11)
RDW: 16.6 % — ABNORMAL HIGH (ref 11.5–15.5)
WBC: 9.1 10*3/uL (ref 4.0–10.5)
nRBC: 0 % (ref 0.0–0.2)

## 2020-04-25 LAB — MRSA PCR SCREENING: MRSA by PCR: NEGATIVE

## 2020-04-25 SURGERY — FIXATION, FRACTURE, INTERTROCHANTERIC, WITH INTRAMEDULLARY ROD
Anesthesia: General | Laterality: Left

## 2020-04-25 MED ORDER — PHENYLEPHRINE HCL (PRESSORS) 10 MG/ML IV SOLN
INTRAVENOUS | Status: DC | PRN
  Administered 2020-04-25 (×3): 100 ug via INTRAVENOUS

## 2020-04-25 MED ORDER — PROPOFOL 500 MG/50ML IV EMUL
INTRAVENOUS | Status: AC
  Filled 2020-04-25: qty 50

## 2020-04-25 MED ORDER — MENTHOL 3 MG MT LOZG
1.0000 | LOZENGE | OROMUCOSAL | Status: DC | PRN
  Filled 2020-04-25: qty 9

## 2020-04-25 MED ORDER — SODIUM CHLORIDE 0.9 % IV SOLN: INTRAVENOUS | Status: DC

## 2020-04-25 MED ORDER — ACETAMINOPHEN 10 MG/ML IV SOLN
INTRAVENOUS | Status: DC | PRN
  Administered 2020-04-25: 500 mg via INTRAVENOUS

## 2020-04-25 MED ORDER — DEXAMETHASONE SODIUM PHOSPHATE 10 MG/ML IJ SOLN
INTRAMUSCULAR | Status: DC | PRN
  Administered 2020-04-25: 4 mg via INTRAVENOUS

## 2020-04-25 MED ORDER — MELATONIN 5 MG PO TABS
5.0000 mg | ORAL_TABLET | Freq: Every day | ORAL | Status: DC
  Administered 2020-04-27 – 2020-05-01 (×2): 5 mg via ORAL
  Filled 2020-04-25 (×7): qty 1

## 2020-04-25 MED ORDER — ACETAMINOPHEN 325 MG PO TABS: 325.0000 mg | ORAL_TABLET | Freq: Four times a day (QID) | ORAL | Status: DC | PRN

## 2020-04-25 MED ORDER — FENTANYL CITRATE (PF) 100 MCG/2ML IJ SOLN
INTRAMUSCULAR | Status: DC | PRN
  Administered 2020-04-25: 50 ug via INTRAVENOUS

## 2020-04-25 MED ORDER — PHENOL 1.4 % MT LIQD
1.0000 | OROMUCOSAL | Status: DC | PRN
  Filled 2020-04-25: qty 177

## 2020-04-25 MED ORDER — MAGNESIUM CITRATE PO SOLN
1.0000 | Freq: Once | ORAL | Status: DC | PRN
  Filled 2020-04-25: qty 296

## 2020-04-25 MED ORDER — ACETAMINOPHEN 10 MG/ML IV SOLN
INTRAVENOUS | Status: AC
  Filled 2020-04-25: qty 100

## 2020-04-25 MED ORDER — TRAMADOL HCL 50 MG PO TABS
50.0000 mg | ORAL_TABLET | Freq: Four times a day (QID) | ORAL | Status: DC
  Administered 2020-04-26 – 2020-05-01 (×8): 50 mg via ORAL
  Filled 2020-04-25 (×9): qty 1

## 2020-04-25 MED ORDER — HYDROCODONE-ACETAMINOPHEN 5-325 MG PO TABS
1.0000 | ORAL_TABLET | ORAL | Status: DC | PRN
  Administered 2020-05-01: 1 via ORAL
  Filled 2020-04-25: qty 1

## 2020-04-25 MED ORDER — PROPOFOL 10 MG/ML IV BOLUS
INTRAVENOUS | Status: DC | PRN
  Administered 2020-04-25 (×4): 5 mg via INTRAVENOUS
  Administered 2020-04-25: 50 mg via INTRAVENOUS

## 2020-04-25 MED ORDER — DEXAMETHASONE SODIUM PHOSPHATE 10 MG/ML IJ SOLN
INTRAMUSCULAR | Status: AC
  Filled 2020-04-25: qty 1

## 2020-04-25 MED ORDER — METOCLOPRAMIDE HCL 5 MG/ML IJ SOLN: 5.0000 mg | Freq: Three times a day (TID) | INTRAMUSCULAR | Status: DC | PRN

## 2020-04-25 MED ORDER — CLONAZEPAM 0.5 MG PO TABS
0.5000 mg | ORAL_TABLET | Freq: Two times a day (BID) | ORAL | Status: DC
  Administered 2020-04-27 – 2020-05-01 (×2): 0.5 mg via ORAL
  Filled 2020-04-25 (×4): qty 1

## 2020-04-25 MED ORDER — BISACODYL 10 MG RE SUPP: 10.0000 mg | Freq: Every day | RECTAL | Status: DC | PRN

## 2020-04-25 MED ORDER — ASPIRIN EC 325 MG PO TBEC
325.0000 mg | DELAYED_RELEASE_TABLET | Freq: Every day | ORAL | Status: DC
  Filled 2020-04-25: qty 1

## 2020-04-25 MED ORDER — MORPHINE SULFATE (PF) 2 MG/ML IV SOLN
0.5000 mg | INTRAVENOUS | Status: DC | PRN
  Administered 2020-04-26 – 2020-05-01 (×16): 1 mg via INTRAVENOUS
  Filled 2020-04-25 (×18): qty 1

## 2020-04-25 MED ORDER — ONDANSETRON HCL 4 MG PO TABS: 4.0000 mg | ORAL_TABLET | Freq: Four times a day (QID) | ORAL | Status: DC | PRN

## 2020-04-25 MED ORDER — SUCCINYLCHOLINE CHLORIDE 20 MG/ML IJ SOLN
INTRAMUSCULAR | Status: DC | PRN
  Administered 2020-04-25: 60 mg via INTRAVENOUS

## 2020-04-25 MED ORDER — FENTANYL CITRATE (PF) 100 MCG/2ML IJ SOLN
INTRAMUSCULAR | Status: AC
  Filled 2020-04-25: qty 2

## 2020-04-25 MED ORDER — ONDANSETRON HCL 4 MG/2ML IJ SOLN
INTRAMUSCULAR | Status: AC
  Filled 2020-04-25: qty 2

## 2020-04-25 MED ORDER — DOCUSATE SODIUM 100 MG PO CAPS
100.0000 mg | ORAL_CAPSULE | Freq: Two times a day (BID) | ORAL | Status: DC
  Filled 2020-04-25 (×3): qty 1

## 2020-04-25 MED ORDER — LABETALOL HCL 5 MG/ML IV SOLN
INTRAVENOUS | Status: DC | PRN
  Administered 2020-04-25: 10 mg via INTRAVENOUS

## 2020-04-25 MED ORDER — ALUM & MAG HYDROXIDE-SIMETH 200-200-20 MG/5ML PO SUSP: 30.0000 mL | ORAL | Status: DC | PRN

## 2020-04-25 MED ORDER — PROPOFOL 500 MG/50ML IV EMUL: INTRAVENOUS | Status: DC | PRN

## 2020-04-25 MED ORDER — ONDANSETRON HCL 4 MG/2ML IJ SOLN: 4.0000 mg | Freq: Four times a day (QID) | INTRAMUSCULAR | Status: DC | PRN

## 2020-04-25 MED ORDER — BOOST PO LIQD: 237.0000 mL | Freq: Three times a day (TID) | ORAL | Status: DC

## 2020-04-25 MED ORDER — ADULT MULTIVITAMIN W/MINERALS CH
1.0000 | ORAL_TABLET | Freq: Every day | ORAL | Status: DC
  Administered 2020-04-28: 1 via ORAL
  Filled 2020-04-25 (×2): qty 1

## 2020-04-25 MED ORDER — FLUTICASONE PROPIONATE 50 MCG/ACT NA SUSP
1.0000 | Freq: Every day | NASAL | Status: DC
  Administered 2020-04-26 – 2020-05-02 (×2): 1 via NASAL
  Filled 2020-04-25: qty 16

## 2020-04-25 MED ORDER — NEOMYCIN-POLYMYXIN B GU 40-200000 IR SOLN
Status: DC | PRN
  Administered 2020-04-25: 2 mL

## 2020-04-25 MED ORDER — MAGNESIUM HYDROXIDE 400 MG/5ML PO SUSP
30.0000 mL | Freq: Every day | ORAL | Status: DC | PRN
  Filled 2020-04-25: qty 30

## 2020-04-25 MED ORDER — LABETALOL HCL 5 MG/ML IV SOLN
INTRAVENOUS | Status: AC
  Filled 2020-04-25: qty 4

## 2020-04-25 MED ORDER — CEFAZOLIN SODIUM-DEXTROSE 1-4 GM/50ML-% IV SOLN
INTRAVENOUS | Status: DC | PRN
  Administered 2020-04-25: 1 g via INTRAVENOUS

## 2020-04-25 MED ORDER — ENOXAPARIN SODIUM 30 MG/0.3ML ~~LOC~~ SOLN
30.0000 mg | SUBCUTANEOUS | Status: DC
  Administered 2020-04-26 – 2020-05-02 (×7): 30 mg via SUBCUTANEOUS
  Filled 2020-04-25 (×6): qty 0.3

## 2020-04-25 MED ORDER — SODIUM CHLORIDE 0.9 % IV SOLN
INTRAVENOUS | Status: DC | PRN
  Administered 2020-04-25: 25 ug/min via INTRAVENOUS

## 2020-04-25 MED ORDER — METOCLOPRAMIDE HCL 5 MG PO TABS: 5.0000 mg | ORAL_TABLET | Freq: Three times a day (TID) | ORAL | Status: DC | PRN

## 2020-04-25 MED ORDER — HYDROCODONE-ACETAMINOPHEN 7.5-325 MG PO TABS: 1.0000 | ORAL_TABLET | ORAL | Status: DC | PRN

## 2020-04-25 MED ORDER — CEFAZOLIN SODIUM-DEXTROSE 1-4 GM/50ML-% IV SOLN
1.0000 g | Freq: Four times a day (QID) | INTRAVENOUS | Status: AC
  Administered 2020-04-25 – 2020-04-26 (×3): 1 g via INTRAVENOUS
  Filled 2020-04-25 (×5): qty 50

## 2020-04-25 MED ORDER — SUGAMMADEX SODIUM 200 MG/2ML IV SOLN
INTRAVENOUS | Status: DC | PRN
  Administered 2020-04-25: 200 mg via INTRAVENOUS

## 2020-04-25 MED ORDER — LACTATED RINGERS IV SOLN: INTRAVENOUS | Status: DC | PRN

## 2020-04-25 MED ORDER — ONDANSETRON HCL 4 MG/2ML IJ SOLN
INTRAMUSCULAR | Status: DC | PRN
  Administered 2020-04-25: 4 mg via INTRAVENOUS

## 2020-04-25 SURGICAL SUPPLY — 34 items
BIT DRILL 4.3MMS DISTAL GRDTED (BIT) ×1 IMPLANT
CANISTER SUCT 1200ML W/VALVE (MISCELLANEOUS) IMPLANT
CHLORAPREP W/TINT 26 (MISCELLANEOUS) ×2 IMPLANT
COVER WAND RF STERILE (DRAPES) ×2 IMPLANT
DRAPE 3/4 80X56 (DRAPES) ×2 IMPLANT
DRAPE U-SHAPE 47X51 STRL (DRAPES) ×2 IMPLANT
DRILL 4.3MMS DISTAL GRADUATED (BIT) ×2
DRSG OPSITE POSTOP 3X4 (GAUZE/BANDAGES/DRESSINGS) ×6 IMPLANT
GLOVE BIOGEL PI IND STRL 9 (GLOVE) ×1 IMPLANT
GLOVE BIOGEL PI INDICATOR 9 (GLOVE) ×1
GLOVE SURG SYN 9.0  PF PI (GLOVE) ×1
GLOVE SURG SYN 9.0 PF PI (GLOVE) ×1 IMPLANT
GOWN SRG 2XL LVL 4 RGLN SLV (GOWNS) ×1 IMPLANT
GOWN STRL NON-REIN 2XL LVL4 (GOWNS) ×1
GOWN STRL REUS W/ TWL LRG LVL3 (GOWN DISPOSABLE) ×1 IMPLANT
GOWN STRL REUS W/TWL LRG LVL3 (GOWN DISPOSABLE) ×1
GUIDEPIN VERSANAIL DSP 3.2X444 (ORTHOPEDIC DISPOSABLE SUPPLIES) ×2 IMPLANT
GUIDEWIRE BALL NOSE 100CM (WIRE) ×2 IMPLANT
HIP FRA NAIL LAG SCREW 10.5X90 (Orthopedic Implant) ×2 IMPLANT
KIT TURNOVER KIT A (KITS) ×2 IMPLANT
MAT ABSORB  FLUID 56X50 GRAY (MISCELLANEOUS) ×1
MAT ABSORB FLUID 56X50 GRAY (MISCELLANEOUS) ×1 IMPLANT
NAIL HIP FRA AFFIX 130X9X340 L (Nail) ×2 IMPLANT
NEEDLE FILTER BLUNT 18X 1/2SAF (NEEDLE) ×1
NEEDLE FILTER BLUNT 18X1 1/2 (NEEDLE) ×1 IMPLANT
NS IRRIG 500ML POUR BTL (IV SOLUTION) ×2 IMPLANT
PACK HIP COMPR (MISCELLANEOUS) ×2 IMPLANT
SCALPEL PROTECTED #15 DISP (BLADE) ×4 IMPLANT
SCREW BONE CORTICAL 5.0X42 (Screw) ×2 IMPLANT
SCREW LAG HIP FRA NAIL 10.5X90 (Orthopedic Implant) ×1 IMPLANT
STAPLER SKIN PROX 35W (STAPLE) ×2 IMPLANT
SUT VIC AB 1 CT1 36 (SUTURE) ×2 IMPLANT
SUT VIC AB 2-0 CT1 (SUTURE) ×2 IMPLANT
SYR 10ML LL (SYRINGE) IMPLANT

## 2020-04-25 NOTE — Anesthesia Postprocedure Evaluation (Signed)
Anesthesia Post Note  Patient: Gabrielle Crosby  Procedure(s) Performed: INTRAMEDULLARY (IM) NAIL INTERTROCHANTRIC (Left )  Patient location during evaluation: PACU Anesthesia Type: General Level of consciousness: lethargic (lethargic and not cooperative, as she was preoperatively) Pain management: pain level controlled Vital Signs Assessment: post-procedure vital signs reviewed and stable Respiratory status: spontaneous breathing, nonlabored ventilation and respiratory function stable Cardiovascular status: blood pressure returned to baseline and stable Postop Assessment: no apparent nausea or vomiting Anesthetic complications: no Comments: Patient recovered in the OR due to COVID+ status.     Last Vitals:  Vitals:   04/25/20 1400 04/25/20 1500  BP:    Pulse: 100 (!) 108  Resp: (!) 26 16  Temp:    SpO2: 96% 99%    Last Pain:  Vitals:   04/25/20 0821  TempSrc: Axillary  PainSc:                  Arita Miss

## 2020-04-25 NOTE — Transfer of Care (Deleted)
Immediate Anesthesia Transfer of Care Note  Patient: Gabrielle Crosby  Procedure(s) Performed: INTRAMEDULLARY (IM) NAIL INTERTROCHANTRIC (Left )  Patient Location: PACU  Anesthesia Type:General  Level of Consciousness: awake, alert  and oriented  Airway & Oxygen Therapy: Patient Spontanous Breathing  Post-op Assessment: Post -op Vital signs reviewed and stable  Post vital signs: stable  Last Vitals:  Vitals Value Taken Time  BP    Temp    Pulse    Resp    SpO2      Last Pain:  Vitals:   04/25/20 0821  TempSrc: Axillary  PainSc:          Complications: No apparent anesthesia complications

## 2020-04-25 NOTE — Progress Notes (Signed)
PROGRESS NOTE    Gabrielle Crosby  [id number] DOB: 11-10-1933 DOA: 04/24/2020 PCP: Toni Arthurs, NP    Brief Narrative:  Gabrielle Crosby is a 86 y.o. female with medical history significant for advanced dementia, hypertension, seizures and stroke who was brought into the emergency room by EMS after an unwitnessed fall in the skilled nursing facility resulting in left hip pain and bruising of the forehead and left eye.  Patient is unable to provide any history but complains of left hip pain with palpitation.  Time of fall and reason for fall is unknown since patient is a poor historian. Patient's COVID 19 test is positive but per daughter she has completed her vaccination series   Consultants:   Orthopedics  Procedures:   Antimicrobials:   Cefazolin x1   Subjective: Lying sideways in bed this am, really not communicative with me too much. Moans a little. No acute issues noted  Objective: Vitals:   04/25/20 1200 04/25/20 1300 04/25/20 1400 04/25/20 1500  BP:      Pulse: 100 (!) 101 100 (!) 108  Resp: (!) 22 15 (!) 26 16  Temp:      TempSrc:      SpO2: 96% 97% 96% 99%  Weight:        Intake/Output Summary (Last 24 hours) at 04/25/2020 1854 Last data filed at 04/25/2020 1813 Gross per 24 hour  Intake 300 ml  Output 900 ml  Net -600 ml   Filed Weights   04/24/20 0818  Weight: 39.9 kg    Examination:  General exam: Appears calm and comfortable , nad, not too communicative with me Respiratory system: Clear to auscultation. With poor respiratory effort normal. Cardiovascular system: S1 & S2 heard, RRR. No JVD, murmurs, rubs, gallops or clicks.  Gastrointestinal system: Abdomen is nondistended, soft and nontender.  Normal bowel sounds heard. Central nervous system: Unable to assess Extremities: No edema Skin: Warm and dry Psychiatry: Unable to assess    Data Reviewed: I have personally reviewed following labs and imaging studies  CBC: Recent Labs  Lab  04/24/20 0822 04/25/20 0519  WBC 6.7 9.1  NEUTROABS 4.9  --   HGB 11.1* 9.8*  HCT 35.4* 29.9*  MCV 96.2 95.2  PLT 269 [id number]   Basic Metabolic Panel: Recent Labs  Lab 04/24/20 0822 04/25/20 0519  NA 142 141  K 3.9 3.7  CL 107 108  CO2 29 26  GLUCOSE 128* 110*  BUN 15 21  CREATININE 0.46 0.49  CALCIUM 8.5* 8.5*   GFR: Estimated Creatinine Clearance: 33 mL/min (by C-G formula based on SCr of 0.49 mg/dL). Liver Function Tests: Recent Labs  Lab 04/24/20 0822  AST 50*  ALT 27  ALKPHOS 120  BILITOT 0.8  PROT 7.0  ALBUMIN 3.3*   No results for input(s): LIPASE, AMYLASE in the last 168 hours. No results for input(s): AMMONIA in the last 168 hours. Coagulation Profile: No results for input(s): INR, PROTIME in the last 168 hours. Cardiac Enzymes: No results for input(s): CKTOTAL, CKMB, CKMBINDEX, TROPONINI in the last 168 hours. BNP (last 3 results) No results for input(s): PROBNP in the last 8760 hours. HbA1C: No results for input(s): HGBA1C in the last 72 hours. CBG: No results for input(s): GLUCAP in the last 168 hours. Lipid Profile: No results for input(s): CHOL, HDL, LDLCALC, TRIG, CHOLHDL, LDLDIRECT in the last 72 hours. Thyroid Function Tests: No results for input(s): TSH, T4TOTAL, FREET4, T3FREE, THYROIDAB in the last 72 hours. Anemia Panel: No  results for input(s): VITAMINB12, FOLATE, FERRITIN, TIBC, IRON, RETICCTPCT in the last 72 hours. Sepsis Labs: No results for input(s): PROCALCITON, LATICACIDVEN in the last 168 hours.  Recent Results (from the past 240 hour(s))  SARS Coronavirus 2 by RT PCR (hospital order, performed in [facility] hospital lab) Nasopharyngeal Urine, Catheterized     Status: Abnormal   Collection Time: 04/24/20  8:44 AM   Specimen: Urine, Catheterized; Nasopharyngeal  Result Value Ref Range Status   SARS Coronavirus 2 POSITIVE (A) NEGATIVE Final    Comment: CRITICAL RESULT CALLED TO, READ BACK BY AND VERIFIED WITH: ALLY YOW AT [id number]  ON 04/24/2020 [place]. (NOTE) SARS-CoV-2 target nucleic acids are DETECTED SARS-CoV-2 RNA is generally detectable in upper respiratory specimens  during the acute phase of infection.  Positive results are indicative  of the presence of the identified virus, but do not rule out bacterial infection or co-infection with other pathogens not detected by the test.  Clinical correlation with patient history and  other diagnostic information is necessary to determine patient infection status.  The expected result is negative. Fact Sheet for Patients:   StrictlyIdeas.no  Fact Sheet for Healthcare Providers:   BankingDealers.co.za   This test is not yet approved or cleared by the [place] FDA and  has been authorized for detection and/or diagnosis of SARS-CoV-2 by FDA under an Emergency Use Authorization (EUA).  This EUA will remain in effect (meaning this t est can be used) for the duration of  the COVID-19 declaration under Section 564(b)(1) of the Act, 21 U.S.C. section 360-bbb-3(b)(1), unless the authorization is terminated or revoked sooner. Performed at [facility], [address] [place], [place] [address]   MRSA PCR Screening     Status: None   Collection Time: 04/25/20 10:48 AM   Specimen: Nasopharyngeal  Result Value Ref Range Status   MRSA by PCR NEGATIVE NEGATIVE Final    Comment:        The GeneXpert MRSA Assay (FDA approved for NASAL specimens only), is one component of a comprehensive MRSA colonization surveillance program. It is not intended to diagnose MRSA infection nor to guide or monitor treatment for MRSA infections. Performed at [facility], [address] [place], [place] [address]          Radiology Studies: DG Chest 1 View  Result Date: 04/24/2020 CLINICAL DATA:  Pain following fall EXAM: CHEST  1 VIEW COMPARISON:  October 21, 2019 FINDINGS: Lungs are clear. The heart size and  pulmonary vascularity are normal. No adenopathy. Bones are osteoporotic. There is aortic atherosclerosis. No pneumothorax; skin fold noted on the right. Previous kyphoplasty procedures throughout the lumbar spine region noted. IMPRESSION: Lungs clear. Cardiac silhouette normal. Aortic Atherosclerosis (ICD10-I70.0). Electronically Signed   By: Lowella Grip III M.D.   On: 04/24/2020 09:36   CT HEAD WO CONTRAST  Result Date: 04/24/2020 CLINICAL DATA:  Ground level fall, unknown time bruising bilateral eyes and LEFT hip pain EXAM: CT HEAD WITHOUT CONTRAST CT CERVICAL SPINE WITHOUT CONTRAST TECHNIQUE: Multidetector CT imaging of the head and cervical spine was performed following the standard protocol without intravenous contrast. Multiplanar CT image reconstructions of the cervical spine were also generated. COMPARISON:  CT head from August of 2020, MRI brain 07/26/2019 and CT head 10/19/2019 FINDINGS: CT HEAD FINDINGS Brain: Further evolution of a RIGHT frontal lobe infarct with volume loss and extensive white matter disease in the high high RIGHT posterior frontal lobe. Signs of prior LEFT occipital infarct as well.  Extensive atrophy as before. No acute acute intracranial abnormality. No signs of hemorrhage, mass effect, midline shift or increase in ventricular dilation. Interval development of small areas of infarct since the prior study along the high RIGHT central sulcus, these have a chronic appearance. Vascular: No hyperdense vessel or unexpected calcification. Skull: Normal. Negative for fracture or focal lesion. Sinuses/Orbits: No acute finding. Other: None. CT CERVICAL SPINE FINDINGS Alignment: Mild curvature of the spine and mild straightening of the spine may be due to position or spasm. Also could be related to degenerative changes. Skull base and vertebrae: No acute fracture. No primary bone lesion or focal pathologic process. Soft tissues and spinal canal: No prevertebral fluid or swelling. No  visible canal hematoma. Disc levels: Multilevel degenerative changes greatest at C4-5 with 2 mm retrolisthesis of C4 on C5. Uncovertebral degenerative changes and osteophyte formation at C4-5, C5-6 and C6-7. Mild-to-moderate facet arthropathy greatest at C3-4 on the RIGHT. Upper chest: Choose 1 Other: None IMPRESSION: 1. No acute intracranial abnormality. 2. Further evolution of a RIGHT frontal lobe infarct with volume loss and extensive white matter disease in the high high RIGHT posterior frontal lobe. Signs of prior LEFT occipital infarct as well. 3. Interval development of small areas of infarct along the high RIGHT central sulcus, these have a chronic appearance. 4. No evidence of acute fracture or subluxation of the cervical spine. 5. Multilevel degenerative changes of the cervical spine greatest at C4-5 with 2 mm retrolisthesis of C4 on C5. Electronically Signed   By: Zetta Bills M.D.   On: 04/24/2020 10:05   CT CERVICAL SPINE WO CONTRAST  Result Date: 04/24/2020 CLINICAL DATA:  Ground level fall, unknown time bruising bilateral eyes and LEFT hip pain EXAM: CT HEAD WITHOUT CONTRAST CT CERVICAL SPINE WITHOUT CONTRAST TECHNIQUE: Multidetector CT imaging of the head and cervical spine was performed following the standard protocol without intravenous contrast. Multiplanar CT image reconstructions of the cervical spine were also generated. COMPARISON:  CT head from August of 2020, MRI brain 07/26/2019 and CT head 10/19/2019 FINDINGS: CT HEAD FINDINGS Brain: Further evolution of a RIGHT frontal lobe infarct with volume loss and extensive white matter disease in the high high RIGHT posterior frontal lobe. Signs of prior LEFT occipital infarct as well. Extensive atrophy as before. No acute acute intracranial abnormality. No signs of hemorrhage, mass effect, midline shift or increase in ventricular dilation. Interval development of small areas of infarct since the prior study along the high RIGHT central  sulcus, these have a chronic appearance. Vascular: No hyperdense vessel or unexpected calcification. Skull: Normal. Negative for fracture or focal lesion. Sinuses/Orbits: No acute finding. Other: None. CT CERVICAL SPINE FINDINGS Alignment: Mild curvature of the spine and mild straightening of the spine may be due to position or spasm. Also could be related to degenerative changes. Skull base and vertebrae: No acute fracture. No primary bone lesion or focal pathologic process. Soft tissues and spinal canal: No prevertebral fluid or swelling. No visible canal hematoma. Disc levels: Multilevel degenerative changes greatest at C4-5 with 2 mm retrolisthesis of C4 on C5. Uncovertebral degenerative changes and osteophyte formation at C4-5, C5-6 and C6-7. Mild-to-moderate facet arthropathy greatest at C3-4 on the RIGHT. Upper chest: Choose 1 Other: None IMPRESSION: 1. No acute intracranial abnormality. 2. Further evolution of a RIGHT frontal lobe infarct with volume loss and extensive white matter disease in the high high RIGHT posterior frontal lobe. Signs of prior LEFT occipital infarct as well. 3. Interval development of small areas  of infarct along the high RIGHT central sulcus, these have a chronic appearance. 4. No evidence of acute fracture or subluxation of the cervical spine. 5. Multilevel degenerative changes of the cervical spine greatest at C4-5 with 2 mm retrolisthesis of C4 on C5. Electronically Signed   By: Zetta Bills M.D.   On: 04/24/2020 10:05   DG Knee Complete 4 Views Left  Result Date: 04/24/2020 CLINICAL DATA:  Pain following fall EXAM: LEFT KNEE - COMPLETE 4+ VIEW COMPARISON:  None. FINDINGS: Frontal, oblique, and lateral views obtained. Bones osteoporotic. No fracture or dislocation. No joint effusion. Moderate joint space narrowing medially. No erosive change. IMPRESSION: Moderate joint space narrowing medially. Osteoporosis. No fracture, dislocation, or evident joint effusion. Electronically  Signed   By: Lowella Grip III M.D.   On: 04/24/2020 09:37   DG Hip Unilat W or Wo Pelvis 2-3 Views Left  Result Date: 04/24/2020 CLINICAL DATA:  Pain following fall EXAM: DG HIP (WITH OR WITHOUT PELVIS) 2-3V LEFT COMPARISON:  None. FINDINGS: Frontal pelvis as well as frontal and lateral left hip images were obtained. There is a comminuted intertrochanteric femur fracture on the left with varus angulation at the fracture site. There is avulsion of a portion of the lesser trochanter on the left. No other fracture. No dislocation. There is moderate symmetric narrowing of each hip joint. There is diffuse underlying osteoporosis. There have been kyphoplasty procedures in the lower lumbar spine as well as bilateral sacroplasty procedures. IMPRESSION: Comminuted intertrochanteric femur fracture on the left with varus angulation at the fracture site and avulsion of a portion of the lesser trochanter on the left. Narrowing of each hip joint.  Bones diffusely osteoporotic. Previous sacroplasty and lower lumbar kyphoplasty procedures. Electronically Signed   By: Lowella Grip III M.D.   On: 04/24/2020 09:35        Scheduled Meds: . acetaminophen  500 mg Oral BID  . bisacodyl  10 mg Rectal Daily  . cholecalciferol  2,000 Units Oral Daily  . citalopram  20 mg Oral Daily  . lisinopril  5 mg Oral Daily  . memantine  10 mg Oral BID  . [START ON 04/26/2020] multivitamin with minerals  1 tablet Oral Daily  . polyethylene glycol  17 g Oral Daily  . pravastatin  20 mg Oral q1800  . QUEtiapine  25 mg Oral q morning - 10a  . QUEtiapine  50 mg Oral QHS  . traZODone  50 mg Oral QHS  . vitamin B-12  1,000 mcg Oral Daily   Continuous Infusions: .  ceFAZolin (ANCEF) IV Stopped (04/25/20 0514)  . methocarbamol (ROBAXIN) IV      Assessment & Plan:   Principal Problem:   Intertrochanteric fracture of left femur, closed, initial encounter [facility]) Active Problems:   Mixed Alzheimer's and vascular dementia  ([place])   Essential (primary) hypertension   Fall   Intertrochanteric fracture of left femur ([place])   Status post fall Ortho consulted.  Patient is status post fall with intertrochanteric fracture of left femur-s/p Repair today  fall precautions Pain control    Mixed Alzheimer's and vascular dementia Continue Citalopram, Seroquel, Trazodone and Memantine   Hypertension Awaiting for vitals to be done, earlier was stable. Continue Lisinopril    DVT prophylaxis: SCD Code Status: DNR Family Communication: none  Disposition Plan: Back to previous home environment vs rehab Barrier: having surgery today. Need PT/OT evalve when cleared by surgery     LOS: 1 day   Time spent: 45 minutes  with more than 50% on [place], MD [facility] Pager [phone]  If 7PM-7AM, please contact night-coverage [email or web address] Password [facility] 04/25/2020, 6:54 PM

## 2020-04-25 NOTE — Anesthesia Procedure Notes (Addendum)
Procedure Name: Intubation Date/Time: 04/25/2020 4:27 PM Performed by: Kelton Pillar, CRNA Pre-anesthesia Checklist: Patient identified, Patient being monitored, Timeout performed, Emergency Drugs available and Suction available Patient Re-evaluated:Patient Re-evaluated prior to induction Oxygen Delivery Method: Circle system utilized Preoxygenation: Pre-oxygenation with 100% oxygen Induction Type: IV induction Ventilation: Mask ventilation without difficulty Laryngoscope Size: 3 and McGraph Grade View: Grade I Tube type: Oral Tube size: 6.5 mm Number of attempts: 1 Airway Equipment and Method: Stylet Placement Confirmation: ETT inserted through vocal cords under direct vision,  positive ETCO2,  breath sounds checked- equal and bilateral and CO2 detector Secured at: 21 cm Tube secured with: Tape Dental Injury: Teeth and Oropharynx as per pre-operative assessment

## 2020-04-25 NOTE — ED Notes (Signed)
Pt positioned for comfort, cries when moved and guards left hip. Pt has hx of dementia and is unable to answer questions appropriately. Meds given per MD order.

## 2020-04-25 NOTE — ED Notes (Signed)
Assigned bed @ [id number], spoke with RN Janett Billow.

## 2020-04-25 NOTE — Anesthesia Preprocedure Evaluation (Addendum)
Anesthesia Evaluation  Patient identified by MRN, date of birth, ID band Patient awake    Reviewed: Allergy & Precautions, H&P , NPO status , Patient's Chart, lab work & pertinent test results  History of Anesthesia Complications Negative for: history of anesthetic complications  Airway Mallampati: III  TM Distance: >3 FB Neck ROM: full    Dental  (+) Chipped   Pulmonary neg pulmonary ROS, neg sleep apnea, neg COPD, Patient abstained from smoking.Not current smoker,    Pulmonary exam normal        Cardiovascular Exercise Tolerance: Good METShypertension, (-) CAD and (-) Past MI Normal cardiovascular exam(-) dysrhythmias      Neuro/Psych Seizures -,  PSYCHIATRIC DISORDERS Anxiety Depression Dementia CVA    GI/Hepatic Neg liver ROS, GERD  ,  Endo/Other  negative endocrine ROSneg diabetes  Renal/GU Renal disease (bilateral hydronephrosis)     Musculoskeletal   Abdominal   Peds  Hematology negative hematology ROS (+)   Anesthesia Other Findings Past Medical History: No date: Arthritis     Comment:  hands No date: Bladder cancer ([place]) 2019: Brain bleed ([place])     Comment:  d/t light strokes, high bp, seizures. brain has shrunk               per dr. Manuella Ghazi and mri No date: Cancer [facility])     Comment:  BLADDER X 2 2019: Dementia ([place])     Comment:  dementia progressing No date: Depression No date: GERD (gastroesophageal reflux disease)     Comment:  chokes easily even on own saliva and then has panic               attack No date: Hyperlipidemia 2019: Hypertension     Comment:  no medications.  no htn per daughter No date: Seizures [facility])     Comment:  family not aware of any seizures and no treatment 2019: Stroke [facility])     Comment:  undiagnosed but per neuro, she has had them causing               vascular dementia No date: Wears dentures     Comment:  partial upper and lower  Past Surgical History: No date:  ABDOMINAL HYSTERECTOMY; Bilateral     Comment:  BILATERAL s&o No date: BACK SURGERY 2015: BLADDER SURGERY; N/A     Comment:  REMOVAL OF BLADDER CANCER X 2 1999: BOWEL RESECTION; N/A     Comment:  SMALL BOWEL OBSTRUCTION No date: CHOLECYSTECTOMY 11/06/2015: COLONOSCOPY; N/A     Comment:  Procedure: COLONOSCOPY;  Surgeon: Hulen Luster, MD;                Location: [place];  Service:               Gastroenterology;  Laterality: N/A; 04/17/2015: CYSTOSCOPY WITH BIOPSY; N/A     Comment:  Procedure: CYSTOSCOPY WITH BIOPSY;  Surgeon: Hollice Espy, MD;  Location: [facility];  Service: Urology;                Laterality: N/A; 12/20/2017: KYPHOPLASTY; N/A     Comment:  Procedure: [id number];  Surgeon: Hessie Knows, MD;               Location: [facility];  Service: Orthopedics;  Laterality:               N/A; 04/21/2018: KYPHOPLASTY; N/A  Comment:  Procedure: [id number];  Surgeon: Hessie Knows, MD;  Location: [facility];  Service: Orthopedics;               Laterality: N/A; 11/03/2018: SACROPLASTY; N/A     Comment:  Procedure: SACROPLASTY-S1;  Surgeon: Hessie Knows, MD;               Location: [facility];  Service: Orthopedics;  Laterality:               N/A;  BMI    Body Mass Index: 16.63 kg/m      Reproductive/Obstetrics negative OB ROS                            Anesthesia Physical Anesthesia Plan  ASA: III  Anesthesia Plan: Spinal   Post-op Pain Management:    Induction: Intravenous  PONV Risk Score and Plan: 3 and Ondansetron, Dexamethasone, Propofol infusion and TIVA  Airway Management Planned: Natural Airway and Simple Face Mask  Additional Equipment: None  Intra-op Plan:   Post-operative Plan:   Informed Consent: I have reviewed the patients History and Physical, chart, labs and discussed the procedure including the risks, benefits and alternatives for the proposed anesthesia with the  patient or authorized representative who has indicated his/her understanding and acceptance.   Patient has DNR.  Discussed DNR with power of attorney and Continue DNR.   Dental Advisory Given  Plan Discussed with: CRNA and Surgeon  Anesthesia Plan Comments: (Discussed R/B/A of neuraxial anesthesia technique with patient's daughter [name] via telephone: - rare risks of spinal/epidural hematoma, nerve damage, infection - Risk of PDPH - Risk of nausea and vomiting - Risk of conversion to general anesthesia and its associated risks, including sore throat, damage to lips/teeth/oropharynx, and rare risks such as cardiac and respiratory events and prolonged intubation.  Had extensive discussion regarding patient's DNR. Daughter does not believe her mother would want chest compressions in any scenario. She feels she would be OK with intubation / prolonged intubation if necessary. Will continue with this modified DNR in periop period.  Daughter voiced understanding.)       Anesthesia Quick Evaluation

## 2020-04-25 NOTE — ED Notes (Signed)
Report received from [facility]. Patient care assumed. Patient/RN introduction complete. Will continue to monitor.

## 2020-04-25 NOTE — ED Notes (Signed)
Called report to 1C, per secretary the RN taking the patient [name]) did not have any questions about the patient and would call me if needed.

## 2020-04-25 NOTE — Op Note (Signed)
04/25/2020  5:35 PM  PATIENT:  Gabrielle Crosby  86 y.o. female  PRE-OPERATIVE DIAGNOSIS:  left hip fracture left intertrochanteric comminuted  POST-OPERATIVE DIAGNOSIS:  left hip fracture same  PROCEDURE:  Procedure(s): INTRAMEDULLARY (IM) NAIL INTERTROCHANTRIC (Left)  SURGEON: Laurene Footman, MD  ASSISTANTS: None  ANESTHESIA:   general  EBL:  No intake/output data recorded.  BLOOD ADMINISTERED:none  DRAINS: none   LOCAL MEDICATIONS USED:  NONE  SPECIMEN:  No Specimen  DISPOSITION OF SPECIMEN:  N/A  COUNTS:  YES  TOURNIQUET:  * No tourniquets in log *  IMPLANTS: Biomet affixes 9 x 340 mm left rod with 90 mm leg screw and 42 mm distal interlocking screw  DICTATION: .Dragon Dictation patient was brought to the operating room and after attempted spinal failed she was placed under general anesthesia.  She was placed on the fracture table with the right leg in the well-leg holder left foot in the traction boot with traction applied.  Even with traction there was a apex anterior deformity that had to be fixed with direct pressure to the anterior proximal femur which gave near anatomic alignment.  After checking both AP and lateral imaging the good visualization of the head and fracture could be obtained in both projections the hip was prepped and draped using a barrier drape method.  After patient identification and timeout procedures were completed incision was made proximal to the greater trochanter and a guidewire pin inserted into the greater trochanter checking position on AP and lateral imaging.  Proximal reaming was carried out followed by passage of a long guidewire with reaming to 11 mm.  Based on measurement off this guidewire a 340 mm 9 mm diameter rod was inserted down the canal to the appropriate depth.  With scrub holding the fracture in a reduced position, a threaded guidewire was inserted across the fracture site near center center position.  This was measured reamed  drill carried out and then the 90 mm leg screw was inserted following screw insertion traction was released and compression applied.  The insertion handle was removed after tightening the proximal set not for the leg screw and AP lateral images obtained.  With the leg in abduction the distal screw hole was identified and a single 42 mm screw was placed after drilling measuring and placing the 42 mm screw with permanent C-arm views obtained.  The wounds were irrigated and 0 Vicryl was used to close the deep fascia in the proximal incision with 2-0 Vicryl to reapproximate subcutaneous tissue followed by skin staples Xeroform and honeycomb dressings.  PLAN OF CARE: Continue as inpatient  PATIENT DISPOSITION:  PACU - hemodynamically stable.

## 2020-04-25 NOTE — TOC Progression Note (Signed)
Transition of Care [facility]) - Progression Note    Patient Details  Name: Gabrielle Crosby MRN: [id number] Date of Birth: 30-Aug-1933  Transition of Care [facility]) CM/SW Contact  Shelbie Hutching, RN Phone Number: 04/25/2020, 3:53 PM  Clinical Narrative:    Patient is on her way to the OR for repair of her left hip fracture. Patient's daughter, [name], reports that patient has significant dementia and lives at [facility] in [place].  [name] would prefer for patient to be able to go back there at discharge and have home health PT come out.  RNCM explained that PT will come and see the patient tomorrow after she has had her surgery and they will be able to evaluate if patient is safe to return with home health.  If patient needs skilled nursing rehab, daughter prefers for the patient to go to the closest one that accepts COVID patient's.   TOC team will follow up tomorrow after PT evaluation completed.     Expected Discharge Plan: [place] Barriers to Discharge: Continued Medical Work up  Expected Discharge Plan and Services Expected Discharge Plan: [place] arrangements for the past 2 months: [place]                                       Social Determinants of Health (SDOH) Interventions    Readmission Risk Interventions No flowsheet data found.

## 2020-04-25 NOTE — ED Notes (Signed)
Pt had small bowel movement in brief. PT brief removed and pt cleaned up with bath wipes

## 2020-04-25 NOTE — Progress Notes (Signed)
Initial Nutrition Assessment  RD working remotely.  DOCUMENTATION CODES:   Underweight  INTERVENTION:   -Once diet is advanced, add:   -Magic cup TID with meals, each supplement provides 290 kcal and 9 grams of protein -MVI with minerals daily  NUTRITION DIAGNOSIS:   Increased nutrient needs related to post-op healing as evidenced by estimated needs.  GOAL:   Patient will meet greater than or equal to 90% of their needs  MONITOR:   PO intake, Supplement acceptance, Diet advancement, Labs, Weight trends, Skin, I & O's  REASON FOR ASSESSMENT:   Consult Hip fracture protocol  ASSESSMENT:   Gabrielle Crosby is a 86 y.o. female with medical history significant for advanced dementia, hypertension, seizures and stroke who was brought into the emergency room by EMS after an unwitnessed fall in the skilled nursing facility resulting in left hip pain and bruising of the forehead and left eye.  Patient is unable to provide any history but complains of left hip pain with palpitation.  Time of fall and reason for fall is unknown since patient is a poor historian.  Pt admitted with intertochanteric lt femur fracture s/p fall.   Reviewed I/O's: -850 ml x 24 hours  Attempted to speak with pt via phone, however, no answer. Per chart review, pt with advanced dementia and unable to provide reliable history.   Per chart review, pt living at SNF PTA. Plan for ORIF today; pt currently NPO for procedure.   Reviewed wt hx; pt has experienced a 17.7% wt loss over the past 3 months, which is significant for time frame. Highly suspect malnutrition given underweight status, significant wt loss, and advanced dementia, however, RD unable to identify at this time.   Medications reviewed and include dulcolax, vitamin D, miralax, and vitamin B-12.   Labs reviewed.   Diet Order:   Diet Order            Diet NPO time specified  Diet effective midnight              EDUCATION NEEDS:   No  education needs have been identified at this time  Skin:  Skin Assessment: Reviewed RN Assessment  Last BM:  04/25/20  Height:   Ht Readings from Last 1 Encounters:  01/20/20 5\' 1"  (1.549 m)    Weight:   Wt Readings from Last 1 Encounters:  04/24/20 39.9 kg    Ideal Body Weight:  47.7 kg  BMI:  Body mass index is 16.63 kg/m.  Estimated Nutritional Needs:   Kcal:  1400-1600  Protein:  65-80 grams  Fluid:  > 1.4 L    Loistine Chance, RD, LDN, [place] Registered Dietitian II Certified Diabetes Care and Education Specialist Please refer to [facility] for RD and/or RD on-call/weekend/after hours pager

## 2020-04-25 NOTE — ED Notes (Signed)
Bladder scanned pt d/t not having urinated on previous nurse's shift. Showed 438mL. Have messaged MD.

## 2020-04-25 NOTE — ED Notes (Signed)
PT cleansed of incontinent stool and new brief and chuck applied. PT did not tolerate well.

## 2020-04-25 NOTE — Progress Notes (Signed)
Pt not alert enough to take medication. Will hold at this time and try again later.   Pt appeared to be shivering when I entered the room. Room temp was set @ 75degress. Pt wearing two bath blankets over herself. She was wearing a O2 face mask @7L , and had IVFs running without a pump. O2 mask removed since pt was sating @100 %. Pt stayed @98 % on RA. New IVFs hung on Alaris pump. Pt given a warm blanket and regular bed blanket. Shivering stopped. Pt gave sigh that sounded comforting to this nurse. Will continue to monitor pt.

## 2020-04-25 NOTE — Transfer of Care (Signed)
Immediate Anesthesia Transfer of Care Note  Patient: Gabrielle Crosby  Procedure(s) Performed: INTRAMEDULLARY (IM) NAIL INTERTROCHANTRIC (Left )  Patient Location: Nursing Unit  Anesthesia Type:General  Level of Consciousness: drowsy and confused  Airway & Oxygen Therapy: Patient Spontanous Breathing and Patient connected to face mask oxygen  Post-op Assessment: Report given to RN and Post -op Vital signs reviewed and stable  Post vital signs: Reviewed and stable  Last Vitals:  Vitals Value Taken Time  BP 143/89   Temp 36.7 C   Pulse 65 04/25/20 1811  Resp 12 04/25/20 1811  SpO2 100 % 04/25/20 1811  Vitals shown include unvalidated device data.  Last Pain:  Vitals:   04/25/20 0821  TempSrc: Axillary  PainSc:          Complications: No apparent anesthesia complications

## 2020-04-26 ENCOUNTER — Inpatient Hospital Stay: Payer: Medicare Other

## 2020-04-26 LAB — CBC
HCT: 24.9 % — ABNORMAL LOW (ref 36.0–46.0)
Hemoglobin: 7.8 g/dL — ABNORMAL LOW (ref 12.0–15.0)
MCH: 30.6 pg (ref 26.0–34.0)
MCHC: 31.3 g/dL (ref 30.0–36.0)
MCV: 97.6 fL (ref 80.0–100.0)
Platelets: 172 10*3/uL (ref 150–400)
RBC: 2.55 MIL/uL — ABNORMAL LOW (ref 3.87–5.11)
RDW: 16.9 % — ABNORMAL HIGH (ref 11.5–15.5)
WBC: 13.2 10*3/uL — ABNORMAL HIGH (ref 4.0–10.5)
nRBC: 0 % (ref 0.0–0.2)

## 2020-04-26 LAB — BASIC METABOLIC PANEL
Anion gap: 5 (ref 5–15)
BUN: 27 mg/dL — ABNORMAL HIGH (ref 8–23)
CO2: 28 mmol/L (ref 22–32)
Calcium: 8.4 mg/dL — ABNORMAL LOW (ref 8.9–10.3)
Chloride: 111 mmol/L (ref 98–111)
Creatinine, Ser: 0.58 mg/dL (ref 0.44–1.00)
GFR calc Af Amer: >60 mL/min (ref >60)
GFR calc non Af Amer: >60 mL/min (ref >60)
Glucose, Bld: 122 mg/dL — ABNORMAL HIGH (ref 70–99)
Potassium: 4.3 mmol/L (ref 3.5–5.1)
Sodium: 144 mmol/L (ref 135–145)

## 2020-04-26 LAB — LACTIC ACID, PLASMA
Lactic Acid, Venous: 1.1 mmol/L (ref 0.5–1.9)
Lactic Acid, Venous: 1.2 mmol/L (ref 0.5–1.9)

## 2020-04-26 MED ORDER — SODIUM CHLORIDE 0.9 % IV BOLUS
250.0000 mL | Freq: Once | INTRAVENOUS | Status: AC
  Administered 2020-04-26: 250 mL via INTRAVENOUS

## 2020-04-26 NOTE — Care Management Important Message (Signed)
Important Message  Patient Details  Name: Gabrielle Crosby MRN: [id number] Date of Birth: December 30, 1932   Medicare Important Message Given:  Yes  Initial Medicare IM given by Patient Access Associate on 04/25/2020 at 11:56am.     [name] 04/26/2020, 8:29 AM

## 2020-04-26 NOTE — Progress Notes (Signed)
   04/26/20 0947  Assess: MEWS Score  Temp (!) 94.4 F (34.7 C) (axillary)  BP 128/88  Pulse Rate (!) 109  ECG Heart Rate (!) 110  Resp (!) 24  Level of Consciousness Alert  SpO2 100 %  Assess: MEWS Score  MEWS Temp 2  MEWS Systolic 0  MEWS Pulse 1  MEWS RR 1  MEWS LOC 0  MEWS Score 4  MEWS Score Color Red  Assess: if the MEWS score is Yellow or Red  Were vital signs taken at a resting state? Yes  Focused Assessment Documented focused assessment  Early Detection of Sepsis Score *See Row Information* High  MEWS guidelines implemented *See Row Information* Yes  Treat  MEWS Interventions Administered prn meds/treatments;Escalated (See documentation below)  Take Vital Signs  Increase Vital Sign Frequency  Yellow: Q 2hr X 2 then Q 4hr X 2, if remains yellow, continue Q 4hrs  Escalate  MEWS: Escalate Red: discuss with charge nurse/RN and provider, consider discussing with RRT  Notify: Charge Nurse/RN  Name of Charge Nurse/RN Notified [place], RN  Date Charge Nurse/RN Notified 04/26/20  Time Charge Nurse/RN Notified [id number]  Notify: Provider  Provider Name/Title Amery  Date Provider Notified 04/26/20  Time Provider Notified 1000  Notification Type Page  Notification Reason Change in status  Response No new orders  Date of Provider Response 04/26/20  Time of Provider Response 1005  Notify: Rapid Response  Name of Rapid Response RN Notified Estill Bamberg, RN  Date Rapid Response Notified 04/26/20  Time Rapid Response Notified [id number]  Document  Patient Outcome Stabilized after interventions  Progress note created (see row info) Yes

## 2020-04-26 NOTE — Progress Notes (Signed)
   Subjective: 1 Day Post-Op Procedure(s) (LRB): INTRAMEDULLARY (IM) NAIL INTERTROCHANTRIC (Left) Patient sleeping. Unable to answer questions.   Objective: Vital signs in last 24 hours: Temp:  [94.4 F (34.7 C)-98.2 F (36.8 C)] 96.7 F (35.9 C) (05/28 1145) Pulse Rate:  [72-109] 86 (05/28 1145) Resp:  [12-27] 16 (05/28 1145) BP: (84-185)/(42-110) 125/50 (05/28 1148) SpO2:  [85 %-100 %] 98 % (05/28 1145)  Intake/Output from previous day: 05/27 0701 - 05/28 0700 In: 794.5 [I.V.:744.5; IV Piggyback:50] Out: 50 [Blood:50] Intake/Output this shift: No intake/output data recorded.  Recent Labs    04/24/20 0822 04/25/20 0519 04/26/20 0534  HGB 11.1* 9.8* 7.8*   Recent Labs    04/25/20 0519 04/26/20 0534  WBC 9.1 13.2*  RBC 3.14* 2.55*  HCT 29.9* 24.9*  PLT 209 172   Recent Labs    04/25/20 0519 04/26/20 0534  NA 141 144  K 3.7 4.3  CL 108 111  CO2 26 28  BUN 21 27*  CREATININE 0.49 0.58  GLUCOSE 110* 122*  CALCIUM 8.5* 8.4*   No results for input(s): LABPT, INR in the last 72 hours.  EXAM General - Patient is Confused Extremity - No cellulitis present Compartment soft Dressing - dressing C/D/I and no drainage Motor Function - unable to follow commands to perform motor function tests  Past Medical History:  Diagnosis Date  . Arthritis    hands  . Bladder cancer ([place])   . Brain bleed ([place]) 2019   d/t light strokes, high bp, seizures. brain has shrunk per dr. Manuella Ghazi and mri  . Cancer ([place])    BLADDER X 2  . Dementia ([place]) 2019   dementia progressing  . Depression   . GERD (gastroesophageal reflux disease)    chokes easily even on own saliva and then has panic attack  . Hyperlipidemia   . Hypertension 2019   no medications.  no htn per daughter  . Seizures [facility])    family not aware of any seizures and no treatment  . Stroke [facility]) 2019   undiagnosed but per neuro, she has had them causing vascular dementia  . Wears dentures    partial upper and  lower    Assessment/Plan:   1 Day Post-Op Procedure(s) (LRB): INTRAMEDULLARY (IM) NAIL INTERTROCHANTRIC (Left) Principal Problem:   Intertrochanteric fracture of left femur, closed, initial encounter [facility]) Active Problems:   Mixed Alzheimer's and vascular dementia ([place])   Essential (primary) hypertension   Fall   Intertrochanteric fracture of left femur ([place])  Estimated body mass index is 16.63 kg/m as calculated from the following:   Height as of 01/20/20: 5\' 1"  (1.549 m).   Weight as of this encounter: 39.9 kg. VSS Acute post op blood loss anemia with underlying chronic anemia- recheck Hgb in the am  DVT Prophylaxis - Lovenox, Foot Pumps and TED hose Weight-Bearing as tolerated to left leg   T. Rachelle Hora, PA-C [place] 04/26/2020, 12:38 PM

## 2020-04-26 NOTE — Plan of Care (Signed)
Spoke with patient's daughter and updated as able.  Discussed current treatments and rapid events from this morning.  Explained that, at this point patient too lethargic to take in oral meds or food, but will continue to progress as able.  Daughter continues to be baffled by mom's covid positive results (since all others at the assisted care have been vaccinated and also test negative), Therefore, she is requesting another covid test to confirm her positive status.  Dr. Ree Kida of this request.

## 2020-04-26 NOTE — Progress Notes (Signed)
   04/26/20 0940  Clinical Encounter Type  Visited With Health care provider  Visit Type Initial;Spiritual support  Referral From Nurse  Consult/Referral To Chaplain  Chaplain responded to page which turned out to be a RR. When chaplain arrived she observed staff putting on ppe and entering the room, she went to nurse's station to see if she was needed and she was not, therefore she left.

## 2020-04-26 NOTE — Progress Notes (Addendum)
Physical Therapy Evaluation Patient Details Name: Gabrielle Crosby MRN: [id number] DOB: [birth year] Today's Date: 04/26/2020   History of Present Illness  Per MD Note:Gabrielle Crosby is a 86 y.o. female with medical history significant for advanced dementia, hypertension, seizures and stroke who was brought into the emergency room by EMS after an unwitnessed fall in the skilled nursing facility resulting in left hip pain and bruising of the forehead and left eye.  Patient is unable to provide any history but complains of left hip pain with palpitation.  Time of fall and reason for fall is unknown since patient is a poor historian.  Clinical Impression  Patient is crying upon writer entering her room. Patient stops crying when spoken to about PT evaluation. She agrees to try to sit up in bed. She is not able to answer questions about PLOF; strength testing was unable to be completed due to agitated status of patient.  She needs max assist for supine <> sit at EOB, however O2 saturation decreased to 62 %; .nursing was immediately notified via call bell, then escalated to call out in the hallway for help. Patient was left with nurse at bed side with O2 to 10 L to raise O2 saturation, O2 saturation began to improve,then a rapid response was called and PT left the room. Patient will benefit from skilled PT to improve mobility.  Follow Up Recommendations SNF    Equipment Recommendations  Rolling walker with 5" wheels    Recommendations for Other Services       Precautions / Restrictions Precautions Precautions: None Restrictions Weight Bearing Restrictions: Yes LLE Weight Bearing: Weight bearing as tolerated      Mobility  Bed Mobility Overal bed mobility: Needs Assistance Bed Mobility: Supine to Sit;Sit to Supine     Supine to sit: Max assist Sit to supine: Max assist   General bed mobility comments: Patient unable to assist   Transfers Overall transfer level: (unable , O2  saturation decreased to 62 on room air)                  Ambulation/Gait                Stairs            Wheelchair Mobility    Modified Rankin (Stroke Patients Only)       Balance Overall balance assessment: Needs assistance Sitting-balance support: No upper extremity supported;Feet supported Sitting balance-Leahy Scale: Poor Sitting balance - Comments: pt unable to assist                                     Pertinent Vitals/Pain Pain Assessment: Faces Faces Pain Scale: Hurts whole lot Pain Location: (left hip) Pain Intervention(s): Limited activity within patient's tolerance;Monitored during session    Home Living Family/patient expects to be discharged to:: Assisted living               Home Equipment: Other (comment) Additional Comments: (unknown)    Prior Function Level of Independence: Independent with assistive device(s)         Comments: (unknown except she was walking when she fell)     Hand Dominance        Extremity/Trunk Assessment        Lower Extremity Assessment Lower Extremity Assessment: Difficult to assess due to impaired cognition       Communication   Communication: Other (comment)(Patient  is crying)  Cognition Arousal/Alertness: Awake/alert Behavior During Therapy: Restless;Agitated Overall Cognitive Status: No family/caregiver present to determine baseline cognitive functioning                                        General Comments      Exercises     Assessment/Plan    PT Assessment Patient needs continued PT services  PT Problem List Decreased strength;Decreased balance;Decreased cognition;Cardiopulmonary status limiting activity       PT Treatment Interventions Gait training;Functional mobility training;Therapeutic exercise;Balance training    PT Goals (Current goals can be found in the Care Plan section)  Acute Rehab PT Goals Patient Stated Goal: no  goals stated PT Goal Formulation: Patient unable to participate in goal setting Time For Goal Achievement: 05/10/20 Potential to Achieve Goals: Poor    Frequency 7X/week   Barriers to discharge        Co-evaluation               AM-PAC PT "6 Clicks" Mobility  Outcome Measure Help needed turning from your back to your side while in a flat bed without using bedrails?: Total Help needed moving from lying on your back to sitting on the side of a flat bed without using bedrails?: Total Help needed moving to and from a bed to a chair (including a wheelchair)?: Total Help needed standing up from a chair using your arms (e.g., wheelchair or bedside chair)?: Total Help needed to walk in hospital room?: Total Help needed climbing 3-5 steps with a railing? : Total 6 Click Score: 6    End of Session Equipment Utilized During Treatment: Oxygen Activity Tolerance: Treatment limited secondary to medical complications (Comment) Patient left: with nursing/sitter in room Nurse Communication: (nurse called a rapid response after putting on O2) PT Visit Diagnosis: Muscle weakness (generalized) (M62.81);Difficulty in walking, not elsewhere classified (R26.2);Pain Pain - Right/Left: Left Pain - part of body: Leg    Time: [id number] PT Time Calculation (min) (ACUTE ONLY): 25 min   Charges:   PT Evaluation $PT Eval Low Complexity: 1 Low PT Treatments $Therapeutic Activity: 8-22 mins          Alanson Puls, PT DPT 04/26/2020, 10:11 AM

## 2020-04-26 NOTE — Progress Notes (Signed)
During rounding I found this pt to be tearful and c/o of pain in L leg again. 1mg  Morphine PRN given.

## 2020-04-26 NOTE — Progress Notes (Signed)
Spoke with daughter [name] to update pt at 2130 pt has been tearful and crying at times difficult to understand due to her crying. Pt was able to tolerate eating ice cream, will continue to monitor

## 2020-04-26 NOTE — Significant Event (Signed)
Rapid Response Event Note  Overview:  Page: 09:38, Arrival: 09:38 (rapid response RN was already on 1C rounding)     Initial Focused Assessment: Rapid response RN arrived in patient's room with patient in bed yelling with patient's RN at bedside hooking up O2 splitter to allow for non-rebreather mask and HFNC simultaneous use. Patient has a history of dementia and came in for hip fracture which was surgically repaired last night. Per patient's RN physical therapy was in with patient and alerted staff of low O2 sats. Upon this RN's assessment patient was alert, crying, with O2 saturations reading mid 80s with flat and irregular waveform (not correlated with heart monitor), her hand with the O2 sat probe was shaking and curled into fist. Color pale but pink.  Interventions: Initially helped patient's RN hook up NRB and HFNC. While patient's RN got morphine for patient's pain (indicated by crying and screaming), this RN, RT, and Ok Edwards RN got another pulse ox probe site on patient's foot. New pulse ox site had excellent waveform that correlated with heart monitor and showed pulse ox of 100%. Titrated oxygen down until back no room air and oxygen saturations stayed 100-upper 90s. After morphine patient relaxed and calm. Vital signs on room air at 09:48, BP 123/51, map 72, HR 107 ST, RR 14 and nonlabored, o2 sats 100% on room air.  Dr. Kurtis Bushman arrived in patient's room at 09:50 and personally assessed patient and reviewed vital signs. Dr. Kurtis Bushman stated she would order a chest x-ray just to be sure no respiratory issues since patient COVID positive.  Plan of Care (if not transferred): Patient to remain on 1C for now. Patient's RN to recall rapid response if needed.  Event Summary:   at      at          [place], [facility]

## 2020-04-26 NOTE — Progress Notes (Signed)
PROGRESS NOTE    Gabrielle Crosby  [id number] DOB: 1933/05/18 DOA: 04/24/2020 PCP: Toni Arthurs, NP    Brief Narrative:  Gabrielle Crosby is a 86 y.o. female with medical history significant for advanced dementia, hypertension, seizures and stroke who was brought into the emergency room by EMS after an unwitnessed fall in the skilled nursing facility resulting in left hip pain and bruising of the forehead and left eye.  Patient is unable to provide any history but complains of left hip pain with palpitation.  Time of fall and reason for fall is unknown since patient is a poor historian. Patient's COVID 19 test is positive but per daughter she has completed her vaccination series   Consultants:   Orthopedics  Procedures:   Antimicrobials:   Cefazolin x1   Subjective: This am had desated per nsg this am, but as soon as she sat up 02 up in 90s.  Objective: Vitals:   04/25/20 1500 04/25/20 2000 04/25/20 2359 04/26/20 0342  BP:  (!) 121/95 136/82 127/78  Pulse: (!) 108 72 83   Resp: 16 12 17    Temp:   98.2 F (36.8 C)   TempSrc:   Axillary   SpO2: 99% 100% 98%   Weight:        Intake/Output Summary (Last 24 hours) at 04/26/2020 0804 Last data filed at 04/26/2020 0300 Gross per 24 hour  Intake 794.53 ml  Output 50 ml  Net 744.53 ml   Filed Weights   04/24/20 0818  Weight: 39.9 kg    Examination:  General exam: Lethargic, nad. Appears tired and dry Heent: DMM Respiratory system: Clear to auscultation. With poor respiratory effort normal. Cardiovascular system: S1 & S2 heard, RRR. No JVD, murmurs, rubs, gallops or clicks.  Gastrointestinal system: Abdomen is nondistended, soft and nontender.  Normal bowel sounds heard. Central nervous system: Unable to assess Extremities: No edema Skin: Warm and dry Psychiatry: Unable to assess    Data Reviewed: I have personally reviewed following labs and imaging studies  CBC: Recent Labs  Lab 04/24/20 0822  04/25/20 0519 04/26/20 0534  WBC 6.7 9.1 13.2*  NEUTROABS 4.9  --   --   HGB 11.1* 9.8* 7.8*  HCT 35.4* 29.9* 24.9*  MCV 96.2 95.2 97.6  PLT 269 209 [id number]   Basic Metabolic Panel: Recent Labs  Lab 04/24/20 0822 04/25/20 0519 04/26/20 0534  NA 142 141 144  K 3.9 3.7 4.3  CL 107 108 111  CO2 29 26 28   GLUCOSE 128* 110* 122*  BUN 15 21 27*  CREATININE 0.46 0.49 0.58  CALCIUM 8.5* 8.5* 8.4*   GFR: Estimated Creatinine Clearance: 33 mL/min (by C-G formula based on SCr of 0.58 mg/dL). Liver Function Tests: Recent Labs  Lab 04/24/20 0822  AST 50*  ALT 27  ALKPHOS 120  BILITOT 0.8  PROT 7.0  ALBUMIN 3.3*   No results for input(s): LIPASE, AMYLASE in the last 168 hours. No results for input(s): AMMONIA in the last 168 hours. Coagulation Profile: No results for input(s): INR, PROTIME in the last 168 hours. Cardiac Enzymes: No results for input(s): CKTOTAL, CKMB, CKMBINDEX, TROPONINI in the last 168 hours. BNP (last 3 results) No results for input(s): PROBNP in the last 8760 hours. HbA1C: No results for input(s): HGBA1C in the last 72 hours. CBG: No results for input(s): GLUCAP in the last 168 hours. Lipid Profile: No results for input(s): CHOL, HDL, LDLCALC, TRIG, CHOLHDL, LDLDIRECT in the last 72 hours. Thyroid Function Tests:  No results for input(s): TSH, T4TOTAL, FREET4, T3FREE, THYROIDAB in the last 72 hours. Anemia Panel: No results for input(s): VITAMINB12, FOLATE, FERRITIN, TIBC, IRON, RETICCTPCT in the last 72 hours. Sepsis Labs: No results for input(s): PROCALCITON, LATICACIDVEN in the last 168 hours.  Recent Results (from the past 240 hour(s))  SARS Coronavirus 2 by RT PCR (hospital order, performed in [facility] hospital lab) Nasopharyngeal Urine, Catheterized     Status: Abnormal   Collection Time: 04/24/20  8:44 AM   Specimen: Urine, Catheterized; Nasopharyngeal  Result Value Ref Range Status   SARS Coronavirus 2 POSITIVE (A) NEGATIVE Final     Comment: CRITICAL RESULT CALLED TO, READ BACK BY AND VERIFIED WITH: ALLY YOW AT [id number] ON 04/24/2020 [place]. (NOTE) SARS-CoV-2 target nucleic acids are DETECTED SARS-CoV-2 RNA is generally detectable in upper respiratory specimens  during the acute phase of infection.  Positive results are indicative  of the presence of the identified virus, but do not rule out bacterial infection or co-infection with other pathogens not detected by the test.  Clinical correlation with patient history and  other diagnostic information is necessary to determine patient infection status.  The expected result is negative. Fact Sheet for Patients:   StrictlyIdeas.no  Fact Sheet for Healthcare Providers:   BankingDealers.co.za   This test is not yet approved or cleared by the [place] FDA and  has been authorized for detection and/or diagnosis of SARS-CoV-2 by FDA under an Emergency Use Authorization (EUA).  This EUA will remain in effect (meaning this t est can be used) for the duration of  the COVID-19 declaration under Section 564(b)(1) of the Act, 21 U.S.C. section 360-bbb-3(b)(1), unless the authorization is terminated or revoked sooner. Performed at [facility], [address] [place], [place] [address]   MRSA PCR Screening     Status: None   Collection Time: 04/25/20 10:48 AM   Specimen: Nasopharyngeal  Result Value Ref Range Status   MRSA by PCR NEGATIVE NEGATIVE Final    Comment:        The GeneXpert MRSA Assay (FDA approved for NASAL specimens only), is one component of a comprehensive MRSA colonization surveillance program. It is not intended to diagnose MRSA infection nor to guide or monitor treatment for MRSA infections. Performed at [facility], [address] [place], [place] [address]          Radiology Studies: DG Chest 1 View  Result Date: 04/24/2020 CLINICAL DATA:  Pain following fall EXAM: CHEST  1  VIEW COMPARISON:  October 21, 2019 FINDINGS: Lungs are clear. The heart size and pulmonary vascularity are normal. No adenopathy. Bones are osteoporotic. There is aortic atherosclerosis. No pneumothorax; skin fold noted on the right. Previous kyphoplasty procedures throughout the lumbar spine region noted. IMPRESSION: Lungs clear. Cardiac silhouette normal. Aortic Atherosclerosis (ICD10-I70.0). Electronically Signed   By: Lowella Grip III M.D.   On: 04/24/2020 09:36   CT HEAD WO CONTRAST  Result Date: 04/24/2020 CLINICAL DATA:  Ground level fall, unknown time bruising bilateral eyes and LEFT hip pain EXAM: CT HEAD WITHOUT CONTRAST CT CERVICAL SPINE WITHOUT CONTRAST TECHNIQUE: Multidetector CT imaging of the head and cervical spine was performed following the standard protocol without intravenous contrast. Multiplanar CT image reconstructions of the cervical spine were also generated. COMPARISON:  CT head from August of 2020, MRI brain 07/26/2019 and CT head 10/19/2019 FINDINGS: CT HEAD FINDINGS Brain: Further evolution of a RIGHT frontal lobe infarct with volume loss and extensive white matter  disease in the high high RIGHT posterior frontal lobe. Signs of prior LEFT occipital infarct as well. Extensive atrophy as before. No acute acute intracranial abnormality. No signs of hemorrhage, mass effect, midline shift or increase in ventricular dilation. Interval development of small areas of infarct since the prior study along the high RIGHT central sulcus, these have a chronic appearance. Vascular: No hyperdense vessel or unexpected calcification. Skull: Normal. Negative for fracture or focal lesion. Sinuses/Orbits: No acute finding. Other: None. CT CERVICAL SPINE FINDINGS Alignment: Mild curvature of the spine and mild straightening of the spine may be due to position or spasm. Also could be related to degenerative changes. Skull base and vertebrae: No acute fracture. No primary bone lesion or focal  pathologic process. Soft tissues and spinal canal: No prevertebral fluid or swelling. No visible canal hematoma. Disc levels: Multilevel degenerative changes greatest at C4-5 with 2 mm retrolisthesis of C4 on C5. Uncovertebral degenerative changes and osteophyte formation at C4-5, C5-6 and C6-7. Mild-to-moderate facet arthropathy greatest at C3-4 on the RIGHT. Upper chest: Choose 1 Other: None IMPRESSION: 1. No acute intracranial abnormality. 2. Further evolution of a RIGHT frontal lobe infarct with volume loss and extensive white matter disease in the high high RIGHT posterior frontal lobe. Signs of prior LEFT occipital infarct as well. 3. Interval development of small areas of infarct along the high RIGHT central sulcus, these have a chronic appearance. 4. No evidence of acute fracture or subluxation of the cervical spine. 5. Multilevel degenerative changes of the cervical spine greatest at C4-5 with 2 mm retrolisthesis of C4 on C5. Electronically Signed   By: Zetta Bills M.D.   On: 04/24/2020 10:05   CT CERVICAL SPINE WO CONTRAST  Result Date: 04/24/2020 CLINICAL DATA:  Ground level fall, unknown time bruising bilateral eyes and LEFT hip pain EXAM: CT HEAD WITHOUT CONTRAST CT CERVICAL SPINE WITHOUT CONTRAST TECHNIQUE: Multidetector CT imaging of the head and cervical spine was performed following the standard protocol without intravenous contrast. Multiplanar CT image reconstructions of the cervical spine were also generated. COMPARISON:  CT head from August of 2020, MRI brain 07/26/2019 and CT head 10/19/2019 FINDINGS: CT HEAD FINDINGS Brain: Further evolution of a RIGHT frontal lobe infarct with volume loss and extensive white matter disease in the high high RIGHT posterior frontal lobe. Signs of prior LEFT occipital infarct as well. Extensive atrophy as before. No acute acute intracranial abnormality. No signs of hemorrhage, mass effect, midline shift or increase in ventricular dilation. Interval  development of small areas of infarct since the prior study along the high RIGHT central sulcus, these have a chronic appearance. Vascular: No hyperdense vessel or unexpected calcification. Skull: Normal. Negative for fracture or focal lesion. Sinuses/Orbits: No acute finding. Other: None. CT CERVICAL SPINE FINDINGS Alignment: Mild curvature of the spine and mild straightening of the spine may be due to position or spasm. Also could be related to degenerative changes. Skull base and vertebrae: No acute fracture. No primary bone lesion or focal pathologic process. Soft tissues and spinal canal: No prevertebral fluid or swelling. No visible canal hematoma. Disc levels: Multilevel degenerative changes greatest at C4-5 with 2 mm retrolisthesis of C4 on C5. Uncovertebral degenerative changes and osteophyte formation at C4-5, C5-6 and C6-7. Mild-to-moderate facet arthropathy greatest at C3-4 on the RIGHT. Upper chest: Choose 1 Other: None IMPRESSION: 1. No acute intracranial abnormality. 2. Further evolution of a RIGHT frontal lobe infarct with volume loss and extensive white matter disease in the high high RIGHT  posterior frontal lobe. Signs of prior LEFT occipital infarct as well. 3. Interval development of small areas of infarct along the high RIGHT central sulcus, these have a chronic appearance. 4. No evidence of acute fracture or subluxation of the cervical spine. 5. Multilevel degenerative changes of the cervical spine greatest at C4-5 with 2 mm retrolisthesis of C4 on C5. Electronically Signed   By: Zetta Bills M.D.   On: 04/24/2020 10:05   DG Knee Complete 4 Views Left  Result Date: 04/24/2020 CLINICAL DATA:  Pain following fall EXAM: LEFT KNEE - COMPLETE 4+ VIEW COMPARISON:  None. FINDINGS: Frontal, oblique, and lateral views obtained. Bones osteoporotic. No fracture or dislocation. No joint effusion. Moderate joint space narrowing medially. No erosive change. IMPRESSION: Moderate joint space narrowing  medially. Osteoporosis. No fracture, dislocation, or evident joint effusion. Electronically Signed   By: Lowella Grip III M.D.   On: 04/24/2020 09:37   DG HIP OPERATIVE UNILAT W OR W/O PELVIS LEFT  Result Date: 04/25/2020 CLINICAL DATA:  Left hip intramedullary nail EXAM: OPERATIVE LEFT HIP (WITH PELVIS IF PERFORMED) 2 VIEWS TECHNIQUE: Fluoroscopic spot image(s) were submitted for interpretation post-operatively. COMPARISON:  04/24/2020 FINDINGS: 4 fluoroscopic images are obtained during the performance of the procedure and are provided for interpretation only. Intramedullary rod with proximal dynamic and distal interlocking screws identified, with near anatomic alignment. FLUOROSCOPY TIME:  1 minutes 19 seconds IMPRESSION: 1. ORIF of an intertrochanteric left hip fracture as above. Electronically Signed   By: Randa Ngo M.D.   On: 04/25/2020 19:43   DG Hip Unilat W or Wo Pelvis 2-3 Views Left  Result Date: 04/24/2020 CLINICAL DATA:  Pain following fall EXAM: DG HIP (WITH OR WITHOUT PELVIS) 2-3V LEFT COMPARISON:  None. FINDINGS: Frontal pelvis as well as frontal and lateral left hip images were obtained. There is a comminuted intertrochanteric femur fracture on the left with varus angulation at the fracture site. There is avulsion of a portion of the lesser trochanter on the left. No other fracture. No dislocation. There is moderate symmetric narrowing of each hip joint. There is diffuse underlying osteoporosis. There have been kyphoplasty procedures in the lower lumbar spine as well as bilateral sacroplasty procedures. IMPRESSION: Comminuted intertrochanteric femur fracture on the left with varus angulation at the fracture site and avulsion of a portion of the lesser trochanter on the left. Narrowing of each hip joint.  Bones diffusely osteoporotic. Previous sacroplasty and lower lumbar kyphoplasty procedures. Electronically Signed   By: Lowella Grip III M.D.   On: 04/24/2020 09:35         Scheduled Meds: . acetaminophen  500 mg Oral BID  . aspirin EC  325 mg Oral Daily  . cholecalciferol  2,000 Units Oral Daily  . citalopram  20 mg Oral Daily  . clonazePAM  0.5 mg Oral BID  . docusate sodium  100 mg Oral BID  . enoxaparin (LOVENOX) injection  30 mg Subcutaneous Q24H  . fluticasone  1 spray Each Nare Daily  . lisinopril  5 mg Oral Daily  . melatonin  5 mg Oral QHS  . memantine  10 mg Oral BID  . multivitamin with minerals  1 tablet Oral Daily  . polyethylene glycol  17 g Oral Daily  . pravastatin  20 mg Oral q1800  . QUEtiapine  25 mg Oral q morning - 10a  . QUEtiapine  50 mg Oral QHS  . traMADol  50 mg Oral Q6H  . traZODone  50 mg Oral QHS  .  vitamin B-12  1,000 mcg Oral Daily   Continuous Infusions: . sodium chloride 75 mL/hr at 04/26/20 0300  .  ceFAZolin (ANCEF) IV 1 g (04/26/20 0536)  . methocarbamol (ROBAXIN) IV      Assessment & Plan:   Principal Problem:   Intertrochanteric fracture of left femur, closed, initial encounter [facility]) Active Problems:   Mixed Alzheimer's and vascular dementia ([place])   Essential (primary) hypertension   Fall   Intertrochanteric fracture of left femur ([place])   Status post fall Ortho consulted.  status post fall with intertrochanteric fracture of left femur-s/p Repair   fall precautions Pain control  Acute drop h/h- 2/2 surgery. Monitor., ck am labs. Transfuse if hg <7  Mixed Alzheimer's and vascular dementia Continue Citalopram, Seroquel, Trazodone and Memantine   Hypertension BP on low side.  Likely due to acute blood loss from surgery.  Also decreased p.o. intake. DC lisinopril for now Give bolus of IV fluid to 250 and start ivf for gentle hydration  COvid + test- no respiratory sx.  cxr negatrive. On RA. Will monitor   DVT prophylaxis: SCD Code Status: DNR Family Communication: none  Disposition Plan: needs snf Barrier: needs snf     LOS: 2 days   Time spent: 45 minutes with  more than 50% on [place], MD [facility] Pager [phone]  If 7PM-7AM, please contact night-coverage [email or web address] Password [facility] 04/26/2020, 8:04 AM Patient ID: Gabrielle Crosby, female   DOB: [birth year], 86 y.o.   MRN: [id number]

## 2020-04-26 NOTE — Progress Notes (Signed)
This nurse noticed pt HR in the 110's on the monitor and went to check on pt. Pt was tearful and c/o pain. Pt unable to express type or rate of pain, but per PAINAD scale pt was in severe pain. Pt refusing all PO meds at this time. 1mg  Morphine PRN given.

## 2020-04-26 NOTE — Progress Notes (Signed)
Dr Kurtis Bushman made aware of concern that pt may be septic per MEWS, new order for lactic acid placed

## 2020-04-26 NOTE — Significant Event (Signed)
Rapid Response Follow Up: Spoke with Rodman Key RN about patient's condition after reviewing chart. Patient currently stable from respiratory standpoint and satting well on room air. However, patient's blood pressure has on the lower side and patient has received a bolus for that. Currently Rodman Key RN has no needs from rapid response, but will recall if needed.

## 2020-04-27 ENCOUNTER — Inpatient Hospital Stay: Payer: Medicare Other

## 2020-04-27 ENCOUNTER — Encounter: Payer: Self-pay | Admitting: Internal Medicine

## 2020-04-27 LAB — CBC
HCT: 23.6 % — ABNORMAL LOW (ref 36.0–46.0)
Hemoglobin: 7.3 g/dL — ABNORMAL LOW (ref 12.0–15.0)
MCH: 31.1 pg (ref 26.0–34.0)
MCHC: 30.9 g/dL (ref 30.0–36.0)
MCV: 100.4 fL — ABNORMAL HIGH (ref 80.0–100.0)
Platelets: 198 10*3/uL (ref 150–400)
RBC: 2.35 MIL/uL — ABNORMAL LOW (ref 3.87–5.11)
RDW: 16.7 % — ABNORMAL HIGH (ref 11.5–15.5)
WBC: 9.3 10*3/uL (ref 4.0–10.5)
nRBC: 0 % (ref 0.0–0.2)

## 2020-04-27 MED ORDER — ENSURE ENLIVE PO LIQD
237.0000 mL | Freq: Two times a day (BID) | ORAL | Status: DC
  Administered 2020-04-28: 237 mL via ORAL

## 2020-04-27 MED ORDER — IOHEXOL 350 MG/ML SOLN
75.0000 mL | Freq: Once | INTRAVENOUS | Status: AC | PRN
  Administered 2020-04-27: 17:00:00 75 mL via INTRAVENOUS

## 2020-04-27 NOTE — Progress Notes (Signed)
   04/27/20 1820  Family/Significant Other Communication  Family/Significant Other Update Called;Updated (daughter donna )

## 2020-04-27 NOTE — Progress Notes (Signed)
PROGRESS NOTE    Gabrielle Crosby  [id number] DOB: 04/28/1933 DOA: 04/24/2020 PCP: Toni Arthurs, NP    Brief Narrative:  Gabrielle Crosby is a 86 y.o. female with medical history significant for advanced dementia, hypertension, seizures and stroke who was brought into the emergency room by EMS after an unwitnessed fall in the skilled nursing facility resulting in left hip pain and bruising of the forehead and left eye.  Patient is unable to provide any history but complains of left hip pain with palpitation.  Time of fall and reason for fall is unknown since patient is a poor historian. Patient's COVID 19 test is positive but per daughter she has completed her vaccination series   Consultants:   Orthopedics  Procedures:   Antimicrobials: Left  Cefazolin x1   Subjective: Pt maoning when I ask her questions.  I cant understand  Her.  Please let 0.7 care  Objective: Vitals:   04/26/20 1400 04/26/20 1600 04/26/20 1735 04/27/20 0021  BP: 130/90 (!) 114/46 (!) 116/50 91/73  Pulse:  99  95  Resp:  16  16  Temp:  97.7 F (36.5 C) 98 F (36.7 C) 97.6 F (36.4 C)  TempSrc:  Axillary  Axillary  SpO2:  98%    Weight:        Intake/Output Summary (Last 24 hours) at 04/27/2020 1319 Last data filed at 04/27/2020 1012 Gross per 24 hour  Intake 984.77 ml  Output 50 ml  Net 934.77 ml   Filed Weights   04/24/20 0818  Weight: 39.9 kg    Examination:  General exam: eyes, closed, moaning when I ask her questions, but I cant understand her.pale. Heent: DMM Respiratory system: Clear to auscultation. With poor respiratory effort normal. Cardiovascular system: S1 & S2 heard, RRR. No JVD, murmurs, rubs, gallops or clicks.  Gastrointestinal system: Abdomen is nondistended, soft and nontender.  Normal bowel sounds heard. Central nervous system: Unable to assess Extremities: No edema Skin: Warm and dry Psychiatry: Unable to assess    Data Reviewed: I have personally  reviewed following labs and imaging studies  CBC: Recent Labs  Lab 04/24/20 0822 04/25/20 0519 04/26/20 0534 04/27/20 0554  WBC 6.7 9.1 13.2* 9.3  NEUTROABS 4.9  --   --   --   HGB 11.1* 9.8* 7.8* 7.3*  HCT 35.4* 29.9* 24.9* 23.6*  MCV 96.2 95.2 97.6 100.4*  PLT 269 209 172 [id number]   Basic Metabolic Panel: Recent Labs  Lab 04/24/20 0822 04/25/20 0519 04/26/20 0534  NA 142 141 144  K 3.9 3.7 4.3  CL 107 108 111  CO2 29 26 28   GLUCOSE 128* 110* 122*  BUN 15 21 27*  CREATININE 0.46 0.49 0.58  CALCIUM 8.5* 8.5* 8.4*   GFR: Estimated Creatinine Clearance: 33 mL/min (by C-G formula based on SCr of 0.58 mg/dL). Liver Function Tests: Recent Labs  Lab 04/24/20 0822  AST 50*  ALT 27  ALKPHOS 120  BILITOT 0.8  PROT 7.0  ALBUMIN 3.3*   No results for input(s): LIPASE, AMYLASE in the last 168 hours. No results for input(s): AMMONIA in the last 168 hours. Coagulation Profile: No results for input(s): INR, PROTIME in the last 168 hours. Cardiac Enzymes: No results for input(s): CKTOTAL, CKMB, CKMBINDEX, TROPONINI in the last 168 hours. BNP (last 3 results) No results for input(s): PROBNP in the last 8760 hours. HbA1C: No results for input(s): HGBA1C in the last 72 hours. CBG: No results for input(s): GLUCAP in the last  168 hours. Lipid Profile: No results for input(s): CHOL, HDL, LDLCALC, TRIG, CHOLHDL, LDLDIRECT in the last 72 hours. Thyroid Function Tests: No results for input(s): TSH, T4TOTAL, FREET4, T3FREE, THYROIDAB in the last 72 hours. Anemia Panel: No results for input(s): VITAMINB12, FOLATE, FERRITIN, TIBC, IRON, RETICCTPCT in the last 72 hours. Sepsis Labs: Recent Labs  Lab 04/26/20 1556 04/26/20 1826  LATICACIDVEN 1.1 1.2    Recent Results (from the past 240 hour(s))  SARS Coronavirus 2 by RT PCR (hospital order, performed in [facility] hospital lab) Nasopharyngeal Urine, Catheterized     Status: Abnormal   Collection Time: 04/24/20  8:44 AM    Specimen: Urine, Catheterized; Nasopharyngeal  Result Value Ref Range Status   SARS Coronavirus 2 POSITIVE (A) NEGATIVE Final    Comment: CRITICAL RESULT CALLED TO, READ BACK BY AND VERIFIED WITH: ALLY YOW AT [id number] ON 04/24/2020 [place]. (NOTE) SARS-CoV-2 target nucleic acids are DETECTED SARS-CoV-2 RNA is generally detectable in upper respiratory specimens  during the acute phase of infection.  Positive results are indicative  of the presence of the identified virus, but do not rule out bacterial infection or co-infection with other pathogens not detected by the test.  Clinical correlation with patient history and  other diagnostic information is necessary to determine patient infection status.  The expected result is negative. Fact Sheet for Patients:   StrictlyIdeas.no  Fact Sheet for Healthcare Providers:   BankingDealers.co.za   This test is not yet approved or cleared by the [place] FDA and  has been authorized for detection and/or diagnosis of SARS-CoV-2 by FDA under an Emergency Use Authorization (EUA).  This EUA will remain in effect (meaning this t est can be used) for the duration of  the COVID-19 declaration under Section 564(b)(1) of the Act, 21 U.S.C. section 360-bbb-3(b)(1), unless the authorization is terminated or revoked sooner. Performed at [facility], [address] [place], [place] [address]   MRSA PCR Screening     Status: None   Collection Time: 04/25/20 10:48 AM   Specimen: Nasopharyngeal  Result Value Ref Range Status   MRSA by PCR NEGATIVE NEGATIVE Final    Comment:        The GeneXpert MRSA Assay (FDA approved for NASAL specimens only), is one component of a comprehensive MRSA colonization surveillance program. It is not intended to diagnose MRSA infection nor to guide or monitor treatment for MRSA infections. Performed at [facility], [address] [place], [place]  [address]          Radiology Studies: [facility] Chest [place] 1 View  Result Date: 04/26/2020 CLINICAL DATA:  Unwitnessed fall. EXAM: PORTABLE CHEST 1 VIEW COMPARISON:  Chest x-ray 04/24/2020. FINDINGS: Patient is rotated to the left. Mediastinum hilar structures normal. No acute pulmonary abnormality identified. Stable mild left base pleural thickening most likely scarring. No pneumothorax. Degenerative change thoracolumbar spine. Prior lumbar vertebroplasties. Old-appearing bilateral posterior-lateral eighth rib fractures. No acute abnormality identified. IMPRESSION: No acute abnormality identified. Electronically Signed   By: Marcello Moores  Register   On: 04/26/2020 12:09   DG HIP OPERATIVE UNILAT W OR W/O PELVIS LEFT  Result Date: 04/25/2020 CLINICAL DATA:  Left hip intramedullary nail EXAM: OPERATIVE LEFT HIP (WITH PELVIS IF PERFORMED) 2 VIEWS TECHNIQUE: Fluoroscopic spot image(s) were submitted for interpretation post-operatively. COMPARISON:  04/24/2020 FINDINGS: 4 fluoroscopic images are obtained during the performance of the procedure and are provided for interpretation only. Intramedullary rod with proximal dynamic and distal interlocking screws identified, with near anatomic  alignment. FLUOROSCOPY TIME:  1 minutes 19 seconds IMPRESSION: 1. ORIF of an intertrochanteric left hip fracture as above. Electronically Signed   By: Randa Ngo M.D.   On: 04/25/2020 19:43        Scheduled Meds: . acetaminophen  500 mg Oral BID  . aspirin EC  325 mg Oral Daily  . cholecalciferol  2,000 Units Oral Daily  . citalopram  20 mg Oral Daily  . clonazePAM  0.5 mg Oral BID  . docusate sodium  100 mg Oral BID  . enoxaparin (LOVENOX) injection  30 mg Subcutaneous Q24H  . fluticasone  1 spray Each Nare Daily  . melatonin  5 mg Oral QHS  . memantine  10 mg Oral BID  . multivitamin with minerals  1 tablet Oral Daily  . polyethylene glycol  17 g Oral Daily  . pravastatin  20 mg Oral q1800  . QUEtiapine  25 mg  Oral q morning - 10a  . QUEtiapine  50 mg Oral QHS  . traMADol  50 mg Oral Q6H  . traZODone  50 mg Oral QHS  . vitamin B-12  1,000 mcg Oral Daily   Continuous Infusions: . sodium chloride 75 mL/hr at 04/27/20 0556  . methocarbamol (ROBAXIN) IV      Assessment & Plan:   Principal Problem:   Intertrochanteric fracture of left femur, closed, initial encounter [facility]) Active Problems:   Mixed Alzheimer's and vascular dementia ([place])   Essential (primary) hypertension   Fall   Intertrochanteric fracture of left femur ([place])   Status post fall Ortho consulted.  status post fall with intertrochanteric fracture of left femur-s/p Repair   fall precautions Pain control  Acute drop h/h- 2/2 surgery. Monitor., ck am labs. Will transfuse a unit prbc with hg 7.3 and hypotensive.  Mixed Alzheimer's and vascular dementia Continue Citalopram, Seroquel, Trazodone and Memantine   Hypertension BP on low side.  Likely due to acute blood loss from surgery.  Also decreased p.o. intake. DC lisinopril for now Decrease ivf to 49m/hr since giving transfusion  COvid + test- no respiratory sx.  cxr negatrive. But on HF 02 sats in 80's per record, will obtain ct chest.  DVT prophylaxis: SCD Code Status: DNR Family Communication: Tried calling daughter, went to VM Disposition Plan: needs snf Barrier: needs snf placement, currently on HF 02, need to wean off. Needs transfusion.     LOS: 3 days   Time spent: 45 minutes with more than 50% on [place], MD [facility] Pager [phone]  If 7PM-7AM, please contact night-coverage [email or web address] Password [email or web address] 04/27/2020, 1:19 PM Patient ID: KELEE DEES, female   DOB: 1933/05/26, 86 y.o.   MRN: [id number]

## 2020-04-27 NOTE — Progress Notes (Signed)
Pt is tearful denies pain when asked gave tramadol per md order crushed in vanilla ice cream pt tolerated well. Has had 3 ice cream cups this shift.

## 2020-04-27 NOTE — Progress Notes (Signed)
Physical Therapy Treatment Patient Details Name: Gabrielle Crosby MRN: [id number] DOB: 1933-07-02 Today's Date: 04/27/2020    History of Present Illness Per MD Note:Gabrielle Crosby is a 86 y.o. female with medical history significant for advanced dementia, hypertension, seizures and stroke who was brought into the emergency room by EMS after an unwitnessed fall in the skilled nursing facility resulting in left hip pain and bruising of the forehead and left eye.  Patient is unable to provide any history but complains of left hip pain with palpitation.  Time of fall and reason for fall is unknown since patient is a poor historian.    PT Comments    Pt was long sitting asleep in bed upon arriving. She easily awakes and is oriented to self only. Confused throughout session and was unable to tolerate OOB activity. Pt crys out and groans with most movement of LE. Tearful even without movements. Attempted to sit EOB but pt unable to tolerate. Performed assisted/passive exercises in bed within pt tolerance. RN aware of pt's abilities and lack of progress. At conclusion of session, pt is long sitting in bed with bed alarm in place and call bell within reach.     Follow Up Recommendations  SNF     Equipment Recommendations  Other (comment)(defer to next level of care)    Recommendations for Other Services       Precautions / Restrictions Precautions Precautions: None Restrictions Weight Bearing Restrictions: Yes    Mobility  Bed Mobility Overal bed mobility: Needs Assistance Bed Mobility: Supine to Sit     Supine to sit: Max assist Sit to supine: Max assist   General bed mobility comments: pt was able to participate minimally but 2/2 to pain was unable to achieve full upright EOB. pt continuously yelling out in pain with progression to short sit. quickly return to long sitting and performed minimal ther ex.  Transfers                 General transfer comment: unable to  safely attempt  Ambulation/Gait                 Stairs             Wheelchair Mobility    Modified Rankin (Stroke Patients Only)       Balance                                            Cognition Arousal/Alertness: Awake/alert Behavior During Therapy: Restless;Agitated Overall Cognitive Status: History of cognitive impairments - at baseline                                 General Comments: Pt is very confused and disoriented. able to follow one step commands inconsistently. anxious with all movements and tearful throughout.      Exercises Total Joint Exercises Ankle Circles/Pumps: AAROM Heel Slides: PROM Hip ABduction/ADduction: AAROM;PROM Straight Leg Raises: AAROM;5 reps    General Comments        Pertinent Vitals/Pain Pain Assessment: (unable to rate) Pain Score: 10-Worst pain ever Pain Intervention(s): Limited activity within patient's tolerance;Monitored during session;Premedicated before session;Repositioned;Other (comment)(pt tearful and yells out in pain with movements)    Home Living  Prior Function            PT Goals (current goals can now be found in the care plan section) Acute Rehab PT Goals Patient Stated Goal: no goals stated Progress towards PT goals: Progressing toward goals    Frequency    7X/week      PT Plan Current plan remains appropriate    Co-evaluation              AM-PAC PT "6 Clicks" Mobility   Outcome Measure  Help needed turning from your back to your side while in a flat bed without using bedrails?: Total Help needed moving from lying on your back to sitting on the side of a flat bed without using bedrails?: Total Help needed moving to and from a bed to a chair (including a wheelchair)?: Total Help needed standing up from a chair using your arms (e.g., wheelchair or bedside chair)?: Total Help needed to walk in hospital room?:  Total Help needed climbing 3-5 steps with a railing? : Total 6 Click Score: 6    End of Session Equipment Utilized During Treatment: Oxygen Activity Tolerance: Treatment limited secondary to medical complications (Comment);Patient limited by pain(limited by baseline dementia/cognition deficits) Patient left: in bed;with call bell/phone within reach;with bed alarm set;with SCD's reapplied Nurse Communication: Mobility status PT Visit Diagnosis: Muscle weakness (generalized) (M62.81);Difficulty in walking, not elsewhere classified (R26.2);Pain Pain - Right/Left: Left Pain - part of body: Leg     Time: [id number] PT Time Calculation (min) (ACUTE ONLY): 13 min  Charges:  $Therapeutic Activity: 8-22 mins                     Julaine Fusi PTA 04/27/20, 2:15 PM

## 2020-04-27 NOTE — Progress Notes (Signed)
   04/27/20 1200  Family/Significant Other Communication  Family/Significant Other Update Called;Updated (sister [name])

## 2020-04-27 NOTE — Progress Notes (Signed)
   Subjective: 2 Days Post-Op Procedure(s) (LRB): INTRAMEDULLARY (IM) NAIL INTERTROCHANTRIC (Left) Patient alert this am. Denies having pain. No chest pain or SOB. NO abd pain.   Objective: Vital signs in last 24 hours: Temp:  [96.7 F (35.9 C)-98 F (36.7 C)] 97.6 F (36.4 C) (05/29 0021) Pulse Rate:  [86-99] 95 (05/29 0021) Resp:  [15-16] 16 (05/29 0021) BP: (75-130)/(20-90) 91/73 (05/29 0021) SpO2:  [98 %] 98 % (05/28 1600)  Intake/Output from previous day: 05/28 0701 - 05/29 0700 In: 984.8 [I.V.:634.8; IV Piggyback:350] Out: -  Intake/Output this shift: Total I/O In: -  Out: 50 [Urine:50]  Recent Labs    04/25/20 0519 04/26/20 0534 04/27/20 0554  HGB 9.8* 7.8* 7.3*   Recent Labs    04/26/20 0534 04/27/20 0554  WBC 13.2* 9.3  RBC 2.55* 2.35*  HCT 24.9* 23.6*  PLT 172 198   Recent Labs    04/25/20 0519 04/26/20 0534  NA 141 144  K 3.7 4.3  CL 108 111  CO2 26 28  BUN 21 27*  CREATININE 0.49 0.58  GLUCOSE 110* 122*  CALCIUM 8.5* 8.4*   No results for input(s): LABPT, INR in the last 72 hours.  EXAM General - Patient is Alert, Oriented and Confused Extremity - No cellulitis present Compartment soft Dressing - dressing C/D/I and no drainage Motor Function - normal ankle PF/DF BLE  Past Medical History:  Diagnosis Date  . Arthritis    hands  . Bladder cancer ([place])   . Brain bleed ([place]) 2019   d/t light strokes, high bp, seizures. brain has shrunk per dr. Manuella Ghazi and mri  . Cancer ([place])    BLADDER X 2  . Dementia ([place]) 2019   dementia progressing  . Depression   . GERD (gastroesophageal reflux disease)    chokes easily even on own saliva and then has panic attack  . Hyperlipidemia   . Hypertension 2019   no medications.  no htn per daughter  . Seizures [facility])    family not aware of any seizures and no treatment  . Stroke [facility]) 2019   undiagnosed but per neuro, she has had them causing vascular dementia  . Wears dentures    partial upper  and lower    Assessment/Plan:   2 Days Post-Op Procedure(s) (LRB): INTRAMEDULLARY (IM) NAIL INTERTROCHANTRIC (Left) Principal Problem:   Intertrochanteric fracture of left femur, closed, initial encounter [facility]) Active Problems:   Mixed Alzheimer's and vascular dementia ([place])   Essential (primary) hypertension   Fall   Intertrochanteric fracture of left femur ([place])  Estimated body mass index is 16.63 kg/m as calculated from the following:   Height as of 01/20/20: 5\' 1"  (1.549 m).   Weight as of this encounter: 39.9 kg. VSS Acute post op blood loss anemia with underlying chronic anemia- recheck Hgb in the am. Transfuse if Hgb <7. Pain well controlled   Remove staples and apply steri strips on 05/09/2020 Follow up with [facility] in 6 weeks TED hose BLE x 6 weeks Lovenox 40mg  SQ daily x 14 days at discharge   DVT Prophylaxis - Lovenox, Foot Pumps and TED hose Weight-Bearing as tolerated to left leg   T. Rachelle Hora, PA-C [place] 04/27/2020, 10:33 AM

## 2020-04-28 LAB — CBC
HCT: 21.2 % — ABNORMAL LOW (ref 36.0–46.0)
Hemoglobin: 6.7 g/dL — ABNORMAL LOW (ref 12.0–15.0)
MCH: 30.9 pg (ref 26.0–34.0)
MCHC: 31.6 g/dL (ref 30.0–36.0)
MCV: 97.7 fL (ref 80.0–100.0)
Platelets: 142 10*3/uL — ABNORMAL LOW (ref 150–400)
RBC: 2.17 MIL/uL — ABNORMAL LOW (ref 3.87–5.11)
RDW: 16.2 % — ABNORMAL HIGH (ref 11.5–15.5)
WBC: 5.3 10*3/uL (ref 4.0–10.5)
nRBC: 0 % (ref 0.0–0.2)

## 2020-04-28 LAB — PREPARE RBC (CROSSMATCH)

## 2020-04-28 MED ORDER — ACETAMINOPHEN 325 MG PO TABS
650.0000 mg | ORAL_TABLET | Freq: Once | ORAL | Status: AC
  Administered 2020-04-28: 16:00:00 650 mg via ORAL
  Filled 2020-04-28: qty 2

## 2020-04-28 MED ORDER — DEXTROSE-NACL 5-0.45 % IV SOLN: INTRAVENOUS | Status: DC

## 2020-04-28 MED ORDER — FAMOTIDINE 20 MG PO TABS
10.0000 mg | ORAL_TABLET | Freq: Every day | ORAL | Status: DC
  Administered 2020-04-28: 10:00:00 10 mg via ORAL

## 2020-04-28 NOTE — Progress Notes (Signed)
PT Cancellation Note  Patient Details Name: Gabrielle Crosby MRN: [id number] DOB: 25-Dec-1932   Cancelled Treatment:    Reason Eval/Treat Not Completed: Fatigue/lethargy limiting ability to participate   Discussed with RN prior to session.  Pt lethargic and generally not able to tolerate session today.  Decision to hold session.  Will continue as appropriate.    Chesley Noon 04/28/2020, 10:52 AM

## 2020-04-28 NOTE — Progress Notes (Signed)
   04/28/20 1744  Family/Significant Other Communication  Family/Significant Other Update Updated (andrea from pt's home hospice called)  andrea had password and permission from daughter to discuss care

## 2020-04-28 NOTE — Progress Notes (Addendum)
Notified MD to request dys 1 diet verbal orders received. Pt was only alert enough to eat 6 bites of her magic cut, 2 bites of peas, 3 bites of apple sauce. Unable to get pt to swallow ensure.  1426 notified MD Amery pt HGB 6.7 1715 attempted to feed pt dinner pt had 4 bites of food. Pt is lethargic and had to be reminded to swallow multiple times with each bite. Currently at this time it is unsafe to feed pt. At risk for aspiration.

## 2020-04-28 NOTE — Progress Notes (Addendum)
   04/28/20 1000  HEENT  Nose Other (Comment) (bleeding MD aware)   cleaning pt from BM rolled to right side, nose began to bleed. Notified MD Amery. Hold scheduled ASA still give Lovenox. Orders for CMP

## 2020-04-28 NOTE — Progress Notes (Signed)
   04/28/20 1854  Family/Significant Other Communication  Family/Significant Other Update Called;Updated (face time daughter)

## 2020-04-28 NOTE — Progress Notes (Signed)
   Subjective: 3 Days Post-Op Procedure(s) (LRB): INTRAMEDULLARY (IM) NAIL INTERTROCHANTRIC (Left) Patient alert this am. Denies having pain. No chest pain or SOB. NO abd pain.   Objective: Vital signs in last 24 hours: Temp:  [97.4 F (36.3 C)] 97.4 F (36.3 C) (05/30 0141) Pulse Rate:  [72] 72 (05/30 0141) Resp:  [16] 16 (05/30 0141) BP: (96)/(47) 96/47 (05/30 0141) SpO2:  [98 %] 98 % (05/30 0141)  Intake/Output from previous day: 05/29 0701 - 05/30 0700 In: 695.4 [I.V.:695.4] Out: 50 [Urine:50] Intake/Output this shift: No intake/output data recorded.  Recent Labs    04/26/20 0534 04/27/20 0554  HGB 7.8* 7.3*   Recent Labs    04/26/20 0534 04/27/20 0554  WBC 13.2* 9.3  RBC 2.55* 2.35*  HCT 24.9* 23.6*  PLT 172 198   Recent Labs    04/26/20 0534  NA 144  K 4.3  CL 111  CO2 28  BUN 27*  CREATININE 0.58  GLUCOSE 122*  CALCIUM 8.4*   No results for input(s): LABPT, INR in the last 72 hours.  EXAM General - Patient is Alert and Confused Extremity - No cellulitis present Compartment soft Dressing - dressing C/D/I and no drainage Motor Function - normal ankle PF/DF BLE  Past Medical History:  Diagnosis Date  . Arthritis    hands  . Bladder cancer ([place])   . Brain bleed ([place]) 2019   d/t light strokes, high bp, seizures. brain has shrunk per dr. Manuella Ghazi and mri  . Cancer ([place])    BLADDER X 2  . Dementia ([place]) 2019   dementia progressing  . Depression   . GERD (gastroesophageal reflux disease)    chokes easily even on own saliva and then has panic attack  . Hyperlipidemia   . Hypertension 2019   no medications.  no htn per daughter  . Seizures [facility])    family not aware of any seizures and no treatment  . Stroke [facility]) 2019   undiagnosed but per neuro, she has had them causing vascular dementia  . Wears dentures    partial upper and lower    Assessment/Plan:   3 Days Post-Op Procedure(s) (LRB): INTRAMEDULLARY (IM) NAIL INTERTROCHANTRIC  (Left) Principal Problem:   Intertrochanteric fracture of left femur, closed, initial encounter [facility]) Active Problems:   Mixed Alzheimer's and vascular dementia ([place])   Essential (primary) hypertension   Fall   Intertrochanteric fracture of left femur ([place])  Estimated body mass index is 16.63 kg/m as calculated from the following:   Height as of 01/20/20: 5\' 1"  (1.549 m).   Weight as of this encounter: 39.9 kg. VSS Acute post op blood loss anemia with underlying chronic anemia- Hgb pending this am. Transfuse if Hgb <7. Pain well controlled   Remove staples and apply steri strips on 05/09/2020 Follow up with [facility] in 6 weeks TED hose BLE x 6 weeks Lovenox 40mg  SQ daily x 14 days at discharge   DVT Prophylaxis - Lovenox, Foot Pumps and TED hose Weight-Bearing as tolerated to left leg   T. Rachelle Hora, PA-C [place] 04/28/2020, 9:41 AM

## 2020-04-28 NOTE — Progress Notes (Addendum)
   04/28/20 1200  Family/Significant Other Communication  Family/Significant Other Update Called;Updated (daughter)  Daughter tearful unsure what to do about her care. Sates she wants to come see her and believes her positive covid test is a false postive because she has had both vaccines,  not displayed any symptoms and not had contact with anyone with covid. Request her mom to be retested. notified MD amery. MD states pt can not be retested her at hospital at this time.

## 2020-04-28 NOTE — Progress Notes (Signed)
PROGRESS NOTE    Gabrielle Crosby  [id number] DOB: 08-Sep-1933 DOA: 04/24/2020 PCP: Toni Arthurs, NP    Brief Narrative:  Gabrielle Crosby is a 86 y.o. female with medical history significant for advanced dementia, hypertension, seizures and stroke who was brought into the emergency room by EMS after an unwitnessed fall in the skilled nursing facility resulting in left hip pain and bruising of the forehead and left eye.  Patient is unable to provide any history but complains of left hip pain with palpitation.  Time of fall and reason for fall is unknown since patient is a poor historian. Patient's COVID 19 test is positive but per daughter she has completed her vaccination series   Consultants:   Orthopedics  Procedures:   Antimicrobials: Left  Cefazolin x1   Subjective: Eyes closed, responding to questions more today. When ask if has pain, states no. But unable to tell me if sob, or any thing else as she begins to just moan to my questions.  Afterwards nursing stated patient had a little nosebleed may be about 10 mL but it stopped as she rolled over to her side .  Decreased p.o. intake  objective: Vitals:   04/26/20 1600 04/26/20 1735 04/27/20 0021 04/28/20 0141  BP: (!) 114/46 (!) 116/50 91/73 (!) 96/47  Pulse: 99  95 72  Resp: 16  16 16   Temp: 97.7 F (36.5 C) 98 F (36.7 C) 97.6 F (36.4 C) (!) 97.4 F (36.3 C)  TempSrc: Axillary  Axillary Axillary  SpO2: 98%   98%  Weight:        Intake/Output Summary (Last 24 hours) at 04/28/2020 1011 Last data filed at 04/28/2020 0300 Gross per 24 hour  Intake 695.37 ml  Output 50 ml  Net 645.37 ml   Filed Weights   04/24/20 0818  Weight: 39.9 kg    Examination:  General exam: eyes, closed, moaning. Nad, pale Heent: DMM Respiratory system: Clear to auscultation. With poor respiratory effort normal anteriorly. No w/r/r Cardiovascular system: S1 & S2 heard, RRR. No JVD, murmurs, rubs, gallops or clicks.   Gastrointestinal system: Abdomen is nondistended, soft and nontender.  Normal bowel sounds heard. Central nervous system: Unable to assess Extremities: No edema Skin: Warm and dry Psychiatry: Unable to assess    Data Reviewed: I have personally reviewed following labs and imaging studies  CBC: Recent Labs  Lab 04/24/20 0822 04/25/20 0519 04/26/20 0534 04/27/20 0554  WBC 6.7 9.1 13.2* 9.3  NEUTROABS 4.9  --   --   --   HGB 11.1* 9.8* 7.8* 7.3*  HCT 35.4* 29.9* 24.9* 23.6*  MCV 96.2 95.2 97.6 100.4*  PLT 269 209 172 [id number]   Basic Metabolic Panel: Recent Labs  Lab 04/24/20 0822 04/25/20 0519 04/26/20 0534  NA 142 141 144  K 3.9 3.7 4.3  CL 107 108 111  CO2 29 26 28   GLUCOSE 128* 110* 122*  BUN 15 21 27*  CREATININE 0.46 0.49 0.58  CALCIUM 8.5* 8.5* 8.4*   GFR: Estimated Creatinine Clearance: 33 mL/min (by C-G formula based on SCr of 0.58 mg/dL). Liver Function Tests: Recent Labs  Lab 04/24/20 0822  AST 50*  ALT 27  ALKPHOS 120  BILITOT 0.8  PROT 7.0  ALBUMIN 3.3*   No results for input(s): LIPASE, AMYLASE in the last 168 hours. No results for input(s): AMMONIA in the last 168 hours. Coagulation Profile: No results for input(s): INR, PROTIME in the last 168 hours. Cardiac Enzymes: No results  for input(s): CKTOTAL, CKMB, CKMBINDEX, TROPONINI in the last 168 hours. BNP (last 3 results) No results for input(s): PROBNP in the last 8760 hours. HbA1C: No results for input(s): HGBA1C in the last 72 hours. CBG: No results for input(s): GLUCAP in the last 168 hours. Lipid Profile: No results for input(s): CHOL, HDL, LDLCALC, TRIG, CHOLHDL, LDLDIRECT in the last 72 hours. Thyroid Function Tests: No results for input(s): TSH, T4TOTAL, FREET4, T3FREE, THYROIDAB in the last 72 hours. Anemia Panel: No results for input(s): VITAMINB12, FOLATE, FERRITIN, TIBC, IRON, RETICCTPCT in the last 72 hours. Sepsis Labs: Recent Labs  Lab 04/26/20 1556 04/26/20 1826   LATICACIDVEN 1.1 1.2    Recent Results (from the past 240 hour(s))  SARS Coronavirus 2 by RT PCR (hospital order, performed in [facility] hospital lab) Nasopharyngeal Urine, Catheterized     Status: Abnormal   Collection Time: 04/24/20  8:44 AM   Specimen: Urine, Catheterized; Nasopharyngeal  Result Value Ref Range Status   SARS Coronavirus 2 POSITIVE (A) NEGATIVE Final    Comment: CRITICAL RESULT CALLED TO, READ BACK BY AND VERIFIED WITH: ALLY YOW AT [id number] ON 04/24/2020 [place]. (NOTE) SARS-CoV-2 target nucleic acids are DETECTED SARS-CoV-2 RNA is generally detectable in upper respiratory specimens  during the acute phase of infection.  Positive results are indicative  of the presence of the identified virus, but do not rule out bacterial infection or co-infection with other pathogens not detected by the test.  Clinical correlation with patient history and  other diagnostic information is necessary to determine patient infection status.  The expected result is negative. Fact Sheet for Patients:   StrictlyIdeas.no  Fact Sheet for Healthcare Providers:   BankingDealers.co.za   This test is not yet approved or cleared by the [place] FDA and  has been authorized for detection and/or diagnosis of SARS-CoV-2 by FDA under an Emergency Use Authorization (EUA).  This EUA will remain in effect (meaning this t est can be used) for the duration of  the COVID-19 declaration under Section 564(b)(1) of the Act, 21 U.S.C. section 360-bbb-3(b)(1), unless the authorization is terminated or revoked sooner. Performed at [facility], [address] [place], [place] [address]   MRSA PCR Screening     Status: None   Collection Time: 04/25/20 10:48 AM   Specimen: Nasopharyngeal  Result Value Ref Range Status   MRSA by PCR NEGATIVE NEGATIVE Final    Comment:        The GeneXpert MRSA Assay (FDA approved for NASAL specimens only), is one  component of a comprehensive MRSA colonization surveillance program. It is not intended to diagnose MRSA infection nor to guide or monitor treatment for MRSA infections. Performed at [facility], [address], [place], [place] [address]          Radiology Studies: CT ANGIO CHEST PE W OR WO CONTRAST  Result Date: 04/27/2020 CLINICAL DATA:  Worsening hypoxia. Postop from internal fixation left hip fracture. Clinical suspicion for pulmonary embolism. EXAM: CT ANGIOGRAPHY CHEST WITH CONTRAST TECHNIQUE: Multidetector CT imaging of the chest was performed using the standard protocol during bolus administration of intravenous contrast. Multiplanar CT image reconstructions and MIPs were obtained to evaluate the vascular anatomy. CONTRAST:  23mL OMNIPAQUE IOHEXOL 350 MG/ML SOLN COMPARISON:  None. FINDINGS: Cardiovascular: Satisfactory opacification of pulmonary arteries noted, and no pulmonary emboli identified. No evidence of thoracic aortic dissection or aneurysm. Aortic and coronary artery atherosclerosis noted. Mediastinum/Nodes: No masses or pathologically enlarged lymph nodes identified. Lungs/Pleura: Mild  dependent atelectasis seen in the lung bases. Tiny left pleural effusion also noted. No evidence of pulmonary consolidation or mass. Upper abdomen: No acute findings. Musculoskeletal: No suspicious bone lesions identified. Review of the MIP images confirms the above findings. IMPRESSION: 1. No evidence of pulmonary embolism. 2. Mild bibasilar atelectasis and tiny left pleural effusion. Aortic Atherosclerosis (ICD10-I70.0). Electronically Signed   By: Marlaine Hind M.D.   On: 04/27/2020 17:24   DG Chest Port 1 View  Result Date: 04/26/2020 CLINICAL DATA:  Unwitnessed fall. EXAM: PORTABLE CHEST 1 VIEW COMPARISON:  Chest x-ray 04/24/2020. FINDINGS: Patient is rotated to the left. Mediastinum hilar structures normal. No acute pulmonary abnormality identified. Stable mild left base  pleural thickening most likely scarring. No pneumothorax. Degenerative change thoracolumbar spine. Prior lumbar vertebroplasties. Old-appearing bilateral posterior-lateral eighth rib fractures. No acute abnormality identified. IMPRESSION: No acute abnormality identified. Electronically Signed   By: Marcello Moores  Register   On: 04/26/2020 12:09        Scheduled Meds: . acetaminophen  500 mg Oral BID  . aspirin EC  325 mg Oral Daily  . cholecalciferol  2,000 Units Oral Daily  . citalopram  20 mg Oral Daily  . clonazePAM  0.5 mg Oral BID  . docusate sodium  100 mg Oral BID  . enoxaparin (LOVENOX) injection  30 mg Subcutaneous Q24H  . feeding supplement (ENSURE ENLIVE)  237 mL Oral BID BM  . fluticasone  1 spray Each Nare Daily  . melatonin  5 mg Oral QHS  . memantine  10 mg Oral BID  . multivitamin with minerals  1 tablet Oral Daily  . polyethylene glycol  17 g Oral Daily  . pravastatin  20 mg Oral q1800  . QUEtiapine  25 mg Oral q morning - 10a  . QUEtiapine  50 mg Oral QHS  . traMADol  50 mg Oral Q6H  . traZODone  50 mg Oral QHS  . vitamin B-12  1,000 mcg Oral Daily   Continuous Infusions: . sodium chloride Stopped (04/28/20 0145)  . methocarbamol (ROBAXIN) IV      Assessment & Plan:   Principal Problem:   Intertrochanteric fracture of left femur, closed, initial encounter [facility]) Active Problems:   Mixed Alzheimer's and vascular dementia ([place])   Essential (primary) hypertension   Fall   Intertrochanteric fracture of left femur ([place])   Status post fall Ortho consulted.  status post fall with intertrochanteric fracture of left femur-s/p Repair   fall precautions Pain control Remove staples and apply steri strips on 05/09/2020 Follow up with [place] ortho in 6 weeks TED hose BLE x 6 weeks Lovenox 40mg  SQ daily x 14 days at discharge  Acute drop h/h- 2/2 surgery. Labs still pending this am, changed it to stat. Told nsg to f/u with lab to make sur its drawn Will transfuse if Hg  <7.  Nose bleed- stopped when pt rolled to her side. Will hold asa Continue with post op lovenox Possibly nose bleed from 0xygen. Discussed with nsg. F/u cbc   Mixed Alzheimer's and vascular dementia Continue Citalopram, Seroquel, Trazodone and Memantine   Hypertension BP on low side.  Likely due to acute blood loss from surgery.  Also decreased p.o. intake. DC'd lisinopril for now Continue with ivf Encouraged nsg to have tech help with feeding /ensure  COvid + test- no respiratory sx.  cxr negatrive. CTA chest no PE, atelectasis and small amount of left pleural effusion Will consult palliative care  DVT prophylaxis: SCD Code Status: DNR  Family Communication: Tried calling daughter, went to VM Disposition Plan: needs snf Barrier: needs snf placement, palliative care consulted. Needs safe d/c planning before discharge.     LOS: 4 days   Time spent: 45 minutes with more than 50% on [place], MD [facility] Pager [phone]  If 7PM-7AM, please contact night-coverage [email or web address] Password [facility] 04/28/2020, 10:11 AM Patient ID: MATTILYN HUENINK, female   DOB: Jul 21, 1933, 86 y.o.   MRN: [id number]

## 2020-04-29 DIAGNOSIS — Z7189 Other specified counseling: Secondary | ICD-10-CM

## 2020-04-29 DIAGNOSIS — Z515 Encounter for palliative care: Secondary | ICD-10-CM

## 2020-04-29 LAB — BASIC METABOLIC PANEL
Anion gap: 7 (ref 5–15)
BUN: 16 mg/dL (ref 8–23)
CO2: 27 mmol/L (ref 22–32)
Calcium: 8.2 mg/dL — ABNORMAL LOW (ref 8.9–10.3)
Chloride: 114 mmol/L — ABNORMAL HIGH (ref 98–111)
Creatinine, Ser: 0.49 mg/dL (ref 0.44–1.00)
GFR calc Af Amer: >60 mL/min (ref >60)
GFR calc non Af Amer: >60 mL/min (ref >60)
Glucose, Bld: 97 mg/dL (ref 70–99)
Potassium: 3.2 mmol/L — ABNORMAL LOW (ref 3.5–5.1)
Sodium: 148 mmol/L — ABNORMAL HIGH (ref 135–145)

## 2020-04-29 LAB — CBC
HCT: 26 % — ABNORMAL LOW (ref 36.0–46.0)
Hemoglobin: 8.3 g/dL — ABNORMAL LOW (ref 12.0–15.0)
MCH: 30.2 pg (ref 26.0–34.0)
MCHC: 31.9 g/dL (ref 30.0–36.0)
MCV: 94.5 fL (ref 80.0–100.0)
Platelets: 187 10*3/uL (ref 150–400)
RBC: 2.75 MIL/uL — ABNORMAL LOW (ref 3.87–5.11)
RDW: 18.4 % — ABNORMAL HIGH (ref 11.5–15.5)
WBC: 4.5 10*3/uL (ref 4.0–10.5)
nRBC: 0 % (ref 0.0–0.2)

## 2020-04-29 LAB — TYPE AND SCREEN
ABO/RH(D): O NEG
Antibody Screen: NEGATIVE
Unit division: 0

## 2020-04-29 LAB — BPAM RBC
Blood Product Expiration Date: 202106032359
ISSUE DATE / TIME: 202105301641
Unit Type and Rh: 9500

## 2020-04-29 MED ORDER — POTASSIUM CHLORIDE 10 MEQ/100ML IV SOLN
10.0000 meq | INTRAVENOUS | Status: AC
  Administered 2020-04-29 (×4): 10 meq via INTRAVENOUS
  Filled 2020-04-29 (×2): qty 100

## 2020-04-29 MED ORDER — DEXTROSE-NACL 5-0.45 % IV SOLN: INTRAVENOUS | Status: DC

## 2020-04-29 NOTE — Progress Notes (Signed)
Gabrielle Crosby daughter given the nightly update

## 2020-04-29 NOTE — Progress Notes (Signed)
   Subjective: 4 Days Post-Op Procedure(s) (LRB): INTRAMEDULLARY (IM) NAIL INTERTROCHANTRIC (Left) Patient sleeping this am. Denies having pain. Patient less alert this am.  Objective: Vital signs in last 24 hours: Temp:  [98.4 F (36.9 C)-98.9 F (37.2 C)] 98.4 F (36.9 C) (05/31 0817) Pulse Rate:  [70-91] 89 (05/31 0817) Resp:  [11-19] 16 (05/31 0817) BP: (101-124)/(60-76) 117/73 (05/31 0817) SpO2:  [92 %-100 %] 92 % (05/31 0817)  Intake/Output from previous day: 05/30 0701 - 05/31 0700 In: 1503 [I.V.:943; Blood:560] Out: -  Intake/Output this shift: No intake/output data recorded.  Recent Labs    04/27/20 0554 04/28/20 1259 04/29/20 0552  HGB 7.3* 6.7* 8.3*   Recent Labs    04/28/20 1259 04/29/20 0552  WBC 5.3 4.5  RBC 2.17* 2.75*  HCT 21.2* 26.0*  PLT 142* 187   Recent Labs    04/29/20 0552  NA 148*  K 3.2*  CL 114*  CO2 27  BUN 16  CREATININE 0.49  GLUCOSE 97  CALCIUM 8.2*   No results for input(s): LABPT, INR in the last 72 hours.  EXAM General - Patient is Alert and Confused Extremity - No cellulitis present Compartment soft Dressing - dressing C/D/I and no drainage Motor Function - normal ankle PF/DF BLE  Past Medical History:  Diagnosis Date  . Arthritis    hands  . Bladder cancer ([place])   . Brain bleed ([place]) 2019   d/t light strokes, high bp, seizures. brain has shrunk per dr. Manuella Ghazi and mri  . Cancer ([place])    BLADDER X 2  . Dementia ([place]) 2019   dementia progressing  . Depression   . GERD (gastroesophageal reflux disease)    chokes easily even on own saliva and then has panic attack  . Hyperlipidemia   . Hypertension 2019   no medications.  no htn per daughter  . Seizures [facility])    family not aware of any seizures and no treatment  . Stroke [facility]) 2019   undiagnosed but per neuro, she has had them causing vascular dementia  . Wears dentures    partial upper and lower    Assessment/Plan:   4 Days Post-Op Procedure(s)  (LRB): INTRAMEDULLARY (IM) NAIL INTERTROCHANTRIC (Left) Principal Problem:   Intertrochanteric fracture of left femur, closed, initial encounter [facility]) Active Problems:   Mixed Alzheimer's and vascular dementia ([place])   Essential (primary) hypertension   Fall   Intertrochanteric fracture of left femur ([place])  Estimated body mass index is 16.63 kg/m as calculated from the following:   Height as of 01/20/20: 5\' 1"  (1.549 m).   Weight as of this encounter: 39.9 kg. VSS Acute post op blood loss anemia with underlying chronic anemia- Hgb 8.3 Pain well controlled   Remove staples and apply steri strips on 05/09/2020 Follow up with [facility] in 6 weeks TED hose BLE x 6 weeks Lovenox 40mg  SQ daily x 14 days at discharge   DVT Prophylaxis - Lovenox, Foot Pumps and TED hose Weight-Bearing as tolerated to left leg   T. Rachelle Hora, PA-C [place] 04/29/2020, 8:50 AM

## 2020-04-29 NOTE — Progress Notes (Signed)
Updated daughter over phone, imformed daughter she can come up to visit mom per Mervin Kung they would make an exception so family can see to make a decision on care. dauther dais she would let nurse know when she comes. Pt not alert enough to swallow pills or eat breakfast

## 2020-04-29 NOTE — Progress Notes (Addendum)
PROGRESS NOTE    Gabrielle Crosby  [id number] DOB: [birth year] DOA: 04/24/2020 PCP: Toni Arthurs, NP    Brief Narrative:  Gabrielle Crosby is a 86 y.o. female with medical history significant for advanced dementia, hypertension, seizures and stroke who was brought into the emergency room by EMS after an unwitnessed fall in the skilled nursing facility resulting in left hip pain and bruising of the forehead and left eye.  Patient is unable to provide any history but complains of left hip pain with palpitation.  Time of fall and reason for fall is unknown since patient is a poor historian. Patient's COVID 19 test is positive but per daughter she has completed her vaccination series   Consultants:   Orthopedics  Procedures:   Antimicrobials: Left  Cefazolin x1   Subjective: Eyes closed, tearful, when I asked her if shes in pain, she stopped crying but did not answer. I told nsg to ck on pt again to see if needs pain meds.  No overnight issues reported  objective: Vitals:   04/28/20 1722 04/28/20 2000 04/29/20 0033 04/29/20 0405  BP: 118/60 122/76 124/75 117/60  Pulse:  70 91   Resp:  19 13 13   Temp: 98.7 F (37.1 C) 98.9 F (37.2 C) 98.7 F (37.1 C) 98.4 F (36.9 C)  TempSrc: Oral Oral Axillary Axillary  SpO2:  100% 100% 100%  Weight:        Intake/Output Summary (Last 24 hours) at 04/29/2020 0814 Last data filed at 04/29/2020 0400 Gross per 24 hour  Intake 1502.98 ml  Output -  Net 1502.98 ml   Filed Weights   04/24/20 0818  Weight: 39.9 kg    Examination:  General exam: eyes closed, NAD, less pale Heent: DMM Respiratory system: Clear to auscultation anteriorly. With poor respiratory effort normal . No w/r/r Cardiovascular system: S1 & S2 heard, RRR. No JVD, murmurs, rubs, gallops or clicks.  Gastrointestinal system: Abdomen is nondistended, soft and nontender.  Normal bowel sounds heard. Central nervous system: Unable to assess Extremities: No  edema Skin: Warm and dry Psychiatry: Unable to assess    Data Reviewed: I have personally reviewed following labs and imaging studies  CBC: Recent Labs  Lab 04/24/20 0822 04/24/20 0822 04/25/20 0519 04/26/20 0534 04/27/20 0554 04/28/20 1259 04/29/20 0552  WBC 6.7   < > 9.1 13.2* 9.3 5.3 4.5  NEUTROABS 4.9  --   --   --   --   --   --   HGB 11.1*   < > 9.8* 7.8* 7.3* 6.7* 8.3*  HCT 35.4*   < > 29.9* 24.9* 23.6* 21.2* 26.0*  MCV 96.2   < > 95.2 97.6 100.4* 97.7 94.5  PLT 269   < > 209 172 198 142* 187   < > = values in this interval not displayed.   Basic Metabolic Panel: Recent Labs  Lab 04/24/20 0822 04/25/20 0519 04/26/20 0534 04/29/20 0552  NA 142 141 144 148*  K 3.9 3.7 4.3 3.2*  CL 107 108 111 114*  CO2 29 26 28 27   GLUCOSE 128* 110* 122* 97  BUN 15 21 27* 16  CREATININE 0.46 0.49 0.58 0.49  CALCIUM 8.5* 8.5* 8.4* 8.2*   GFR: Estimated Creatinine Clearance: 33 mL/min (by C-G formula based on SCr of 0.49 mg/dL). Liver Function Tests: Recent Labs  Lab 04/24/20 0822  AST 50*  ALT 27  ALKPHOS 120  BILITOT 0.8  PROT 7.0  ALBUMIN 3.3*   No results  for input(s): LIPASE, AMYLASE in the last 168 hours. No results for input(s): AMMONIA in the last 168 hours. Coagulation Profile: No results for input(s): INR, PROTIME in the last 168 hours. Cardiac Enzymes: No results for input(s): CKTOTAL, CKMB, CKMBINDEX, TROPONINI in the last 168 hours. BNP (last 3 results) No results for input(s): PROBNP in the last 8760 hours. HbA1C: No results for input(s): HGBA1C in the last 72 hours. CBG: No results for input(s): GLUCAP in the last 168 hours. Lipid Profile: No results for input(s): CHOL, HDL, LDLCALC, TRIG, CHOLHDL, LDLDIRECT in the last 72 hours. Thyroid Function Tests: No results for input(s): TSH, T4TOTAL, FREET4, T3FREE, THYROIDAB in the last 72 hours. Anemia Panel: No results for input(s): VITAMINB12, FOLATE, FERRITIN, TIBC, IRON, RETICCTPCT in the last 72  hours. Sepsis Labs: Recent Labs  Lab 04/26/20 1556 04/26/20 1826  LATICACIDVEN 1.1 1.2    Recent Results (from the past 240 hour(s))  SARS Coronavirus 2 by RT PCR (hospital order, performed in [facility] hospital lab) Nasopharyngeal Urine, Catheterized     Status: Abnormal   Collection Time: 04/24/20  8:44 AM   Specimen: Urine, Catheterized; Nasopharyngeal  Result Value Ref Range Status   SARS Coronavirus 2 POSITIVE (A) NEGATIVE Final    Comment: CRITICAL RESULT CALLED TO, READ BACK BY AND VERIFIED WITH: ALLY YOW AT [id number] ON 04/24/2020 [place]. (NOTE) SARS-CoV-2 target nucleic acids are DETECTED SARS-CoV-2 RNA is generally detectable in upper respiratory specimens  during the acute phase of infection.  Positive results are indicative  of the presence of the identified virus, but do not rule out bacterial infection or co-infection with other pathogens not detected by the test.  Clinical correlation with patient history and  other diagnostic information is necessary to determine patient infection status.  The expected result is negative. Fact Sheet for Patients:   StrictlyIdeas.no  Fact Sheet for Healthcare Providers:   BankingDealers.co.za   This test is not yet approved or cleared by the [place] FDA and  has been authorized for detection and/or diagnosis of SARS-CoV-2 by FDA under an Emergency Use Authorization (EUA).  This EUA will remain in effect (meaning this t est can be used) for the duration of  the COVID-19 declaration under Section 564(b)(1) of the Act, 21 U.S.C. section 360-bbb-3(b)(1), unless the authorization is terminated or revoked sooner. Performed at [facility], [address] [place], [place] [address]   MRSA PCR Screening     Status: None   Collection Time: 04/25/20 10:48 AM   Specimen: Nasopharyngeal  Result Value Ref Range Status   MRSA by PCR NEGATIVE NEGATIVE Final    Comment:        The  GeneXpert MRSA Assay (FDA approved for NASAL specimens only), is one component of a comprehensive MRSA colonization surveillance program. It is not intended to diagnose MRSA infection nor to guide or monitor treatment for MRSA infections. Performed at [facility], [address] [place], [place] [address]          Radiology Studies: CT ANGIO CHEST PE W OR WO CONTRAST  Result Date: 04/27/2020 CLINICAL DATA:  Worsening hypoxia. Postop from internal fixation left hip fracture. Clinical suspicion for pulmonary embolism. EXAM: CT ANGIOGRAPHY CHEST WITH CONTRAST TECHNIQUE: Multidetector CT imaging of the chest was performed using the standard protocol during bolus administration of intravenous contrast. Multiplanar CT image reconstructions and MIPs were obtained to evaluate the vascular anatomy. CONTRAST:  8mL OMNIPAQUE IOHEXOL 350 MG/ML SOLN COMPARISON:  None. FINDINGS: Cardiovascular:  Satisfactory opacification of pulmonary arteries noted, and no pulmonary emboli identified. No evidence of thoracic aortic dissection or aneurysm. Aortic and coronary artery atherosclerosis noted. Mediastinum/Nodes: No masses or pathologically enlarged lymph nodes identified. Lungs/Pleura: Mild dependent atelectasis seen in the lung bases. Tiny left pleural effusion also noted. No evidence of pulmonary consolidation or mass. Upper abdomen: No acute findings. Musculoskeletal: No suspicious bone lesions identified. Review of the MIP images confirms the above findings. IMPRESSION: 1. No evidence of pulmonary embolism. 2. Mild bibasilar atelectasis and tiny left pleural effusion. Aortic Atherosclerosis (ICD10-I70.0). Electronically Signed   By: Marlaine Hind M.D.   On: 04/27/2020 17:24        Scheduled Meds: . acetaminophen  500 mg Oral BID  . aspirin EC  325 mg Oral Daily  . cholecalciferol  2,000 Units Oral Daily  . citalopram  20 mg Oral Daily  . clonazePAM  0.5 mg Oral BID  . docusate sodium   100 mg Oral BID  . enoxaparin (LOVENOX) injection  30 mg Subcutaneous Q24H  . famotidine  10 mg Oral Daily  . feeding supplement (ENSURE ENLIVE)  237 mL Oral BID BM  . fluticasone  1 spray Each Nare Daily  . melatonin  5 mg Oral QHS  . memantine  10 mg Oral BID  . multivitamin with minerals  1 tablet Oral Daily  . polyethylene glycol  17 g Oral Daily  . pravastatin  20 mg Oral q1800  . QUEtiapine  25 mg Oral q morning - 10a  . QUEtiapine  50 mg Oral QHS  . traMADol  50 mg Oral Q6H  . traZODone  50 mg Oral QHS  . vitamin B-12  1,000 mcg Oral Daily   Continuous Infusions: . dextrose 5 % and 0.45% NaCl 50 mL/hr at 04/29/20 0400  . dextrose 5 % and 0.45% NaCl    . methocarbamol (ROBAXIN) IV    . potassium chloride      Assessment & Plan:   Principal Problem:   Intertrochanteric fracture of left femur, closed, initial encounter [facility]) Active Problems:   Mixed Alzheimer's and vascular dementia ([place])   Essential (primary) hypertension   Fall   Intertrochanteric fracture of left femur ([place])   Status post fall Ortho consulted.  status post fall with intertrochanteric fracture of left femur-s/p Repair   fall precautions Pain control Remove staples and apply steri strips on 05/09/2020 Follow up with [facility] in 6 weeks TED hose BLE x 6 weeks Lovenox 40mg  SQ daily x 14 days at discharge  Postop acute drop h/h- 2/2 surgery. Status post 1 unit packed red blood cells transfusion on 04/28/2020.  Hemoglobin 8.3 this AM. Will transfuse if Hg <7.  Nose bleed- stopped when pt rolled to her side on 04/28/2020. Continue to hold aspirin for now Continue with post op lovenox Possibly nose bleed from 0xygen. Discussed with nsg. H&H stable this a.m. posttransfusion  Mixed Alzheimer's and vascular dementia Continue Citalopram, Seroquel, Trazodone and Memantine   Hypertension BP on low side.  Likely due to acute blood loss from surgery.  Also decreased p.o. intake. DC'd lisinopril for  now Continue with ivf as BP improving Encouraged nsg to have tech help with feeding /ensure  COvid + test- no respiratory sx.  cxr negatrive. CTA chest no PE, atelectasis and small amount of left pleural effusion consulted palliative care pending  Hypokalemia-mild Will replace with KCl 40 mEq IV x1 Monitor level  DVT prophylaxis: SCD Code Status: DNR Family Communication: Tried  calling daughter, went to VM Disposition Plan: needs snf Barrier: needs snf placement, palliative care consulted. Needs safe d/c planning before discharge.     LOS: 5 days   Time spent: 45 minutes with more than 50% on [place], MD [facility] Pager [phone]  If 7PM-7AM, please contact night-coverage [email or web address] Password [facility] 04/29/2020, 8:14 AM Patient ID: Gabrielle Crosby, female   DOB: Apr 01, 1933, 86 y.o.   MRN: [id number] Patient ID: Gabrielle Crosby, female   DOB: 1933/10/22, 86 y.o.   MRN: [id number]

## 2020-04-29 NOTE — Consult Note (Addendum)
Consultation Note Date: 04/29/2020   Patient Name: Gabrielle Crosby  DOB: [birth year]  MRN: [id number]  Age / Sex: 86 y.o., female  PCP: Toni Arthurs, NP Referring Physician: Nolberto Hanlon, MD  Reason for Consultation: Establishing goals of care  HPI/Patient Profile:  JALEESE Crosby is a 86 y.o. female with medical history significant for advanced dementia, hypertension, seizures and stroke who was brought into the emergency room by EMS after an unwitnessed fall in the skilled nursing facility resulting in left hip pain and bruising of the forehead and left eye.    Clinical Assessment and Goals of Care: Patient is resting in bed with eyes closed. Patient is on covid isolation.   Spoke with daughter on phone. She states her mother lived with her and her husband from 2017 until last September. She states as her dementia progressed, she had to move into the ALF.   Functionally, Ms. Pattillo uses a wheelchair, and does a limited amount of walking. Her mother is incontinent of bowel and bladder. She can feed herself at times, but daughter states she is messy, and many times, she needs to be fed. She does not recognize her family members most of the time, and calls her daughter "mother". She states patient had a stroke last August and before the stroke, she was able to hold her head up and hold her eyes open more.   We discussed her diagnosis, prognosis, GOC, EOL wishes disposition and options.  A detailed discussion was had today regarding advanced directives.  Concepts specific to code status, artifical feeding and hydration, IV antibiotics and rehospitalization were discussed.  The difference between an aggressive medical intervention path and a comfort care path was discussed.  Values and goals of care important to patient and family were attempted to be elicited.  Discussed limitations of medical  interventions to prolong quality of life in some situations and discussed the concept of human mortality.  She states her mother has made arrangements for death. She states she has voiced she wished the [facility] would just take her. She states as her daughter, she would like for her to live longer to spend time with family and get somewhere close to where she was, but does not want her mother to suffer. Inquired as to what her mother would tell us to do if she could, and she indicates her mother would want to shift to comfort care.   She discusses other deaths and health issues with family members.   She would like to speak with family and is considering bringing her mother home with hospice as she has a 70 year old.    SUMMARY OF RECOMMENDATIONS   Daughter is considering bringing patient home with hospice. She wants a covid retest prior to bringing mother home with hospice.       Primary Diagnoses: Present on Admission: . Mixed Alzheimer's and vascular dementia ([place]) . Essential (primary) hypertension . Intertrochanteric fracture of left femur, closed, initial encounter ([place]) . Fall . Intertrochanteric fracture of left femur (  [place])   I have reviewed the medical record, interviewed the patient and family, and examined the patient. The following aspects are pertinent.  Past Medical History:  Diagnosis Date  . Arthritis    hands  . Bladder cancer ([place])   . Brain bleed ([place]) 2019   d/t light strokes, high bp, seizures. brain has shrunk per dr. Manuella Ghazi and mri  . Cancer ([place])    BLADDER X 2  . Dementia ([place]) 2019   dementia progressing  . Depression   . GERD (gastroesophageal reflux disease)    chokes easily even on own saliva and then has panic attack  . Hyperlipidemia   . Hypertension 2019   no medications.  no htn per daughter  . Seizures [facility])    family not aware of any seizures and no treatment  . Stroke [facility]) 2019   undiagnosed but per neuro, she has had them causing vascular  dementia  . Wears dentures    partial upper and lower   Social History   Socioeconomic History  . Marital status: Married    Spouse name: Not on file  . Number of children: Not on file  . Years of education: Not on file  . Highest education level: Not on file  Occupational History  . Not on file  Tobacco Use  . Smoking status: Never Smoker  . Smokeless tobacco: Never Used  Substance and Sexual Activity  . Alcohol use: Never    Alcohol/week: 0.0 standard drinks  . Drug use: No  . Sexual activity: Never  Other Topics Concern  . Not on file  Social History Narrative   Pt confused unable to answer questions   Social Determinants of Health   Financial Resource Strain:   . Difficulty of Paying Living Expenses:   Food Insecurity:   . Worried About Charity fundraiser in the Last Year:   . Arboriculturist in the Last Year:   Transportation Needs:   . Film/video editor (Medical):   Marland Kitchen Lack of Transportation (Non-Medical):   Physical Activity:   . Days of Exercise per Week:   . Minutes of Exercise per Session:   Stress:   . Feeling of Stress :   Social Connections:   . Frequency of Communication with Friends and Family:   . Frequency of Social Gatherings with Friends and Family:   . Attends Religious Services:   . Active Member of Clubs or Organizations:   . Attends Archivist Meetings:   Marland Kitchen Marital Status:    Family History  Family history unknown: Yes   Scheduled Meds: . acetaminophen  500 mg Oral BID  . aspirin EC  325 mg Oral Daily  . cholecalciferol  2,000 Units Oral Daily  . citalopram  20 mg Oral Daily  . clonazePAM  0.5 mg Oral BID  . docusate sodium  100 mg Oral BID  . enoxaparin (LOVENOX) injection  30 mg Subcutaneous Q24H  . famotidine  10 mg Oral Daily  . feeding supplement (ENSURE ENLIVE)  237 mL Oral BID BM  . fluticasone  1 spray Each Nare Daily  . melatonin  5 mg Oral QHS  . memantine  10 mg Oral BID  . multivitamin with minerals  1  tablet Oral Daily  . polyethylene glycol  17 g Oral Daily  . pravastatin  20 mg Oral q1800  . QUEtiapine  25 mg Oral q morning - 10a  . QUEtiapine  50 mg Oral QHS  .  traMADol  50 mg Oral Q6H  . traZODone  50 mg Oral QHS  . vitamin B-12  1,000 mcg Oral Daily   Continuous Infusions: . dextrose 5 % and 0.45% NaCl 50 mL/hr at 04/29/20 0900  . methocarbamol (ROBAXIN) IV     PRN Meds:.acetaminophen, alum & mag hydroxide-simeth, bisacodyl, docusate sodium, HYDROcodone-acetaminophen, magnesium citrate, magnesium hydroxide, menthol-cetylpyridinium **OR** phenol, methocarbamol **OR** methocarbamol (ROBAXIN) IV, metoCLOPramide **OR** metoCLOPramide (REGLAN) injection, morphine injection, ondansetron **OR** ondansetron (ZOFRAN) IV Medications Prior to Admission:  Prior to Admission medications   Medication Sig Start Date End Date Taking? Authorizing Provider  acetaminophen (TYLENOL) 500 MG tablet Take 500 mg by mouth 2 (two) times daily.    Yes [provider]  aspirin EC 325 MG tablet Aspir-Trin oral 325             mg 1 tablet DAILY CVA HX 12/27/19  Yes [provider]  atorvastatin (LIPITOR) 40 MG tablet Take by mouth. 12/21/19 12/20/20 Yes [provider]  bisacodyl (DULCOLAX) 10 MG suppository Place 1 suppository (10 mg total) rectally daily. 10/25/19  Yes Ivor Costa, MD  Cholecalciferol (VITAMIN D) 50 MCG (2000 UT) tablet Take 2,000 Units by mouth daily.   Yes [provider]  citalopram (CELEXA) 20 MG tablet Take 20 mg by mouth daily.    Yes [provider]  fluticasone (FLONASE) 50 MCG/ACT nasal spray Place into the nose. 12/21/19  Yes [provider]  lactose free nutrition (BOOST) LIQD Take 237 mLs by mouth 3 (three) times daily between meals. Vanilla   Yes [provider]  lactulose (CHRONULAC) 10 GM/15ML solution Take by mouth daily as needed for mild constipation. Take 30 mLs   Yes [provider]  lisinopril (ZESTRIL) 5  MG tablet Take 1 tablet (5 mg total) by mouth daily. 07/29/19  Yes Gouru, Illene Silver, MD  melatonin 3 MG TABS tablet Take by mouth. 12/21/19  Yes [provider]  memantine (NAMENDA) 10 MG tablet Take 10 mg by mouth 2 (two) times daily.  11/05/16  Yes [provider]  ondansetron (ZOFRAN) 4 MG tablet Take 1 tablet (4 mg total) by mouth daily as needed for nausea or vomiting. 10/24/19 10/23/20 Yes Ivor Costa, MD  QUEtiapine (SEROQUEL) 25 MG tablet Take 25 mg by mouth daily.  05/10/19 05/09/20 Yes [provider]  QUEtiapine (SEROQUEL) 50 MG tablet Take 50 mg by mouth at bedtime.   Yes [provider]  traZODone (DESYREL) 50 MG tablet Take 50 mg by mouth at bedtime.   Yes [provider]  vitamin B-12 (CYANOCOBALAMIN) 1000 MCG tablet Take 1,000 mcg by mouth daily.   Yes [provider]  clonazePAM (KLONOPIN) 0.5 MG tablet Take 0.5 mg by mouth 2 (two) times daily. 04/23/20   [provider]   Allergies  Allergen Reactions  . Atenolol Other (See Comments)    Bradycardia   . Donepezil Hcl Other (See Comments)    Mental changes, "made me crazy"   Review of Systems  Unable to perform ROS   Physical Exam Constitutional:      Comments: Eyes closed.   Pulmonary:     Effort: Pulmonary effort is normal.     Vital Signs: BP 117/73 (BP Location: Right Arm)   Pulse 89   Temp 98.4 F (36.9 C) (Axillary)   Resp 15   Wt 39.9 kg   SpO2 92%   BMI 16.63 kg/m  Pain Scale: PAINAD   Pain Score: 0-No pain  SpO2: SpO2: 92 % O2 Device:SpO2: 92 % O2 Flow Rate: .O2 Flow Rate (L/min): 2 L/min  IO: Intake/output summary:   Intake/Output Summary (Last 24 hours) at 04/29/2020 1333 Last data filed at 04/29/2020 0400 Gross per 24 hour  Intake 1502.98 ml  Output -  Net 1502.98 ml    LBM: Last BM Date: 04/27/20 Baseline Weight: Weight: 39.9 kg Most recent weight: Weight: 39.9 kg     Palliative Assessment/Data:     Time In: 12:10 Time  Out: 1:20 Time Total: 70 min Greater than 50%  of this time was spent counseling and coordinating care related to the above assessment and plan.  Signed by: Asencion Gowda, NP   Please contact Palliative Medicine Team phone at [phone] for questions and concerns.  For individual provider: See Shea Evans

## 2020-04-29 NOTE — Progress Notes (Signed)
Physical Therapy Treatment Patient Details Name: Gabrielle Crosby MRN: [id number] DOB: 1933-03-23 Today's Date: 04/29/2020    History of Present Illness Per MD Note:Gabrielle Crosby is a 86 y.o. female with medical history significant for advanced dementia, hypertension, seizures and stroke who was brought into the emergency room by EMS after an unwitnessed fall in the skilled nursing facility resulting in left hip pain and bruising of the forehead and left eye.  Patient is unable to provide any history but complains of left hip pain with palpitation.  Time of fall and reason for fall is unknown since patient is a poor historian.    PT Comments    Pt found sleeping but was easily awoken with verbal and gentle tactile stimulation.  Pt was able to follow 1-step commands only occasionally and with extensive multi-modal cues.  Pt was essentially total assist for bed mobility tasks and once in sitting at the EOB initially required physical assistance to prevent posterior LOB.  After anterior weight shifting activities in sitting the pt progressed until she was able to sit with fair static sitting balance with only SBA for safety.  Pt was able to come to standing with Mod A and +2 present for safety and similar to sitting balance the pt initially required min A for stability in standing but progressed to CGA.  Pt was able to take several very small steps at the EOB when the RW was advanced for the pt with min A for stability.  Pt's SpO2 remained in the high 90s to 100% on room air with HR WNL.  Pt will benefit from a trial of PT services in a SNF setting upon discharge to safely address deficits listed in patient problem list for decreased caregiver assistance and eventual return to PLOF.    Follow Up Recommendations  SNF     Equipment Recommendations  Other (comment)(TBD at next venue of care)    Recommendations for Other Services       Precautions / Restrictions Precautions Precautions:  None Restrictions Weight Bearing Restrictions: Yes LLE Weight Bearing: Weight bearing as tolerated    Mobility  Bed Mobility Overal bed mobility: Needs Assistance Bed Mobility: Supine to Sit;Sit to Supine     Supine to sit: Total assist;+2 for physical assistance Sit to supine: Total assist;+2 for physical assistance   General bed mobility comments: +2 total assist for sup to/from sit with posterior lean once sitting at the EOB that required mod A initially to correct but progressed to SBA after anterior weight shifting activities  Transfers Overall transfer level: Needs assistance Equipment used: Rolling walker (2 wheeled) Transfers: Sit to/from Stand Sit to Stand: +2 safety/equipment;Mod assist;From elevated surface         General transfer comment: Max tactile cues for sequencing with pt able to stand with Mod A to come to standing as well as to prevent LOB upon initial stand but progressed to CGA only  Ambulation/Gait Ambulation/Gait assistance: Min assist Gait Distance (Feet): 1 Feet Assistive device: Rolling walker (2 wheeled) Gait Pattern/deviations: Step-to pattern;Decreased step length - left;Decreased stance time - right Gait velocity: decreased   General Gait Details: Pt able to take several very small shuffling steps at the EOB with min A to direct the RW and for stability   Stairs             Wheelchair Mobility    Modified Rankin (Stroke Patients Only)       Balance Overall balance assessment: Needs assistance Sitting-balance  support: No upper extremity supported;Feet supported Sitting balance-Leahy Scale: Poor     Standing balance support: Bilateral upper extremity supported;During functional activity Standing balance-Leahy Scale: Poor                              Cognition Arousal/Alertness: Lethargic Behavior During Therapy: Flat affect Overall Cognitive Status: History of cognitive impairments - at baseline                                  General Comments: Only able to follow simple one-step commands occasionally with max multi-modal cues; tearful at times but able to be redirected      Exercises Other Exercises Other Exercises: Supine BLE ankle, knee, and hip AA/PROM in available planes to patient tolerance Other Exercises: Static sitting balance training with anterior weight shifting activities to address posterior instability    General Comments        Pertinent Vitals/Pain Pain Assessment: Faces Pain Score: 5  Pain Descriptors / Indicators: Grimacing;Moaning Pain Intervention(s): Premedicated before session;Monitored during session    Home Living                      Prior Function            PT Goals (current goals can now be found in the care plan section) Progress towards PT goals: Progressing toward goals    Frequency    7X/week      PT Plan Current plan remains appropriate    Co-evaluation              AM-PAC PT "6 Clicks" Mobility   Outcome Measure  Help needed turning from your back to your side while in a flat bed without using bedrails?: Total Help needed moving from lying on your back to sitting on the side of a flat bed without using bedrails?: Total Help needed moving to and from a bed to a chair (including a wheelchair)?: A Lot Help needed standing up from a chair using your arms (e.g., wheelchair or bedside chair)?: A Lot Help needed to walk in hospital room?: Total Help needed climbing 3-5 steps with a railing? : Total 6 Click Score: 8    End of Session Equipment Utilized During Treatment: Gait belt Activity Tolerance: Patient tolerated treatment well Patient left: in bed;with call bell/phone within reach;with bed alarm set Nurse Communication: Mobility status PT Visit Diagnosis: Muscle weakness (generalized) (M62.81);Difficulty in walking, not elsewhere classified (R26.2);Pain Pain - Right/Left: Left Pain - part of body:  Leg     Time: [id number] PT Time Calculation (min) (ACUTE ONLY): 40 min  Charges:  $Therapeutic Exercise: 8-22 mins $Therapeutic Activity: 23-37 mins                     D. Scott Hubert Derstine PT, DPT 04/29/20, 5:23 PM

## 2020-04-29 NOTE — NC FL2 (Signed)
[place] LEVEL OF CARE SCREENING TOOL     IDENTIFICATION  Patient Name: Gabrielle Crosby Birthdate: 1933-11-03 Sex: female Admission Date (Current Location): 04/24/2020  [place] and [place] Number:  Engineering geologist and Address:  [facility], [address], [place], [place] [address]      Provider Number: [id number]  Attending Physician Name and Address:  Nolberto Hanlon, MD  Relative Name and Phone Number:  [name] -daughter [id number]    Current Level of Care: Hospital Recommended Level of Care: [employer] Prior Approval Number:    Date Approved/Denied:   PASRR Number: [id number]  Discharge Plan: SNF    Current Diagnoses: Patient Active Problem List   Diagnosis Date Noted  . Intertrochanteric fracture of left femur, closed, initial encounter ([place]) 04/24/2020  . Fall 04/24/2020  . Intertrochanteric fracture of left femur ([place]) 04/24/2020  . Fecal impaction ([place])   . Abnormal findings of gastrointestinal tract   . Intractable nausea and vomiting 10/20/2019  . Aspiration pneumonia ([place]) 10/20/2019  . Bilateral hydronephrosis 10/20/2019  . Elevated troponin 10/20/2019  . HLD (hyperlipidemia) 07/25/2019  . GERD (gastroesophageal reflux disease) 07/25/2019  . Facial droop 07/25/2019  . Loss of memory 07/26/2016  . Anxiety 06/26/2016  . Bradycardia 01/02/2015  . H/O deep venous thrombosis 01/02/2015  . MI (mitral incompetence) 01/02/2015  . Near syncope 01/02/2015  . Malignant neoplasm of urinary bladder ([place]) 08/22/2014  . Mixed Alzheimer's and vascular dementia ([place]) 08/22/2014  . Essential (primary) hypertension 08/22/2014  . Adaptive colitis 08/22/2014  . Lacunar infarction ([place]) 08/22/2014  . Combined fat and carbohydrate induced hyperlipemia 08/22/2014  . Allergic rhinitis, seasonal 08/22/2014    Orientation RESPIRATION BLADDER Height & Weight     Self  Normal Incontinent Weight: 39.9  kg Height:     BEHAVIORAL SYMPTOMS/MOOD NEUROLOGICAL BOWEL NUTRITION STATUS      Incontinent Diet(see discharge summary)  AMBULATORY STATUS COMMUNICATION OF NEEDS Skin   Extensive Assist Verbally Surgical wounds(left hip)                       Personal Care Assistance Level of Assistance  Bathing, Feeding, Dressing Bathing Assistance: Maximum assistance Feeding assistance: Limited assistance Dressing Assistance: Maximum assistance     Functional Limitations Info             SPECIAL CARE FACTORS FREQUENCY  PT (By licensed PT), OT (By licensed OT)     PT Frequency: 5 times per week OT Frequency: 5 times per week            Contractures Contractures Info: Not present    Additional Factors Info  Code Status, Allergies Code Status Info: DNR Allergies Info: Atenolol, donepezil           Current Medications (04/29/2020):  This is the current hospital active medication list Current Facility-Administered Medications  Medication Dose Route Frequency Provider Last Rate Last Admin  . acetaminophen (TYLENOL) tablet 325-650 mg  325-650 mg Oral Q6H PRN Hessie Knows, MD      . acetaminophen (TYLENOL) tablet 500 mg  500 mg Oral BID Hessie Knows, MD   500 mg at 04/28/20 1000  . alum & mag hydroxide-simeth (MAALOX/MYLANTA) 200-200-20 MG/5ML suspension 30 mL  30 mL Oral Q4H PRN Hessie Knows, MD      . aspirin EC tablet 325 mg  325 mg Oral Daily Hessie Knows, MD      . bisacodyl (DULCOLAX) suppository 10 mg  10 mg Rectal Daily PRN Hessie Knows, MD      . cholecalciferol (VITAMIN D) tablet 2,000 Units  2,000 Units Oral Daily Hessie Knows, MD   2,000 Units at 04/28/20 1000  . citalopram (CELEXA) tablet 20 mg  20 mg Oral Daily Hessie Knows, MD   20 mg at 04/28/20 1000  . clonazePAM (KLONOPIN) tablet 0.5 mg  0.5 mg Oral BID Hessie Knows, MD   0.5 mg at 04/27/20 2202  . dextrose 5 %-0.45 % sodium chloride infusion   Intravenous Continuous Nolberto Hanlon, MD 50 mL/hr at 04/29/20  0400 Rate Verify at 04/29/20 0400  . dextrose 5 %-0.45 % sodium chloride infusion   Intravenous Continuous Nolberto Hanlon, MD 50 mL/hr at 04/29/20 0900 New Bag at 04/29/20 0900  . docusate sodium (COLACE) capsule 100 mg  100 mg Oral BID Hessie Knows, MD      . docusate sodium (COLACE) capsule 50 mg  50 mg Oral BID PRN Hessie Knows, MD      . enoxaparin (LOVENOX) injection 30 mg  30 mg Subcutaneous Q24H Hessie Knows, MD   30 mg at 04/29/20 0901  . famotidine (PEPCID) tablet 10 mg  10 mg Oral Daily Nolberto Hanlon, MD   10 mg at 04/28/20 1026  . feeding supplement (ENSURE ENLIVE) (ENSURE ENLIVE) liquid 237 mL  237 mL Oral BID BM Nolberto Hanlon, MD   237 mL at 04/28/20 1002  . fluticasone (FLONASE) 50 MCG/ACT nasal spray 1 spray  1 spray Each Nare Daily Hessie Knows, MD   1 spray at 04/26/20 1402  . HYDROcodone-acetaminophen (NORCO/VICODIN) 5-325 MG per tablet 1-2 tablet  1-2 tablet Oral Q4H PRN Hessie Knows, MD      . magnesium citrate solution 1 Bottle  1 Bottle Oral Once PRN Hessie Knows, MD      . magnesium hydroxide (MILK OF MAGNESIA) suspension 30 mL  30 mL Oral Daily PRN Hessie Knows, MD      . melatonin tablet 5 mg  5 mg Oral QHS Hessie Knows, MD   5 mg at 04/27/20 2203  . memantine (NAMENDA) tablet 10 mg  10 mg Oral BID Hessie Knows, MD   10 mg at 04/28/20 1001  . menthol-cetylpyridinium (CEPACOL) lozenge 3 mg  1 lozenge Oral PRN Hessie Knows, MD       Or  . phenol (CHLORASEPTIC) mouth spray 1 spray  1 spray Mouth/Throat PRN Hessie Knows, MD      . methocarbamol (ROBAXIN) tablet 500 mg  500 mg Oral Q6H PRN Hessie Knows, MD       Or  . methocarbamol (ROBAXIN) 500 mg in dextrose 5 % 50 mL IVPB  500 mg Intravenous Q6H PRN Hessie Knows, MD      . metoCLOPramide (REGLAN) tablet 5 mg  5 mg Oral Q8H PRN Hessie Knows, MD       Or  . metoCLOPramide (REGLAN) injection 5 mg  5 mg Intravenous Q8H PRN Hessie Knows, MD      . morphine 2 MG/ML injection 0.5-1 mg  0.5-1 mg Intravenous Q2H PRN  Hessie Knows, MD   1 mg at 04/29/20 0053  . multivitamin with minerals tablet 1 tablet  1 tablet Oral Daily Hessie Knows, MD   1 tablet at 04/28/20 1000  . ondansetron (ZOFRAN) tablet 4 mg  4 mg Oral Q6H PRN Hessie Knows, MD       Or  . ondansetron [facility]) injection 4 mg  4 mg Intravenous Q6H PRN Hessie Knows, MD      .  polyethylene glycol (MIRALAX / GLYCOLAX) packet 17 g  17 g Oral Daily Hessie Knows, MD   17 g at 04/24/20 1507  . potassium chloride 10 mEq in 100 mL IVPB  10 mEq Intravenous Q1 Hr x 4 Amery, Sahar, MD 100 mL/hr at 04/29/20 0901 10 mEq at 04/29/20 0901  . pravastatin (PRAVACHOL) tablet 20 mg  20 mg Oral q1800 Hessie Knows, MD   20 mg at 04/28/20 1702  . QUEtiapine (SEROQUEL) tablet 25 mg  25 mg Oral q morning - 10a Hessie Knows, MD   25 mg at 04/28/20 1026  . QUEtiapine (SEROQUEL) tablet 50 mg  50 mg Oral QHS Hessie Knows, MD   50 mg at 04/27/20 2203  . traMADol (ULTRAM) tablet 50 mg  50 mg Oral Q6H Hessie Knows, MD   50 mg at 04/28/20 1702  . traZODone (DESYREL) tablet 50 mg  50 mg Oral QHS Hessie Knows, MD   50 mg at 04/27/20 2203  . vitamin B-12 (CYANOCOBALAMIN) tablet 1,000 mcg  1,000 mcg Oral Daily Hessie Knows, MD   1,000 mcg at 04/28/20 1000     Discharge Medications: Please see discharge summary for a list of discharge medications.  Relevant Imaging Results:  Relevant Lab Results:   Additional Information SSN [id number]  Shelbie Hutching, RN

## 2020-04-29 NOTE — Care Management Important Message (Signed)
Important Message  Patient Details  Name: Gabrielle Crosby MRN: [id number] Date of Birth: 1933/04/12   Medicare Important Message Given:  Other (see comment)  Left message with daughter encouraging callback to review Medicare IM.    [name] 04/29/2020, 1:14 PM

## 2020-04-29 NOTE — TOC Progression Note (Signed)
Transition of Care [facility]) - Progression Note    Patient Details  Name: Gabrielle Crosby MRN: [id number] Date of Birth: [birth year]  Transition of Care [facility]) CM/SW Contact  Shelbie Hutching, RN Phone Number: 04/29/2020, 9:10 AM  Clinical Narrative:     PT has recommended SNF.  RNCM has started bed search for nursing facility that will accept COVID patient's.    Expected Discharge Plan: [place] Barriers to Discharge: Continued Medical Work up  Expected Discharge Plan and Services Expected Discharge Plan: [place] arrangements for the past 2 months: [place]                                       Social Determinants of Health (SDOH) Interventions    Readmission Risk Interventions No flowsheet data found.

## 2020-04-30 LAB — MAGNESIUM: Magnesium: 1.7 mg/dL (ref 1.7–2.4)

## 2020-04-30 LAB — POTASSIUM: Potassium: 3.6 mmol/L (ref 3.5–5.1)

## 2020-04-30 MED ORDER — MAGNESIUM SULFATE 2 GM/50ML IV SOLN
2.0000 g | Freq: Once | INTRAVENOUS | Status: AC
  Administered 2020-04-30: 15:00:00 2 g via INTRAVENOUS
  Filled 2020-04-30: qty 50

## 2020-04-30 NOTE — TOC Progression Note (Signed)
Transition of Care [facility]) - Progression Note    Patient Details  Name: OLIWIA KOSIN MRN: [id number] Date of Birth: 01-Feb-1933  Transition of Care [facility]) CM/SW Contact  Shelbie Hutching, RN Phone Number: 04/30/2020, 12:20 PM  Clinical Narrative:    Patient's daughter is not sure what she wants to do as far as patient discharge goes.  Daughter was considering home with hospice but she wanted to see if the patient was still positive for COVID.  Epic will not allow the MD to place a new COVID order because she has already tested positive.  RNCM explained this to the daughter.  Options for discharge are home with hospice or skilled nursing facility for rehab short term.  Daughter has been talking with [facility].  This RNCM did speak with Melissa Bean of [facility] about trying to get the patient home with hospice.  Offered the number for patient experience so the daughter could call and report her concerns about the COVID testing.  Daughter will call back when she has made a decision about discharge.    Expected Discharge Plan: [place] Barriers to Discharge: Continued Medical Work up  Expected Discharge Plan and Services Expected Discharge Plan: [place] arrangements for the past 2 months: [place]                                       Social Determinants of Health (SDOH) Interventions    Readmission Risk Interventions No flowsheet data found.

## 2020-04-30 NOTE — Progress Notes (Signed)
Nightly update given to daughter [name], aware that pt continues to have pain and is receiving PRN IV med, not safe to give pt PO meds at this time related to mental status and aspiration risk, poor PO intake, daughter acknowledged this, states understanding

## 2020-04-30 NOTE — Progress Notes (Signed)
Physical Therapy Treatment Patient Details Name: Gabrielle Crosby MRN: [id number] DOB: 1933/09/12 Today's Date: 04/30/2020    History of Present Illness Per MD Note:Gabrielle Crosby is a 86 y.o. female with medical history significant for advanced dementia, hypertension, seizures and stroke who was brought into the emergency room by EMS after an unwitnessed fall in the skilled nursing facility resulting in left hip pain and bruising of the forehead and left eye.  Patient is unable to provide any history but complains of left hip pain with palpitation.  Time of fall and reason for fall is unknown since patient is a poor historian.    PT Comments    Pt made some progress during session this date most notably with static sitting balance and ambulation.  Pt continued to require total assist with bed mobility tasks but once in sitting only required initial min A for stability but quickly progressed to SBA.  Pt was able to ambulate forwards and backwards at the EOB with grossly improved gait quality and stability but continued to require occasional min A to prevent LOB and to guide the RW.  Pt will benefit from a trial of PT services in a SNF setting upon discharge to safely address deficits listed in patient problem list for decreased caregiver assistance and eventual return to PLOF.   Follow Up Recommendations  SNF     Equipment Recommendations  Other (comment)(TBD at next venue of care)    Recommendations for Other Services       Precautions / Restrictions Precautions Precautions: Fall Restrictions Weight Bearing Restrictions: Yes LLE Weight Bearing: Weight bearing as tolerated    Mobility  Bed Mobility Overal bed mobility: Needs Assistance Bed Mobility: Supine to Sit;Sit to Supine     Supine to sit: Total assist Sit to supine: Total assist   General bed mobility comments: Total assist for sup to/from sit with decreased posterior lean once sitting at the EOB this  session  Transfers Overall transfer level: Needs assistance Equipment used: Rolling walker (2 wheeled) Transfers: Sit to/from Stand Sit to Stand: +2 safety/equipment;Mod assist;From elevated surface         General transfer comment: Max tactile cues for sequencing with pt able to stand with Mod A to come to standing as well as to prevent LOB upon initial stand but progressed to CGA only  Ambulation/Gait Ambulation/Gait assistance: Min assist Gait Distance (Feet): 3 Feet x 2 Assistive device: Rolling walker (2 wheeled) Gait Pattern/deviations: Step-to pattern;Decreased step length - left;Decreased stance time - right Gait velocity: decreased   General Gait Details: Pt able to take several very small shuffling steps forwards and backwards at the EOB with min A to direct the RW and for stability   Stairs             Wheelchair Mobility    Modified Rankin (Stroke Patients Only)       Balance Overall balance assessment: Needs assistance Sitting-balance support: No upper extremity supported;Feet supported Sitting balance-Leahy Scale: Poor Sitting balance - Comments: Min posterior instability with static sitting but improved from prior session   Standing balance support: Bilateral upper extremity supported;During functional activity Standing balance-Leahy Scale: Poor Standing balance comment: Min A for stability in standing                            Cognition Arousal/Alertness: Lethargic Behavior During Therapy: Flat affect Overall Cognitive Status: History of cognitive impairments - at baseline  Exercises Other Exercises Other Exercises: Supine BLE ankle, knee, and hip AA/PROM in available planes to patient tolerance    General Comments        Pertinent Vitals/Pain Pain Assessment: Faces Pain Score: 5  Pain Descriptors / Indicators: Grimacing;Moaning Pain Intervention(s): Premedicated  before session;Monitored during session    Home Living                      Prior Function            PT Goals (current goals can now be found in the care plan section) Progress towards PT goals: Progressing toward goals    Frequency    7X/week      PT Plan Current plan remains appropriate    Co-evaluation              AM-PAC PT "6 Clicks" Mobility   Outcome Measure  Help needed turning from your back to your side while in a flat bed without using bedrails?: Total Help needed moving from lying on your back to sitting on the side of a flat bed without using bedrails?: Total Help needed moving to and from a bed to a chair (including a wheelchair)?: A Lot Help needed standing up from a chair using your arms (e.g., wheelchair or bedside chair)?: A Lot Help needed to walk in hospital room?: Total Help needed climbing 3-5 steps with a railing? : Total 6 Click Score: 8    End of Session Equipment Utilized During Treatment: Gait belt Activity Tolerance: Patient tolerated treatment well Patient left: in bed;with call bell/phone within reach;with bed alarm set Nurse Communication: Mobility status PT Visit Diagnosis: Muscle weakness (generalized) (M62.81);Difficulty in walking, not elsewhere classified (R26.2);Pain Pain - Right/Left: Left Pain - part of body: Leg     Time: [id number] PT Time Calculation (min) (ACUTE ONLY): 38 min  Charges:  $Gait Training: 8-22 mins $Therapeutic Exercise: 8-22 mins $Therapeutic Activity: 8-22 mins                     D. Scott Orlando Devereux PT, DPT 04/30/20, 4:24 PM

## 2020-04-30 NOTE — Progress Notes (Signed)
PROGRESS NOTE    CERRA MAROTTA  [id number] DOB: 1933-06-23 DOA: 04/24/2020 PCP: Toni Arthurs, NP    Brief Narrative:  JENEFFER LEINEN is a 86 y.o. female with medical history significant for advanced dementia, hypertension, seizures and stroke who was brought into the emergency room by EMS after an unwitnessed fall in the skilled nursing facility resulting in left hip pain and bruising of the forehead and left eye.  Patient is unable to provide any history but complains of left hip pain with palpitation.  Time of fall and reason for fall is unknown since patient is a poor historian. Patient's COVID 19 test is positive but per daughter she has completed her vaccination series   Consultants:   Orthopedics  Procedures:   Antimicrobials: Left  Cefazolin x1   Subjective: Lying in bed, not interactive. Eyes closed   objective: Vitals:   04/30/20 0221 04/30/20 0355 04/30/20 0400 04/30/20 0505  BP:   (!) 122/54   Pulse: 79 72 79 80  Resp: 15 16 18 14   Temp:      TempSrc:      SpO2: 99% 100% 100% 100%  Weight:      Height:        Intake/Output Summary (Last 24 hours) at 04/30/2020 1254 Last data filed at 04/29/2020 1500 Gross per 24 hour  Intake 536.4 ml  Output --  Net 536.4 ml   Filed Weights   04/24/20 0818  Weight: 39.9 kg    Examination:  General exam: eyes closed, NAD Respiratory system: with respiratory effort anteriorly clear to auscultation, no wheeze rales rhonchi  Cardiovascular system: S1 & S2 heard, RRR. No JVD, murmurs, rubs, gallops or clicks.  Gastrointestinal system: Abdomen is nondistended, soft and nontender.  Normal bowel sounds heard. Central nervous system: Unable to assess Extremities: No edema Skin: Warm and dry Psychiatry: Unable to assess    Data Reviewed: I have personally reviewed following labs and imaging studies  CBC: Recent Labs  Lab 04/24/20 0822 04/24/20 0822 04/25/20 0519 04/26/20 0534 04/27/20 0554  04/28/20 1259 04/29/20 0552  WBC 6.7   < > 9.1 13.2* 9.3 5.3 4.5  NEUTROABS 4.9  --   --   --   --   --   --   HGB 11.1*   < > 9.8* 7.8* 7.3* 6.7* 8.3*  HCT 35.4*   < > 29.9* 24.9* 23.6* 21.2* 26.0*  MCV 96.2   < > 95.2 97.6 100.4* 97.7 94.5  PLT 269   < > 209 172 198 142* 187   < > = values in this interval not displayed.   Basic Metabolic Panel: Recent Labs  Lab 04/24/20 0822 04/25/20 0519 04/26/20 0534 04/29/20 0552 04/30/20 0635  NA 142 141 144 148*  --   K 3.9 3.7 4.3 3.2* 3.6  CL 107 108 111 114*  --   CO2 29 26 28 27   --   GLUCOSE 128* 110* 122* 97  --   BUN 15 21 27* 16  --   CREATININE 0.46 0.49 0.58 0.49  --   CALCIUM 8.5* 8.5* 8.4* 8.2*  --   MG  --   --   --   --  1.7   GFR: Estimated Creatinine Clearance: 33 mL/min (by C-G formula based on SCr of 0.49 mg/dL). Liver Function Tests: Recent Labs  Lab 04/24/20 0822  AST 50*  ALT 27  ALKPHOS 120  BILITOT 0.8  PROT 7.0  ALBUMIN 3.3*   No  results for input(s): LIPASE, AMYLASE in the last 168 hours. No results for input(s): AMMONIA in the last 168 hours. Coagulation Profile: No results for input(s): INR, PROTIME in the last 168 hours. Cardiac Enzymes: No results for input(s): CKTOTAL, CKMB, CKMBINDEX, TROPONINI in the last 168 hours. BNP (last 3 results) No results for input(s): PROBNP in the last 8760 hours. HbA1C: No results for input(s): HGBA1C in the last 72 hours. CBG: No results for input(s): GLUCAP in the last 168 hours. Lipid Profile: No results for input(s): CHOL, HDL, LDLCALC, TRIG, CHOLHDL, LDLDIRECT in the last 72 hours. Thyroid Function Tests: No results for input(s): TSH, T4TOTAL, FREET4, T3FREE, THYROIDAB in the last 72 hours. Anemia Panel: No results for input(s): VITAMINB12, FOLATE, FERRITIN, TIBC, IRON, RETICCTPCT in the last 72 hours. Sepsis Labs: Recent Labs  Lab 04/26/20 1556 04/26/20 1826  LATICACIDVEN 1.1 1.2    Recent Results (from the past 240 hour(s))  SARS  Coronavirus 2 by RT PCR (hospital order, performed in [facility] hospital lab) Nasopharyngeal Urine, Catheterized     Status: Abnormal   Collection Time: 04/24/20  8:44 AM   Specimen: Urine, Catheterized; Nasopharyngeal  Result Value Ref Range Status   SARS Coronavirus 2 POSITIVE (A) NEGATIVE Final    Comment: CRITICAL RESULT CALLED TO, READ BACK BY AND VERIFIED WITH: ALLY YOW AT [id number] ON 04/24/2020 [place]. (NOTE) SARS-CoV-2 target nucleic acids are DETECTED SARS-CoV-2 RNA is generally detectable in upper respiratory specimens  during the acute phase of infection.  Positive results are indicative  of the presence of the identified virus, but do not rule out bacterial infection or co-infection with other pathogens not detected by the test.  Clinical correlation with patient history and  other diagnostic information is necessary to determine patient infection status.  The expected result is negative. Fact Sheet for Patients:   StrictlyIdeas.no  Fact Sheet for Healthcare Providers:   BankingDealers.co.za   This test is not yet approved or cleared by the [place] FDA and  has been authorized for detection and/or diagnosis of SARS-CoV-2 by FDA under an Emergency Use Authorization (EUA).  This EUA will remain in effect (meaning this t est can be used) for the duration of  the COVID-19 declaration under Section 564(b)(1) of the Act, 21 U.S.C. section 360-bbb-3(b)(1), unless the authorization is terminated or revoked sooner. Performed at [facility], [address] [place], [place] [address]   MRSA PCR Screening     Status: None   Collection Time: 04/25/20 10:48 AM   Specimen: Nasopharyngeal  Result Value Ref Range Status   MRSA by PCR NEGATIVE NEGATIVE Final    Comment:        The GeneXpert MRSA Assay (FDA approved for NASAL specimens only), is one component of a comprehensive MRSA colonization surveillance program. It is  not intended to diagnose MRSA infection nor to guide or monitor treatment for MRSA infections. Performed at [facility], [address], [place], [place] [address]          Radiology Studies: No results found.      Scheduled Meds: . acetaminophen  500 mg Oral BID  . aspirin EC  325 mg Oral Daily  . cholecalciferol  2,000 Units Oral Daily  . citalopram  20 mg Oral Daily  . clonazePAM  0.5 mg Oral BID  . docusate sodium  100 mg Oral BID  . enoxaparin (LOVENOX) injection  30 mg Subcutaneous Q24H  . famotidine  10 mg Oral Daily  .  feeding supplement (ENSURE ENLIVE)  237 mL Oral BID BM  . fluticasone  1 spray Each Nare Daily  . melatonin  5 mg Oral QHS  . memantine  10 mg Oral BID  . multivitamin with minerals  1 tablet Oral Daily  . polyethylene glycol  17 g Oral Daily  . pravastatin  20 mg Oral q1800  . QUEtiapine  25 mg Oral q morning - 10a  . QUEtiapine  50 mg Oral QHS  . traMADol  50 mg Oral Q6H  . traZODone  50 mg Oral QHS  . vitamin B-12  1,000 mcg Oral Daily   Continuous Infusions: . dextrose 5 % and 0.45% NaCl 50 mL/hr at 04/30/20 0329  . methocarbamol (ROBAXIN) IV      Assessment & Plan:   Principal Problem:   Intertrochanteric fracture of left femur, closed, initial encounter [facility]) Active Problems:   Mixed Alzheimer's and vascular dementia ([place])   Essential (primary) hypertension   Fall   Intertrochanteric fracture of left femur ([place])   Status post fall Ortho consulted.  status post fall with intertrochanteric fracture of left femur-s/p Repair   fall precautions Pain control Remove staples and apply steri strips on 05/09/2020 Follow up with [facility] ortho in 6 weeks TED hose BLE x 6 weeks Lovenox 40mg  SQ daily x 14 days at discharge  Postop acute drop h/h- 2/2 surgery. Status post 1 unit packed red blood cells transfusion on 04/28/2020.  Hemoglobin 8.3 this AM. Will transfuse if Hg <7.  Nose bleed- stopped when pt rolled to her side on  04/28/2020. Continue to hold aspirin for now Continue with post op lovenox Possibly nose bleed from 0xygen. Discussed with nsg. H&H stable this a.m. posttransfusion  Mixed Alzheimer's and vascular dementia Continue Citalopram, Seroquel, Trazodone and Memantine palliative care input was appreciated. See note plz.   Hypertension BP on low side.  Likely due to acute blood loss from surgery.  Also decreased p.o. intake. DC'd lisinopril for now bp improved with ivf Encouraged nsg to have tech help with feeding /ensure  COvid + test- no respiratory sx.  cxr negatrive. CTA chest no PE, atelectasis and small amount of left pleural effusion consulted palliative care pending  Hypokalemia-mild Replaced, now 3.6 Mg 1.7 , will give mg iv x1 today Monitor level   DVT prophylaxis: SCD Code Status: DNR Family Communication: Tried calling daughter, went to VM Disposition Plan: needs snf v.s. home with hospice , but daughter wants another covid test as she doesn't think mother has covid. But did tell daughter we are unable to perfom another test just for that.  Barrier: needs snf placement versus  home with hospice. Need safe dc planning, likely 1-2 more days   LOS: 6 days   Time spent: 45 minutes with more than 50% on [place], MD [facility] Pager [phone]  If 7PM-7AM, please contact night-coverage [email or web address] Password [facility] 04/30/2020, 12:54 PM Patient ID: LAURIEN WANDER, female   DOB: [birth year], 86 y.o.   MRN: [id number]

## 2020-05-01 DIAGNOSIS — I10 Essential (primary) hypertension: Secondary | ICD-10-CM

## 2020-05-01 LAB — BASIC METABOLIC PANEL
Anion gap: 8 (ref 5–15)
BUN: 8 mg/dL (ref 8–23)
CO2: 23 mmol/L (ref 22–32)
Calcium: 7.7 mg/dL — ABNORMAL LOW (ref 8.9–10.3)
Chloride: 107 mmol/L (ref 98–111)
Creatinine, Ser: 0.32 mg/dL — ABNORMAL LOW (ref 0.44–1.00)
GFR calc Af Amer: >60 mL/min (ref >60)
GFR calc non Af Amer: >60 mL/min (ref >60)
Glucose, Bld: 101 mg/dL — ABNORMAL HIGH (ref 70–99)
Potassium: 3.2 mmol/L — ABNORMAL LOW (ref 3.5–5.1)
Sodium: 138 mmol/L (ref 135–145)

## 2020-05-01 LAB — CBC
HCT: 24.1 % — ABNORMAL LOW (ref 36.0–46.0)
Hemoglobin: 7.9 g/dL — ABNORMAL LOW (ref 12.0–15.0)
MCH: 29.7 pg (ref 26.0–34.0)
MCHC: 32.8 g/dL (ref 30.0–36.0)
MCV: 90.6 fL (ref 80.0–100.0)
Platelets: 233 10*3/uL (ref 150–400)
RBC: 2.66 MIL/uL — ABNORMAL LOW (ref 3.87–5.11)
RDW: 16.8 % — ABNORMAL HIGH (ref 11.5–15.5)
WBC: 7.1 10*3/uL (ref 4.0–10.5)
nRBC: 0 % (ref 0.0–0.2)

## 2020-05-01 LAB — SARS CORONAVIRUS 2 (TAT 6-24 HRS): SARS Coronavirus 2: NEGATIVE

## 2020-05-01 LAB — MAGNESIUM: Magnesium: 1.9 mg/dL (ref 1.7–2.4)

## 2020-05-01 MED ORDER — TRAZODONE HCL 50 MG PO TABS: 50.0000 mg | ORAL_TABLET | Freq: Every evening | ORAL | Status: DC | PRN

## 2020-05-01 MED ORDER — MELATONIN 5 MG PO TABS
5.0000 mg | ORAL_TABLET | Freq: Every evening | ORAL | Status: DC | PRN
  Filled 2020-05-01: qty 1

## 2020-05-01 MED ORDER — POTASSIUM CHLORIDE CRYS ER 20 MEQ PO TBCR
40.0000 meq | EXTENDED_RELEASE_TABLET | ORAL | Status: AC
  Administered 2020-05-01 (×2): 40 meq via ORAL
  Filled 2020-05-01 (×2): qty 2

## 2020-05-01 NOTE — Care Management Important Message (Signed)
Important Message  Patient Details  Name: Gabrielle Crosby MRN: [id number] Date of Birth: February 25, 1933   Medicare Important Message Given:  Yes  Reviewed verbally with daughter, [name], at [phone].  Copy of Medicare IM sent securely to email address provided: [email or web address].     [name] 05/01/2020, 11:24 AM

## 2020-05-01 NOTE — Progress Notes (Addendum)
PROGRESS NOTE    Gabrielle Crosby  [id number] DOB: 06-Oct-1933 DOA: 04/24/2020 PCP: Gabrielle Arthurs, NP     Brief Narrative: Gabrielle Broxterman Faucetteis a 86 y.o.femalewith medical history significant foradvanced dementia,hypertension, seizures and stroke who was brought into the emergency room by EMS after an unwitnessed fall in the skilled nursing facility resulting in left hip pain.  She had a surgery performed by Dr. Rudene Christians on 5/27 with a left hip intramedullary nail.   Patient's COVID 19 test is positive but per daughter she has completed her vaccination series   Assessment & Plan:   Principal Problem:   Intertrochanteric fracture of left femur, closed, initial encounter [facility]) Active Problems:   Mixed Alzheimer's and vascular dementia ([place])   Essential (primary) hypertension   Fall   Intertrochanteric fracture of left femur ([place])  #1.  Left hip fracture.  Status post left hip surgery. Patient doing well.  Pain under control.  2.  Dementia secondary to vascular dementia and Alzheimer disease. Patient still has significant confusion.  Continue home medicines.  3.  Essential hypertension. Patient had a hypotension yesterday, lisinopril on hold.  Continue to follow.  4.  Covid infection. No hypoxemia.  Continue to follow.  5.  Anemia.  Blood loss anemia and chronic anemia. Check iron, B12 level.  6.  Hypokalemia. Supplement.   DVT prophylaxis: Lovenox Code Status:DNR Family Communication: None Disposition Plan:  . Patient came from:            . Anticipated d/c place: ECF vs home . Barriers to d/c OR conditions which need to be met to effect a safe d/c: Per family request, patient will be discharged home with home hospice if repeated Covid test negative.  She will be transferred to nursing home tomorrow if Covid still positive.  Consultants:   Orthopedics  Procedures: Hip surgery. Antimicrobials: None  Subjective: Patient doing well.  Has some confusion, but  denies any pain. No short of breath or cough. No abdominal pain nausea vomiting or diarrhea. No dysuria hematuria.  Objective: Vitals:   05/01/20 0431 05/01/20 0447 05/01/20 0537 05/01/20 0800  BP:    133/67  Pulse:  80 84 88  Resp: '12 16 20 15  ' Temp:      TempSrc:      SpO2:  97% 97% 99%  Weight:      Height:        Intake/Output Summary (Last 24 hours) at 05/01/2020 1347 Last data filed at 05/01/2020 0020 Gross per 24 hour  Intake 939.75 ml  Output --  Net 939.75 ml   Filed Weights   04/24/20 0818  Weight: 39.9 kg    Examination:  General exam: Appears calm and comfortable  Respiratory system: Clear to auscultation. Respiratory effort normal. Cardiovascular system: S1 & S2 heard, RRR. No JVD, murmurs, rubs, gallops or clicks. No pedal edema. Gastrointestinal system: Abdomen is nondistended, soft and nontender. No organomegaly or masses felt. Normal bowel sounds heard. Central nervous system: Alert and oriented x2. No focal neurological deficits. Extremities: Symmetric  Skin: No rashes, lesions or ulcers Psychiatry: Confused.    Data Reviewed: I have personally reviewed following labs and imaging studies  CBC: Recent Labs  Lab 04/26/20 0534 04/27/20 0554 04/28/20 1259 04/29/20 0552 05/01/20 0638  WBC 13.2* 9.3 5.3 4.5 7.1  HGB 7.8* 7.3* 6.7* 8.3* 7.9*  HCT 24.9* 23.6* 21.2* 26.0* 24.1*  MCV 97.6 100.4* 97.7 94.5 90.6  PLT 172 198 142* 187 948   Basic Metabolic Panel:  Recent Labs  Lab 04/25/20 0519 04/26/20 0534 04/29/20 0552 04/30/20 0635 05/01/20 0638  NA 141 144 148*  --  138  K 3.7 4.3 3.2* 3.6 3.2*  CL 108 111 114*  --  107  CO2 '26 28 27  ' --  23  GLUCOSE 110* 122* 97  --  101*  BUN 21 27* 16  --  8  CREATININE 0.49 0.58 0.49  --  0.32*  CALCIUM 8.5* 8.4* 8.2*  --  7.7*  MG  --   --   --  1.7 1.9   GFR: Estimated Creatinine Clearance: 33 mL/min (A) (by C-G formula based on SCr of 0.32 mg/dL (L)). Liver Function Tests: No results for  input(s): AST, ALT, ALKPHOS, BILITOT, PROT, ALBUMIN in the last 168 hours. No results for input(s): LIPASE, AMYLASE in the last 168 hours. No results for input(s): AMMONIA in the last 168 hours. Coagulation Profile: No results for input(s): INR, PROTIME in the last 168 hours. Cardiac Enzymes: No results for input(s): CKTOTAL, CKMB, CKMBINDEX, TROPONINI in the last 168 hours. BNP (last 3 results) No results for input(s): PROBNP in the last 8760 hours. HbA1C: No results for input(s): HGBA1C in the last 72 hours. CBG: No results for input(s): GLUCAP in the last 168 hours. Lipid Profile: No results for input(s): CHOL, HDL, LDLCALC, TRIG, CHOLHDL, LDLDIRECT in the last 72 hours. Thyroid Function Tests: No results for input(s): TSH, T4TOTAL, FREET4, T3FREE, THYROIDAB in the last 72 hours. Anemia Panel: No results for input(s): VITAMINB12, FOLATE, FERRITIN, TIBC, IRON, RETICCTPCT in the last 72 hours. Sepsis Labs: Recent Labs  Lab 04/26/20 1556 04/26/20 1826  LATICACIDVEN 1.1 1.2    Recent Results (from the past 240 hour(s))  SARS Coronavirus 2 by RT PCR (hospital order, performed in [facility] hospital lab) Nasopharyngeal Urine, Catheterized     Status: Abnormal   Collection Time: 04/24/20  8:44 AM   Specimen: Urine, Catheterized; Nasopharyngeal  Result Value Ref Range Status   SARS Coronavirus 2 POSITIVE (A) NEGATIVE Final    Comment: CRITICAL RESULT CALLED TO, READ BACK BY AND VERIFIED WITH: ALLY YOW AT 9678 ON 04/24/2020 [place]. (NOTE) SARS-CoV-2 target nucleic acids are DETECTED SARS-CoV-2 RNA is generally detectable in upper respiratory specimens  during the acute phase of infection.  Positive results are indicative  of the presence of the identified virus, but do not rule out bacterial infection or co-infection with other pathogens not detected by the test.  Clinical correlation with patient history and  other diagnostic information is necessary to determine patient infection  status.  The expected result is negative. Fact Sheet for Patients:   StrictlyIdeas.no  Fact Sheet for Healthcare Providers:   BankingDealers.co.za   This test is not yet approved or cleared by the [place] FDA and  has been authorized for detection and/or diagnosis of SARS-CoV-2 by FDA under an Emergency Use Authorization (EUA).  This EUA will remain in effect (meaning this t est can be used) for the duration of  the COVID-19 declaration under Section 564(b)(1) of the Act, 21 U.S.C. section 360-bbb-3(b)(1), unless the authorization is terminated or revoked sooner. Performed at [facility], [address] [place], [place] [address]   MRSA PCR Screening     Status: None   Collection Time: 04/25/20 10:48 AM   Specimen: Nasopharyngeal  Result Value Ref Range Status   MRSA by PCR NEGATIVE NEGATIVE Final    Comment:        The GeneXpert MRSA Assay (  FDA approved for NASAL specimens only), is one component of a comprehensive MRSA colonization surveillance program. It is not intended to diagnose MRSA infection nor to guide or monitor treatment for MRSA infections. Performed at [facility], [address], [place], [place] [address]          Radiology Studies: No results found.      Scheduled Meds: . acetaminophen  500 mg Oral BID  . aspirin EC  325 mg Oral Daily  . cholecalciferol  2,000 Units Oral Daily  . citalopram  20 mg Oral Daily  . clonazePAM  0.5 mg Oral BID  . docusate sodium  100 mg Oral BID  . enoxaparin (LOVENOX) injection  30 mg Subcutaneous Q24H  . famotidine  10 mg Oral Daily  . feeding supplement (ENSURE ENLIVE)  237 mL Oral BID BM  . fluticasone  1 spray Each Nare Daily  . melatonin  5 mg Oral QHS  . memantine  10 mg Oral BID  . multivitamin with minerals  1 tablet Oral Daily  . polyethylene glycol  17 g Oral Daily  . potassium chloride  40 mEq Oral Q4H  . pravastatin  20 mg  Oral q1800  . QUEtiapine  25 mg Oral q morning - 10a  . QUEtiapine  50 mg Oral QHS  . traMADol  50 mg Oral Q6H  . traZODone  50 mg Oral QHS  . vitamin B-12  1,000 mcg Oral Daily   Continuous Infusions: . dextrose 5 % and 0.45% NaCl 50 mL/hr at 05/01/20 0024  . methocarbamol (ROBAXIN) IV       LOS: 7 days    Time spent: 25 minutes    Sharen Hones, MD [facility]   To contact the attending provider between 7A-7P or the covering provider during after hours 7P-7A, please log into the web site [email or web address] and access using universal [place] password for that web site. If you do not have the password, please call the hospital operator.  05/01/2020, 1:47 PM

## 2020-05-01 NOTE — Progress Notes (Addendum)
NP Sharion Settler made aware of BP 98/41 (58), also made aware that pt on several nighttime meds that need to be reviewed ex klonopin, trazadone, seroquel, melatonin, pain meds etc, concerned that it is too much at once and that they may need to be changed to PRN instead, pt currently asleep, arouses when performing pt care, NP states that she will review meds

## 2020-05-01 NOTE — Progress Notes (Signed)
Pt alert, more talkative tonight, able to answer questions, able to take PO meds, ate 1 magic cup without any difficulties, pt holding leg and complained of some left leg pain, pt voicing needs

## 2020-05-01 NOTE — Progress Notes (Signed)
Nightly update given to [facility]

## 2020-05-01 NOTE — Plan of Care (Signed)
PMT note:  Patient is resting in bed. Spoke with daughter. She would like another covid test to take mother home with hospice. Waiting to see if that is possible. Will follow.

## 2020-05-01 NOTE — TOC Progression Note (Signed)
Transition of Care [facility]) - Progression Note    Patient Details  Name: Gabrielle Crosby MRN: [id number] Date of Birth: 05/23/1933  Transition of [facility]) CM/SW Contact  Shelbie Hutching, RN Phone Number: 05/01/2020, 1:57 PM  Clinical Narrative:    MD today agreed to order another COVID test.  Test has been collected awaiting results.  This RNCM has spoken with the daughter and she is very appreciative that the doctor was able to repeat the test.  Result may not come back until tomorrow.  Daughter really wants to bring the patient home with hospice but can't if the patient is positive.    Expected Discharge Plan: [place] Barriers to Discharge: Continued Medical Work up  Expected Discharge Plan and Services Expected Discharge Plan: [place] arrangements for the past 2 months: [place]                                       Social Determinants of Health (SDOH) Interventions    Readmission Risk Interventions No flowsheet data found.

## 2020-05-01 NOTE — Progress Notes (Signed)
Physical Therapy Treatment Patient Details Name: Gabrielle Crosby MRN: [id number] DOB: [birth year] Today's Date: 05/01/2020    History of Present Illness Per MD Note:Gabrielle Crosby is a 86 y.o. female with medical history significant for advanced dementia, hypertension, seizures and stroke who was brought into the emergency room by EMS after an unwitnessed fall in the skilled nursing facility resulting in left hip pain and bruising of the forehead and left eye.  Patient is unable to provide any history but complains of left hip pain with palpitation.  Time of fall and reason for fall is unknown since patient is a poor historian.    PT Comments    Pt was asleep in supine upon arriving. Breakfast tray still at bedside untouched. Pt easily awakes and is cooperative throughout. Pt has cognition deficits at baseline but was able to follow simple 1 step commands with increased time and tactile cues for initiation of movements. Total assist to sit EOB and return to supine after OOB activity. She was able to stand from elevated EOB height with mod assist of one with 2nd person for safety. Took 4 steps fwd and backwards with RW prior to side stepping to [facility] via mod assist. Pt very fatigued from minimal activity. Total assist to return to supine. Falls asleep quickly. PT continues to  recommend DC to SNF to address deficits and improve safe functional mobility.   Follow Up Recommendations  SNF     Equipment Recommendations  Other (comment)(defer to next level of care)    Recommendations for Other Services       Precautions / Restrictions Precautions Precautions: Fall Restrictions Weight Bearing Restrictions: Yes LLE Weight Bearing: Weight bearing as tolerated    Mobility  Bed Mobility Overal bed mobility: Needs Assistance Bed Mobility: Supine to Sit;Sit to Supine     Supine to sit: Total assist Sit to supine: Total assist   General bed mobility comments: Pt unable to initiate movements  without total assist. assisted BLEs and trunk to EOB.  Transfers Overall transfer level: Needs assistance Equipment used: Rolling walker (2 wheeled) Transfers: Sit to/from Stand Sit to Stand: From elevated surface;+2 safety/equipment;Mod assist         General transfer comment: Pt was able to STS 3 x EOB from elevated height. Mod assist of one with max vcs. 2nd person for safety.  Ambulation/Gait Ambulation/Gait assistance: Min assist Gait Distance (Feet): 5 Feet Assistive device: Rolling walker (2 wheeled) Gait Pattern/deviations: Step-to pattern;Decreased step length - left;Decreased stance time - right Gait velocity: decreased   General Gait Details: pt was able to take ~ 4 step fwd back and then to [facility] prior to requesting to sit. Pt fatigued quickly and required total assist to return to supine after ambulation. Increased time to take steps with max vcs for increased BOS and step quality   Stairs             Wheelchair Mobility    Modified Rankin (Stroke Patients Only)       Balance Overall balance assessment: Needs assistance Sitting-balance support: No upper extremity supported;Feet supported Sitting balance-Leahy Scale: Fair Sitting balance - Comments: pt sat EOB x ~ 10 minutes throughout. several times requires tactile cues to stay awake.   Standing balance support: Bilateral upper extremity supported;During functional activity Standing balance-Leahy Scale: Fair Standing balance comment: Min A for stability in standing  Cognition Arousal/Alertness: Awake/alert Behavior During Therapy: Anxious(tearful) Overall Cognitive Status: History of cognitive impairments - at baseline                                 General Comments: Pt has cognition deficits at baseline. Mostly lethargic throughout session but cooperative. Able to follow simple one step commands.       Exercises      General Comments         Pertinent Vitals/Pain Pain Assessment: (unable to rate) Pain Score: 0-No pain Faces Pain Scale: Hurts little more Pain Descriptors / Indicators: Grimacing;Moaning Pain Intervention(s): Limited activity within patient's tolerance;Monitored during session    Home Living                      Prior Function            PT Goals (current goals can now be found in the care plan section) Acute Rehab PT Goals Patient Stated Goal: no goals stated Progress towards PT goals: Progressing toward goals    Frequency    7X/week      PT Plan Current plan remains appropriate    Co-evaluation              AM-PAC PT "6 Clicks" Mobility   Outcome Measure  Help needed turning from your back to your side while in a flat bed without using bedrails?: Total Help needed moving from lying on your back to sitting on the side of a flat bed without using bedrails?: Total Help needed moving to and from a bed to a chair (including a wheelchair)?: A Lot Help needed standing up from a chair using your arms (e.g., wheelchair or bedside chair)?: A Lot Help needed to walk in hospital room?: Total Help needed climbing 3-5 steps with a railing? : Total 6 Click Score: 8    End of Session Equipment Utilized During Treatment: Gait belt Activity Tolerance: Patient tolerated treatment well Patient left: in bed;with call bell/phone within reach;with bed alarm set Nurse Communication: Mobility status PT Visit Diagnosis: Muscle weakness (generalized) (M62.81);Difficulty in walking, not elsewhere classified (R26.2);Pain Pain - Right/Left: Left Pain - part of body: Leg     Time: [id number] PT Time Calculation (min) (ACUTE ONLY): 19 min  Charges:  $Therapeutic Activity: 8-22 mins                     Julaine Fusi PTA 05/01/20, 1:09 PM

## 2020-05-02 LAB — BASIC METABOLIC PANEL
Anion gap: 5 (ref 5–15)
BUN: 11 mg/dL (ref 8–23)
CO2: 24 mmol/L (ref 22–32)
Calcium: 8.1 mg/dL — ABNORMAL LOW (ref 8.9–10.3)
Chloride: 109 mmol/L (ref 98–111)
Creatinine, Ser: 0.45 mg/dL (ref 0.44–1.00)
GFR calc Af Amer: >60 mL/min (ref >60)
GFR calc non Af Amer: >60 mL/min (ref >60)
Glucose, Bld: 96 mg/dL (ref 70–99)
Potassium: 4.1 mmol/L (ref 3.5–5.1)
Sodium: 138 mmol/L (ref 135–145)

## 2020-05-02 LAB — IRON AND TIBC
Iron: 24 ug/dL — ABNORMAL LOW (ref 28–170)
Saturation Ratios: 15 % (ref 10.4–31.8)
TIBC: 157 ug/dL — ABNORMAL LOW (ref 250–450)
UIBC: 133 ug/dL

## 2020-05-02 LAB — CBC WITH DIFFERENTIAL/PLATELET
Abs Immature Granulocytes: 0.03 10*3/uL (ref 0.00–0.07)
Basophils Absolute: 0 10*3/uL (ref 0.0–0.1)
Basophils Relative: 0 %
Eosinophils Absolute: 0.5 10*3/uL (ref 0.0–0.5)
Eosinophils Relative: 8 %
HCT: 25.1 % — ABNORMAL LOW (ref 36.0–46.0)
Hemoglobin: 8 g/dL — ABNORMAL LOW (ref 12.0–15.0)
Immature Granulocytes: 1 %
Lymphocytes Relative: 20 %
Lymphs Abs: 1.2 10*3/uL (ref 0.7–4.0)
MCH: 29.7 pg (ref 26.0–34.0)
MCHC: 31.9 g/dL (ref 30.0–36.0)
MCV: 93.3 fL (ref 80.0–100.0)
Monocytes Absolute: 0.7 10*3/uL (ref 0.1–1.0)
Monocytes Relative: 12 %
Neutro Abs: 3.6 10*3/uL (ref 1.7–7.7)
Neutrophils Relative %: 59 %
Platelets: 215 10*3/uL (ref 150–400)
RBC: 2.69 MIL/uL — ABNORMAL LOW (ref 3.87–5.11)
RDW: 16.7 % — ABNORMAL HIGH (ref 11.5–15.5)
WBC: 6.1 10*3/uL (ref 4.0–10.5)
nRBC: 0 % (ref 0.0–0.2)

## 2020-05-02 LAB — MAGNESIUM: Magnesium: 1.8 mg/dL (ref 1.7–2.4)

## 2020-05-02 LAB — VITAMIN B12: Vitamin B-12: 1060 pg/mL — ABNORMAL HIGH (ref 180–914)

## 2020-05-02 MED ORDER — TRAMADOL HCL 50 MG PO TABS: 50.0000 mg | ORAL_TABLET | Freq: Four times a day (QID) | ORAL | 0 refills | Status: AC

## 2020-05-02 MED ORDER — ENOXAPARIN SODIUM 30 MG/0.3ML ~~LOC~~ SOLN: 30.0000 mg | SUBCUTANEOUS | 0 refills | Status: AC

## 2020-05-02 MED ORDER — POLYETHYLENE GLYCOL 3350 17 G PO PACK: 17.0000 g | PACK | Freq: Every day | ORAL | 0 refills | Status: AC

## 2020-05-02 NOTE — TOC Progression Note (Signed)
Transition of Care [facility] - Progression Note    Patient Details  Name: Gabrielle Crosby MRN: [id number] Date of Birth: 1933/05/19  Transition of Care [facility]) CM/SW Contact  Shelbie Hutching, RN Phone Number: 05/02/2020, 10:12 AM  Clinical Narrative:    Repeat COVID test was negative.  Daughter would like to bring her mother home with hospice.  This RNCM has spoken with Roselyn Meier of [facility] and she can have all equipment delivered within 4 hours.  The patient's daughter reports that she needs to move furniture around before the bed can be delivered and her daughter does not get off work until [place] expressed to daughter that the patient needs to discharge today.    Expected Discharge Plan: Home w Hospice Care Barriers to Discharge: Barriers Resolved  Expected Discharge Plan and Services Expected Discharge Plan: [place] In-house Referral: Hospice / Palliative Care Discharge Planning Services: CM Consult Post Acute Care Choice: Hospice Living arrangements for the past 2 months: [place]                 DME Arranged: Hospital bed DME Agency: Other - Comment([facility]) Date DME Agency Contacted: 05/02/20 Time DME Agency Contacted: 1011 Representative spoke with at DME Agency: [place] ([place]) Interventions    Readmission Risk Interventions No flowsheet data found.

## 2020-05-02 NOTE — Discharge Instructions (Signed)
Follow-up with PCP in 1 week. Follow-up with orthopedist in 2 weeks Follow-up with hospice at home.

## 2020-05-02 NOTE — Discharge Summary (Signed)
Physician Discharge Summary  Patient ID: NHYIRA Gabrielle Crosby MRN: [id number] DOB/AGE: 05-02-1933 86 y.o.  Admit date: 04/24/2020 Discharge date: 05/02/2020  Admission Diagnoses:  Discharge Diagnoses:  Principal Problem:   Intertrochanteric fracture of left femur, closed, initial encounter [facility]) Active Problems:   Mixed Alzheimer's and vascular dementia ([place])   Essential (primary) hypertension   Fall   Intertrochanteric fracture of left femur [facility])   Discharged Condition: fair  Hospital Course:  Gabrielle Luchetti Faucetteis a 86 y.o.femalewith medical history significant foradvanced dementia,hypertension, seizures and stroke who was brought into the emergency room by EMS after an unwitnessed fall in the skilled nursing facility resulting in left hip pain.  She had a surgery performed by Dr. Rudene Christians on 5/27 with a left hip intramedullary nail.   Patient's COVID 19 test is positive but per daughter she has completed her vaccination series.  #1.  Left hip fracture.  Status post left hip surgery. Patient doing well.  Pain under control.  Prophylactic Lovenox for total course of 2 weeks (additional 8 days)  2.  Dementia secondary to vascular dementia and Alzheimer disease. Patient still has significant confusion.  Continue home medicines.  3.  Essential hypertension. Resume home medicine  4.  Covid infection. No hypoxemia.    Repeated Covid PCR negative.  5.  Anemia.  Blood loss anemia and chronic anemia. B12 normal, mild iron deficiency.  6.  Hypokalemia. Resolved.  Patient will be discharged home under hospice care.   Consults: orthopedic surgery  Significant Diagnostic Studies:  CT ANGIOGRAPHY CHEST WITH CONTRAST  TECHNIQUE: Multidetector CT imaging of the chest was performed using the standard protocol during bolus administration of intravenous contrast. Multiplanar CT image reconstructions and MIPs were obtained to evaluate the vascular anatomy.  CONTRAST:  39mL  OMNIPAQUE IOHEXOL 350 MG/ML SOLN  COMPARISON:  None.  FINDINGS: Cardiovascular: Satisfactory opacification of pulmonary arteries noted, and no pulmonary emboli identified. No evidence of thoracic aortic dissection or aneurysm. Aortic and coronary artery atherosclerosis noted.  Mediastinum/Nodes: No masses or pathologically enlarged lymph nodes identified.  Lungs/Pleura: Mild dependent atelectasis seen in the lung bases. Tiny left pleural effusion also noted. No evidence of pulmonary consolidation or mass.  Upper abdomen: No acute findings.  Musculoskeletal: No suspicious bone lesions identified.  Review of the MIP images confirms the above findings.  IMPRESSION: 1. No evidence of pulmonary embolism. 2. Mild bibasilar atelectasis and tiny left pleural effusion.  Aortic Atherosclerosis (ICD10-I70.0).   Electronically Signed   By: Marlaine Hind M.D.   On: 04/27/2020 17:24  OPERATIVE LEFT HIP (WITH PELVIS IF PERFORMED) 2 VIEWS  TECHNIQUE: Fluoroscopic spot image(s) were submitted for interpretation post-operatively.  COMPARISON:  04/24/2020  FINDINGS: 4 fluoroscopic images are obtained during the performance of the procedure and are provided for interpretation only. Intramedullary rod with proximal dynamic and distal interlocking screws identified, with near anatomic alignment.  FLUOROSCOPY TIME:  1 minutes 19 seconds  IMPRESSION: 1. ORIF of an intertrochanteric left hip fracture as above.   Electronically Signed   By: Randa Ngo M.D.   On: 04/25/2020 19:43  Treatments: Left hip medullary nail.  Discharge Exam: Blood pressure (!) 100/41, pulse 71, temperature (!) 97.4 F (36.3 C), temperature source Oral, resp. rate 14, height 5\' 1"  (1.549 m), weight 39.9 kg, SpO2 98 %. General appearance: alert and cooperative Resp: clear to auscultation bilaterally Cardio: regular rate and rhythm, S1, S2 normal, no murmur, click, rub or gallop GI:  soft, non-tender; bowel sounds normal; no masses,  no  organomegaly Extremities: extremities normal, atraumatic, no cyanosis or edema   Alert and oriented to herself only.  Disposition: Discharge disposition: 50-Hospice/Home       Discharge Instructions    Diet - low sodium heart healthy   Complete by: As directed    Increase activity slowly   Complete by: As directed      Allergies as of 05/02/2020      Reactions   Atenolol Other (See Comments)   Bradycardia    Donepezil Hcl Other (See Comments)   Mental changes, "made me crazy"      Medication List    TAKE these medications   acetaminophen 500 MG tablet Commonly known as: TYLENOL Take 500 mg by mouth 2 (two) times daily.   aspirin EC 325 MG tablet Aspir-Trin oral 325             mg 1 tablet DAILY CVA HX   atorvastatin 40 MG tablet Commonly known as: LIPITOR Take by mouth.   bisacodyl 10 MG suppository Commonly known as: DULCOLAX Place 1 suppository (10 mg total) rectally daily.   citalopram 20 MG tablet Commonly known as: CELEXA Take 20 mg by mouth daily.   clonazePAM 0.5 MG tablet Commonly known as: KLONOPIN Take 0.5 mg by mouth 2 (two) times daily.   enoxaparin 30 MG/0.3ML injection Commonly known as: LOVENOX Inject 0.3 mLs (30 mg total) into the skin daily for 8 days. Start taking on: May 03, 2020   fluticasone 50 MCG/ACT nasal spray Commonly known as: FLONASE Place into the nose.   lactose free nutrition Liqd Take 237 mLs by mouth 3 (three) times daily between meals. Vanilla   lactulose 10 GM/15ML solution Commonly known as: [place] Take by mouth daily as needed for mild constipation. Take 30 mLs   lisinopril 5 MG tablet Commonly known as: ZESTRIL Take 1 tablet (5 mg total) by mouth daily.   melatonin 3 MG Tabs tablet Take by mouth.   memantine 10 MG tablet Commonly known as: NAMENDA Take 10 mg by mouth 2 (two) times daily.   ondansetron 4 MG tablet Commonly known as: Zofran Take  1 tablet (4 mg total) by mouth daily as needed for nausea or vomiting.   polyethylene glycol 17 g packet Commonly known as: MIRALAX / GLYCOLAX Take 17 g by mouth daily.   QUEtiapine 50 MG tablet Commonly known as: SEROQUEL Take 50 mg by mouth at bedtime.   QUEtiapine 25 MG tablet Commonly known as: SEROQUEL Take 25 mg by mouth daily.   traMADol 50 MG tablet Commonly known as: ULTRAM Take 1 tablet (50 mg total) by mouth every 6 (six) hours.   traZODone 50 MG tablet Commonly known as: DESYREL Take 50 mg by mouth at bedtime.   vitamin B-12 1000 MCG tablet Commonly known as: CYANOCOBALAMIN Take 1,000 mcg by mouth daily.   Vitamin D 50 MCG (2000 UT) tablet Take 2,000 Units by mouth daily.      Follow-up Information    Toni Arthurs, NP Follow up in 1 week(s).   Specialty: Family Medicine Contact information: [place] [id number] [phone]        Duanne Guess, PA-C Follow up in 2 week(s).   Specialties: Orthopedic Surgery, Emergency Medicine Contact information: [place] [address] [phone]          42 minutes Signed: Sharen Hones 05/02/2020, 12:11 PM

## 2020-05-02 NOTE — Progress Notes (Signed)
NP Randol Kern made aware that BP currently 108/50 (66) will continue to monitor BP

## 2020-05-02 NOTE — TOC Transition Note (Signed)
Transition of Care [facility]) - CM/SW Discharge Note   Patient Details  Name: Gabrielle Crosby MRN: [id number] Date of Birth: [birth year]  Transition of Care [facility]) CM/SW Contact:  Shelbie Hutching, RN Phone Number: 05/02/2020, 1:46 PM   Clinical Narrative:    Patient is ready for discharge home with hospice.  Patient's family has chosen [facility].  Melissa Been with [facility] is arranging equipment delivery.  Patient will transport via EMS once the hospital bed has been delivered to the home.     Final next level of care: Home w Hospice Care Barriers to Discharge: Barriers Resolved   Patient Goals and CMS Choice Patient states their goals for this hospitalization and ongoing recovery are:: Patient's daughter would like to take the patient home with hospice CMS Medicare.gov Compare Post Acute Care list provided to:: Patient Represenative (must comment) Choice offered to / list presented to : Adult Children  Discharge Placement                Patient to be transferred to facility by: [facility] to transport patient home Name of family member notified: [name] Patient and family notified of of transfer: 05/02/20  Discharge Plan and Services In-house Referral: Hospice / Palliative Care Discharge Planning Services: CM Consult Post Acute Care Choice: Hospice          DME Arranged: Hospital bed DME Agency: Other - Comment([facility]) Date DME Agency Contacted: 05/02/20 Time DME Agency Contacted: 1011 Representative spoke with at [place]: [place] ([place]) Interventions     Readmission Risk Interventions No flowsheet data found.

## 2020-05-02 NOTE — TOC Progression Note (Signed)
Transition of Care [facility]) - Progression Note    Patient Details  Name: TAHARI CALI MRN: [id number] Date of Birth: [birth year]  Transition of Care [facility]) CM/SW Contact  Shelbie Hutching, RN Phone Number: 05/02/2020, 4:03 PM  Clinical Narrative:    EMS will pick patient up after 1730 today, patient's daughter reports that the hospital bed should arrive between 1630 and 1700.     Expected Discharge Plan: Home w Hospice Care Barriers to Discharge: Barriers Resolved  Expected Discharge Plan and Services Expected Discharge Plan: [place] In-house Referral: Hospice / Palliative Care Discharge Planning Services: CM Consult Post Acute Care Choice: Hospice Living arrangements for the past 2 months: [place] Expected Discharge Date: 05/02/20               DME Arranged: Hospital bed DME Agency: Other - Comment([facility]) Date DME Agency Contacted: 05/02/20 Time DME Agency Contacted: 1011 Representative spoke with at [place]: [place] ([place]) Interventions    Readmission Risk Interventions No flowsheet data found.

## 2020-05-30 DEATH — deceased

## 2020-06-17 IMAGING — CT CT HIP*R* W/O CM
2 of 3 series · 17 of 46 positions shown, 19 images · non-contrast
Comparison: None.

CLINICAL DATA: Right hip pain.

EXAM:
CT OF THE RIGHT HIP WITHOUT CONTRAST
TECHNIQUE: Multidetector CT imaging of the right hip was performed according to
the standard protocol. Multiplanar CT image reconstructions were
also generated.

[Series 3: axial st · axial · 0.41mm/px · z∈[-207,-71]mm · 14 of 80 slices shown, 16 images]
[im 6/80  soft-tissue]
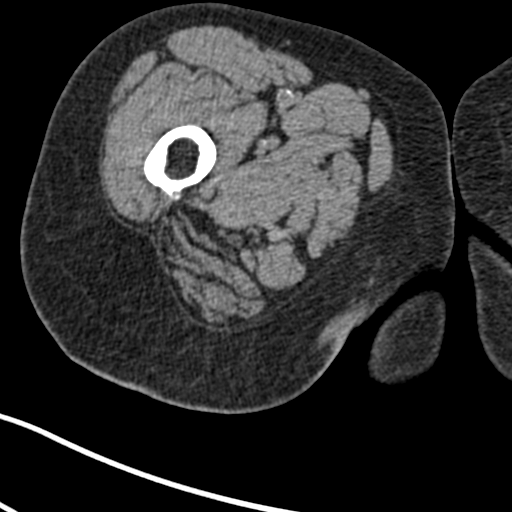
[im 6/80  bone]
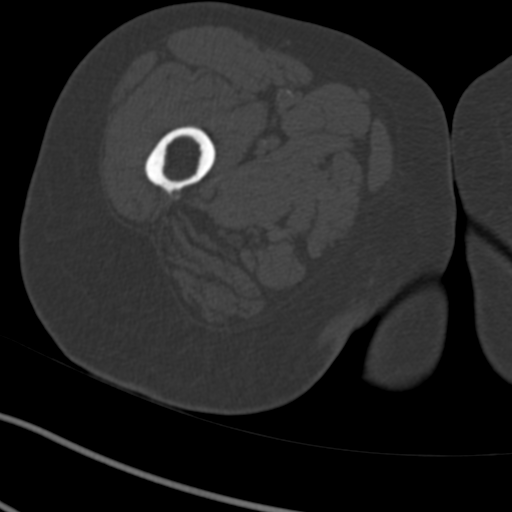
[im 11/80  soft-tissue]
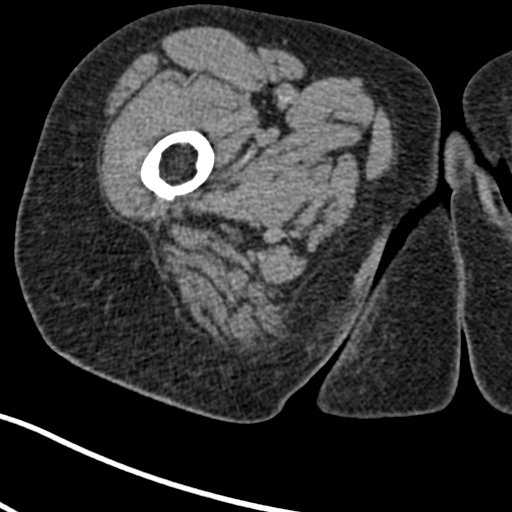
[im 16/80  soft-tissue]
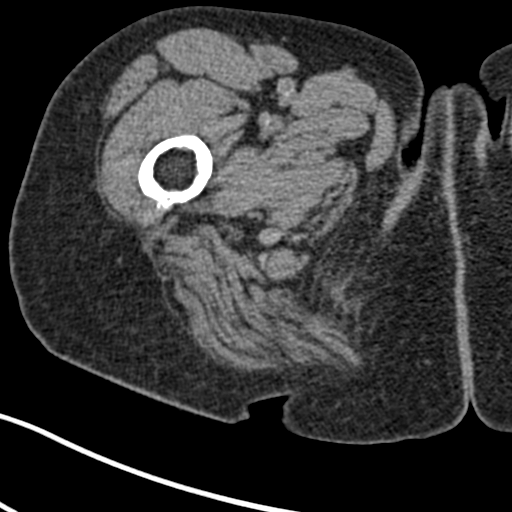
[im 21/80  soft-tissue]
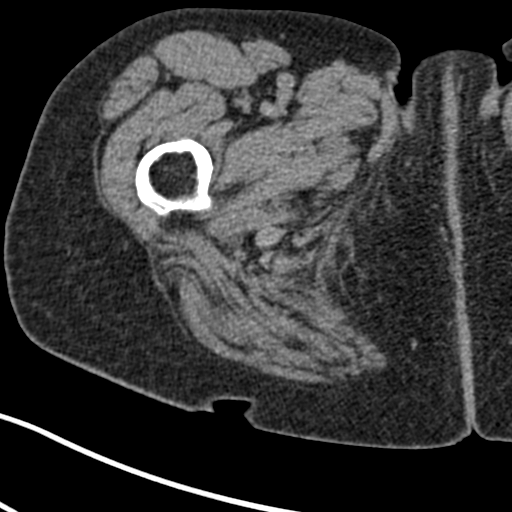
[im 26/80  soft-tissue]
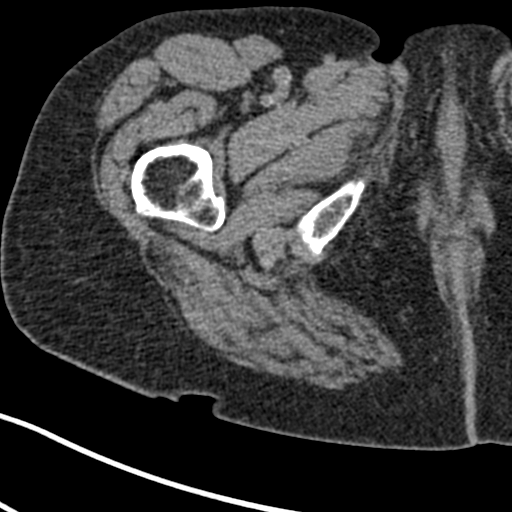
[im 31/80  soft-tissue]
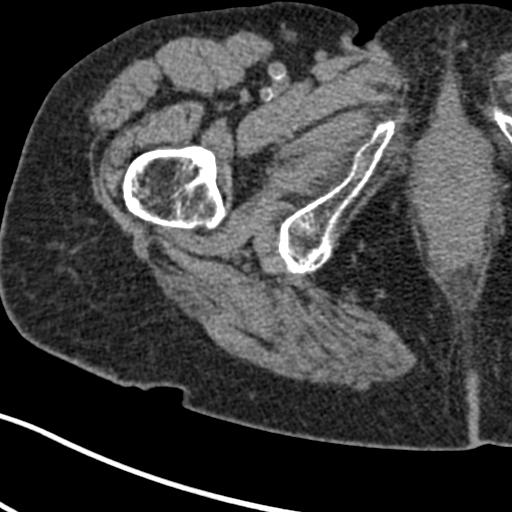
[im 36/80  soft-tissue]
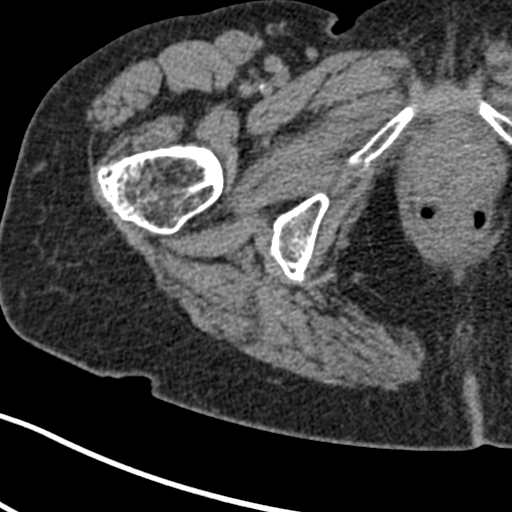
[im 44/80  soft-tissue]
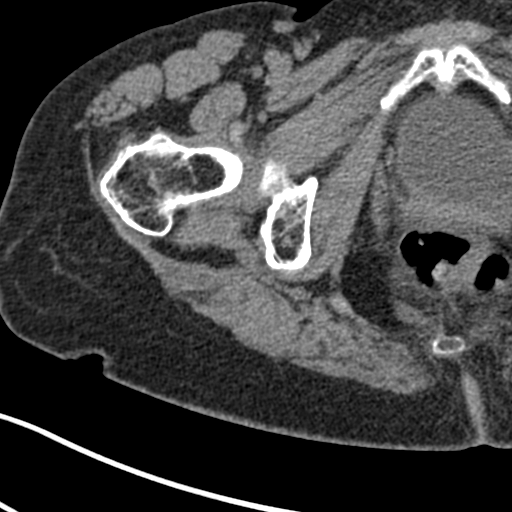
[im 49/80  soft-tissue]
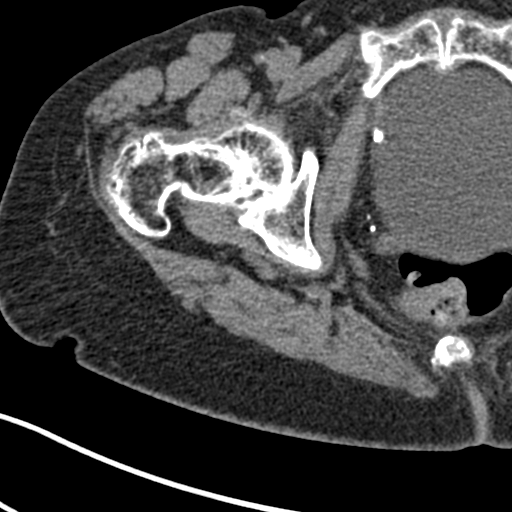
[im 49/80  bone]
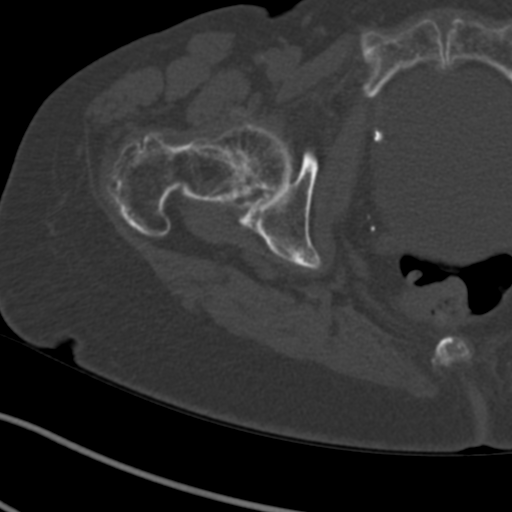
[im 54/80  soft-tissue]
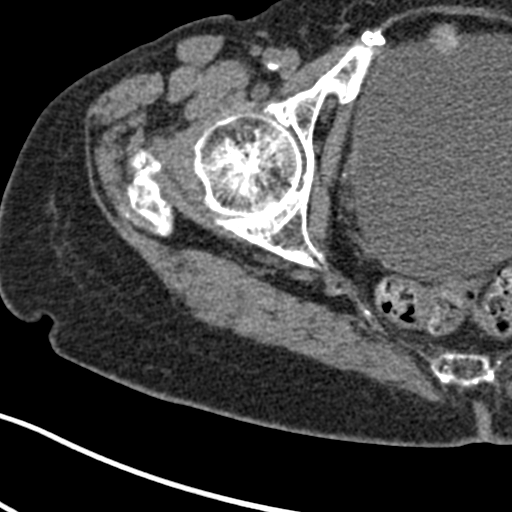
[im 59/80  soft-tissue]
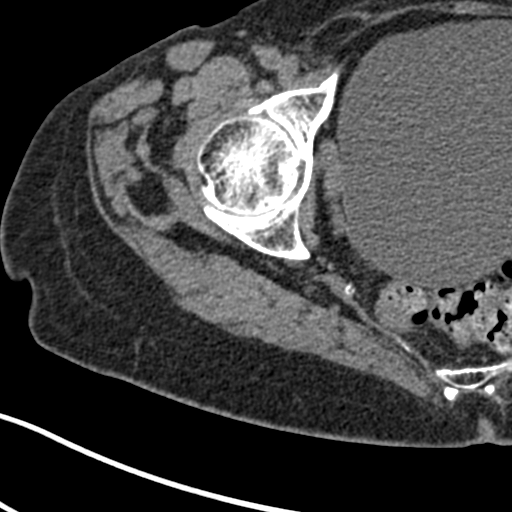
[im 64/80  soft-tissue]
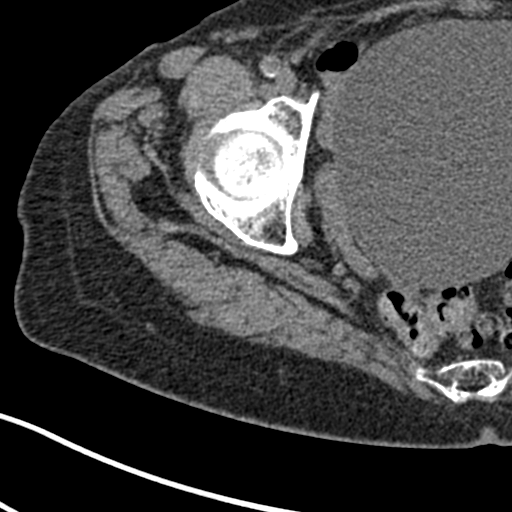
[im 69/80  soft-tissue]
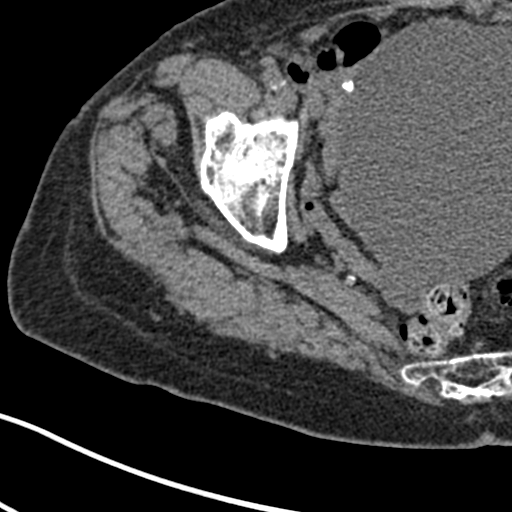
[im 74/80  soft-tissue]
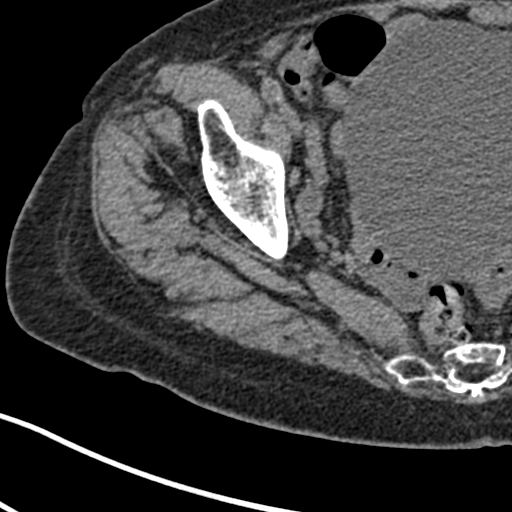

[Series 6: coronal st · coronal · 0.34mm/px · 3 of 90 slices shown]
[im 30/90  soft-tissue]
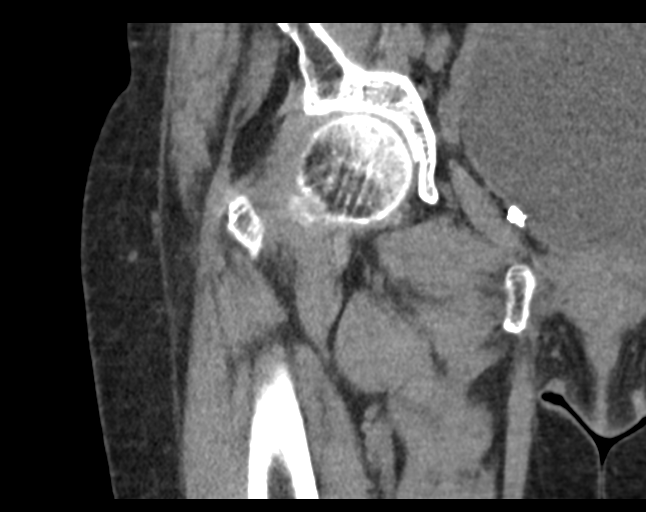
[im 40/90  soft-tissue]
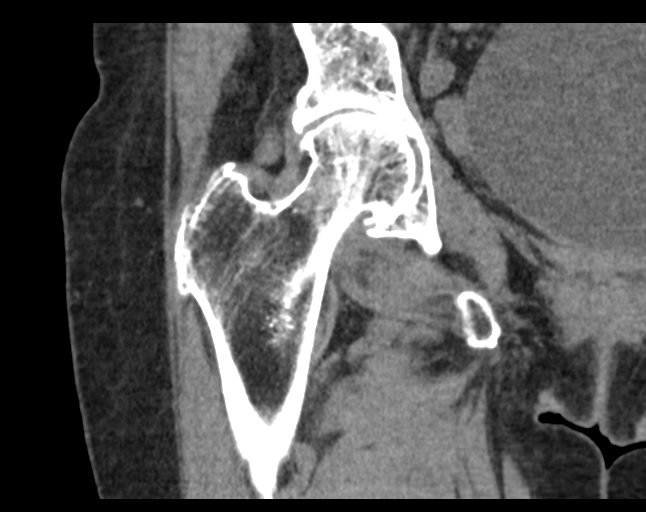
[im 50/90  soft-tissue]
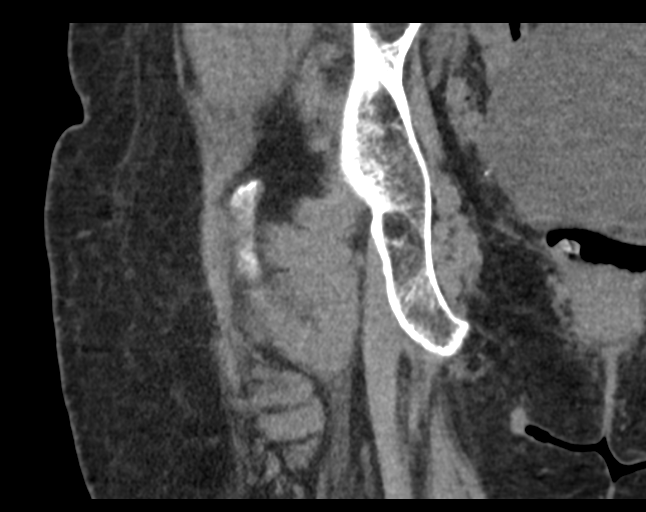

[17 of 46 positions shown; findings below may reference images not displayed]

FINDINGS: Bones/Joint/Cartilage

Generalized osteopenia. No right femur or acetabular fracture.
Nondisplaced fracture of the right inferior pubic ramus. Possible
nondisplaced fracture of the right superior pubic ramus. Normal
alignment. No joint effusion.

Moderate right hip joint space narrowing with marginal osteophytes
consistent with osteoarthritis.

Ligaments

Ligaments are suboptimally evaluated by CT.

Muscles and Tendons

Soft tissue
No fluid collection or hematoma.  No soft tissue mass.
IMPRESSION: 1. Nondisplaced fracture of the right inferior pubic ramus. Possible
nondisplaced fracture of the right superior pubic ramus.
2. No right hip fracture or dislocation.
